# Patient Record
Sex: Male | Born: 1955 | Race: Black or African American | Hispanic: No | Marital: Married | Smoking: Never smoker
Health system: Southern US, Community
[De-identification: ages and names within clinical notes are randomized; demographics above are authoritative.]

## PROBLEM LIST (undated history)

## (undated) DIAGNOSIS — I10 Essential (primary) hypertension: Secondary | ICD-10-CM

## (undated) DIAGNOSIS — E785 Hyperlipidemia, unspecified: Secondary | ICD-10-CM

## (undated) DIAGNOSIS — T7840XA Allergy, unspecified, initial encounter: Secondary | ICD-10-CM

## (undated) DIAGNOSIS — R7303 Prediabetes: Secondary | ICD-10-CM

## (undated) DIAGNOSIS — G4730 Sleep apnea, unspecified: Secondary | ICD-10-CM

## (undated) HISTORY — PX: TOOTH EXTRACTION: SUR596

## (undated) HISTORY — DX: Essential (primary) hypertension: I10

## (undated) HISTORY — DX: Allergy, unspecified, initial encounter: T78.40XA

## (undated) HISTORY — DX: Prediabetes: R73.03

## (undated) HISTORY — PX: APPENDECTOMY: SHX54

## (undated) HISTORY — DX: Hyperlipidemia, unspecified: E78.5

## (undated) HISTORY — PX: VASECTOMY: SHX75

## (undated) HISTORY — DX: Sleep apnea, unspecified: G47.30

---

## 2003-06-21 ENCOUNTER — Ambulatory Visit (HOSPITAL_BASED_OUTPATIENT_CLINIC_OR_DEPARTMENT_OTHER): Admission: RE | Admit: 2003-06-21 | Discharge: 2003-06-21 | Payer: Self-pay | Admitting: Internal Medicine

## 2003-07-30 ENCOUNTER — Ambulatory Visit (HOSPITAL_BASED_OUTPATIENT_CLINIC_OR_DEPARTMENT_OTHER): Admission: RE | Admit: 2003-07-30 | Discharge: 2003-07-30 | Payer: Self-pay | Admitting: Internal Medicine

## 2005-11-01 ENCOUNTER — Encounter: Admission: RE | Admit: 2005-11-01 | Discharge: 2006-01-30 | Payer: Self-pay | Admitting: Gastroenterology

## 2008-11-29 ENCOUNTER — Ambulatory Visit: Payer: Self-pay | Admitting: Internal Medicine

## 2008-11-29 DIAGNOSIS — R0602 Shortness of breath: Secondary | ICD-10-CM | POA: Insufficient documentation

## 2008-11-29 DIAGNOSIS — G4730 Sleep apnea, unspecified: Secondary | ICD-10-CM | POA: Insufficient documentation

## 2008-11-29 LAB — CONVERTED CEMR LAB
Angiotensin 1 Converting Enzyme: 20 units/L (ref 9–67)
Basophils Relative: 0.4 % (ref 0.0–3.0)
Hemoglobin: 15.6 g/dL (ref 13.0–17.0)
Lymphocytes Relative: 36.5 % (ref 12.0–46.0)
Monocytes Relative: 6.7 % (ref 3.0–12.0)
Neutro Abs: 3.4 10*3/uL (ref 1.4–7.7)
RBC: 5.21 M/uL (ref 4.22–5.81)

## 2008-12-07 ENCOUNTER — Ambulatory Visit: Payer: Self-pay | Admitting: Internal Medicine

## 2008-12-09 ENCOUNTER — Ambulatory Visit (HOSPITAL_COMMUNITY): Admission: RE | Admit: 2008-12-09 | Discharge: 2008-12-09 | Payer: Self-pay | Admitting: Internal Medicine

## 2008-12-14 DIAGNOSIS — J309 Allergic rhinitis, unspecified: Secondary | ICD-10-CM | POA: Insufficient documentation

## 2008-12-14 DIAGNOSIS — E785 Hyperlipidemia, unspecified: Secondary | ICD-10-CM | POA: Insufficient documentation

## 2009-01-05 ENCOUNTER — Ambulatory Visit: Payer: Self-pay | Admitting: Internal Medicine

## 2009-01-23 DIAGNOSIS — K219 Gastro-esophageal reflux disease without esophagitis: Secondary | ICD-10-CM | POA: Insufficient documentation

## 2012-02-09 ENCOUNTER — Ambulatory Visit: Payer: Managed Care, Other (non HMO) | Admitting: Internal Medicine

## 2012-02-09 ENCOUNTER — Ambulatory Visit: Payer: Managed Care, Other (non HMO)

## 2012-02-09 VITALS — BP 153/80 | HR 84 | Temp 98.6°F | Resp 16 | Ht 74.0 in | Wt 272.4 lb

## 2012-02-09 DIAGNOSIS — B029 Zoster without complications: Secondary | ICD-10-CM

## 2012-02-09 MED ORDER — VALACYCLOVIR HCL 1 G PO TABS: 1000.0000 mg | ORAL_TABLET | Freq: Three times a day (TID) | ORAL | Status: DC

## 2012-02-09 NOTE — Progress Notes (Signed)
  Subjective:    Patient ID: Dustin Mckee, male    DOB: [birth year], 56 y.o.   MRN: [id number]  HPI2 day history of a painful rash in the right axillary area No known exposure or injury Positive chickenpox in childhood    Review of SystemsOn Lipitor/no history of hypertension     Objective:   Physical ExamBlood pressure 153/80 Skin reveals a vesicular erythematous rash that is tender to touch in the T5 or T6 dermatome This is mainly in the axillary line but skin has slight hypersensitivity throughout the dermatome There is a small lipoma anteriorly in this dermatome        Assessment & Plan:  Problem #1 shingles Valtrex 1 g 3 times a day for 10 days Tylenol or ibuprofen Call if worse  Problem #2 mild hypertension Recheck when well

## 2012-02-11 ENCOUNTER — Telehealth: Payer: Self-pay

## 2012-02-11 MED ORDER — VALACYCLOVIR HCL 1 G PO TABS: 1000.0000 mg | ORAL_TABLET | Freq: Three times a day (TID) | ORAL | Status: AC

## 2012-02-11 NOTE — Telephone Encounter (Signed)
Pt left Valtrex at home and will be out of town for a couple of days would like to have enough to get him through til he get back.    [place], [place] [phone]

## 2012-02-11 NOTE — Telephone Encounter (Signed)
ADVISED PT THAT RX WAS SENT IN 

## 2012-02-11 NOTE — Telephone Encounter (Signed)
Done, and sent to the pharmacy  Alycia Rossetti

## 2012-02-11 NOTE — Telephone Encounter (Signed)
Patient was in over the weekend and was diagnosed with shingles. He is out of town and left his meds at home. He would like to know if he could have enough meds called in to last him for the next two days.

## 2012-06-27 ENCOUNTER — Ambulatory Visit: Payer: Managed Care, Other (non HMO) | Admitting: Emergency Medicine

## 2012-06-27 VITALS — BP 125/79 | HR 73 | Temp 98.5°F | Resp 17 | Ht 74.0 in | Wt 272.0 lb

## 2012-06-27 DIAGNOSIS — A630 Anogenital (venereal) warts: Secondary | ICD-10-CM

## 2012-06-27 NOTE — Progress Notes (Signed)
   Date:  06/27/2012   Name:  Dustin Mckee   DOB:  1956-07-08   MRN:  [id number] Gender: male  Age: 56 y.o.  PCP:  Sissy Hoff, MD    Chief Complaint: Mass   History of Present Illness:  Dustin Mckee is a 56 y.o. pleasant patient who presents with the following:  Long history (years) of growth on his perianal area on right buttock.  Has enlarged recently.  Now having pain.  Denies STD or history of possible exposure.  No history of discharge, dysuria, vesicular eruption.  No lymphadenopathy.  Denies blood in stool or black stool.  Patient Active Problem List  Diagnosis  . HYPERLIPIDEMIA  . ALLERGIC RHINITIS  . GERD  . SLEEP APNEA  . DYSPNEA    No past medical history on file.  No past surgical history on file.  History  Substance Use Topics  . Smoking status: Never Smoker   . Smokeless tobacco: Not on file  . Alcohol Use: Not on file    No family history on file.  Allergies  Allergen Reactions  . Iodine Hives    Medication list has been reviewed and updated.  Current Outpatient Prescriptions on File Prior to Visit  Medication Sig Dispense Refill  . rosuvastatin (CRESTOR) 20 MG tablet Take 20 mg by mouth daily.      Marland Kitchen atorvastatin (LIPITOR) 10 MG tablet Take 10 mg by mouth daily.      . valACYclovir (VALTREX) 1000 MG tablet Take 1 tablet (1,000 mg total) by mouth 3 (three) times daily.  30 tablet  0    Review of Systems:  As per HPI, otherwise negative.    Physical Examination: Filed Vitals:   06/27/12 1447  BP: 125/79  Pulse: 73  Temp: 98.5 F (36.9 C)  Resp: 17   Filed Vitals:   06/27/12 1447  Height: 6\' 2"  (1.88 m)  Weight: 272 lb (123.378 kg)   Body mass index is 34.92 kg/(m^2). Ideal Body Weight: Weight in (lb) to have BMI = 25: 194.3    GEN: WDWN, NAD, Non-toxic, Alert & Oriented x 3 HEENT: Atraumatic, Normocephalic.  Ears and Nose: No external deformity. EXTR: No clubbing/cyanosis/edema NEURO: Normal gait.  PSYCH: Normally  interactive. Conversant. Not depressed or anxious appearing.  Calm demeanor.  Genitalia normal male circumcised Buttocks:  Two lesions one flat and second raised like a cauliflower on right buttock several centimeters from anus.  Not tender.    Assessment and Plan:  Condyloma accuminata   Carmelina Dane, MD

## 2013-02-25 ENCOUNTER — Ambulatory Visit: Payer: BC Managed Care – PPO | Admitting: Family Medicine

## 2013-02-25 VITALS — BP 142/84 | HR 76 | Temp 98.3°F | Resp 16 | Ht 74.0 in | Wt 277.0 lb

## 2013-02-25 DIAGNOSIS — R202 Paresthesia of skin: Secondary | ICD-10-CM

## 2013-02-25 DIAGNOSIS — IMO0001 Reserved for inherently not codable concepts without codable children: Secondary | ICD-10-CM

## 2013-02-25 DIAGNOSIS — R209 Unspecified disturbances of skin sensation: Secondary | ICD-10-CM

## 2013-02-25 DIAGNOSIS — R030 Elevated blood-pressure reading, without diagnosis of hypertension: Secondary | ICD-10-CM

## 2013-02-25 DIAGNOSIS — R351 Nocturia: Secondary | ICD-10-CM

## 2013-02-25 LAB — POCT GLYCOSYLATED HEMOGLOBIN (HGB A1C): Hemoglobin A1C: 5.6

## 2013-02-25 LAB — POCT URINALYSIS DIPSTICK
Leukocytes, UA: NEGATIVE
Protein, UA: NEGATIVE
Urobilinogen, UA: 0.2
pH, UA: 5.5

## 2013-02-25 LAB — GLUCOSE, POCT (MANUAL RESULT ENTRY): POC Glucose: 78 mg/dl (ref 70–99)

## 2013-02-25 LAB — POCT UA - MICROSCOPIC ONLY
Casts, Ur, LPF, POC: NEGATIVE
Mucus, UA: NEGATIVE
Yeast, UA: NEGATIVE

## 2013-02-25 NOTE — Patient Instructions (Addendum)
1.  Check blood pressure once weekly.  Return for blood pressure consistently > 140/90. 2.  Recommend taking Aleve 2-3 times daily for next week.   3.  Return if urinating at night continues.   4.  If tingling in fingers continues, recommend purchasing wrist splint to wear at bedtime for carpal tunnel syndrome.

## 2013-02-25 NOTE — Progress Notes (Signed)
[address]   [place], [place]  [address]   [phone]  Subjective:    Patient ID: Dustin Mckee, male    DOB: 1956/02/08, 57 y.o.   MRN: [id number]  HPI This 57 y.o. male presents for evaluation of the following:    1.  R hand numbness, tingling in fingertips:  Awoke this morning with tingling.  Improved throughout the day.  Mostly thumb, 2nd and 3rd fingers.  4th finger slightly numb.  No weakness.  No neck pain.  No shoulder pain.  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  No nihgttime awakening.  No similar symptoms.    2.  Nocturia: awoke several times last night to urinate; father diabetic.  Nocturia x 4; nocturia x 2 is baseline.  No dysuria, frequency, urgency.  Urinary stream is normal.  No hestinancy.  History of BPH.  No penile discharge; no concerns for STDs; married. Did drink more last night before bedtime.   Last physical Spring 2013.    3.  Blood pressure elevated: baseline  BP runs  120s/80s.  Denies headache, dizziness, blurred vision.  Feels well.    Review of Systems  Constitutional: Negative for fever, chills, diaphoresis and fatigue.  Respiratory: Negative for shortness of breath.   Cardiovascular: Negative for chest pain, palpitations and leg swelling.  Endocrine: Negative for cold intolerance, heat intolerance, polydipsia, polyphagia and polyuria.  Genitourinary: Negative for dysuria, urgency, frequency, hematuria, flank pain, decreased urine volume, discharge, penile swelling, scrotal swelling, difficulty urinating, genital sores, penile pain and testicular pain.  Musculoskeletal: Negative for myalgias, back pain and arthralgias.  Neurological: Positive for numbness. Negative for dizziness, tremors, seizures, syncope, facial asymmetry, speech difficulty, weakness, light-headedness and headaches.        Past Medical History  Diagnosis Date  . Hyperlipidemia   . Allergy     Past Surgical History  Procedure Laterality Date  .  Appendectomy      Prior to Admission medications   Not on File    Allergies  Allergen Reactions  . Iodine Hives    History   Social History  . Marital Status: Married    Spouse Name: N/A    Number of Children: N/A  . Years of Education: N/A   Occupational History  . Not on file.   Social History Main Topics  . Smoking status: Never Smoker   . Smokeless tobacco: Not on file  . Alcohol Use: Not on file  . Drug Use: Not on file  . Sexually Active: Yes    Birth Control/ Protection: None   Other Topics Concern  . Not on file   Social History Narrative  . No narrative on file    Family History  Problem Relation Age of Onset  . Heart disease Mother   . Kidney disease Mother   . Heart disease Father     defibrillator/CHF.  . Diabetes Father   . Heart disease Sister     CHF    Objective:   Physical Exam  Nursing note and vitals reviewed. Constitutional: He is oriented to person, place, and time. He appears well-developed and well-nourished. No distress.  Eyes: Conjunctivae and EOM are normal. Pupils are equal, round, and reactive to light.  Neck: Normal range of motion. Neck supple.  Cardiovascular: Normal rate, regular rhythm and normal heart sounds.   Pulmonary/Chest: Effort normal and breath sounds normal. He has no wheezes. He has no rales.  Abdominal: Soft. Bowel sounds  are normal. He exhibits no distension. There is no tenderness. There is no rebound and no guarding.  Musculoskeletal:       Right wrist: Normal.       Cervical back: Normal. He exhibits normal range of motion, no tenderness, no bony tenderness, no pain and no spasm.       Right hand: Normal.  Tinel and Phalen's negative.  Neurological: He is alert and oriented to person, place, and time. No cranial nerve deficit. He exhibits normal muscle tone. Coordination normal.  Skin: Skin is warm and dry. No rash noted. He is not diaphoretic.  Psychiatric: He has a normal mood and affect. His behavior  is normal. Judgment and thought content normal.   Results for orders placed in visit on 02/25/13  GLUCOSE, POCT (MANUAL RESULT ENTRY)      Result Value Range   POC Glucose 78  70 - 99 mg/dl  POCT GLYCOSYLATED HEMOGLOBIN (HGB A1C)      Result Value Range   Hemoglobin A1C 5.6    POCT URINALYSIS DIPSTICK      Result Value Range   Color, UA yellow     Clarity, UA clear     Glucose, UA neg     Bilirubin, UA neg     Ketones, UA neg     Spec Grav, UA 1.025     Blood, UA trace     pH, UA 5.5     Protein, UA neg     Urobilinogen, UA 0.2     Nitrite, UA neg     Leukocytes, UA Negative    POCT UA - MICROSCOPIC ONLY      Result Value Range   WBC, Ur, HPF, POC 1-2     RBC, urine, microscopic 2-3     Bacteria, U Microscopic neg     Mucus, UA neg     Epithelial cells, urine per micros 0-1     Crystals, Ur, HPF, POC neg     Casts, Ur, LPF, POC neg     Yeast, UA neg         Assessment & Plan:  Nocturia - Plan: POCT glucose (manual entry), POCT glycosylated hemoglobin (Hb A1C), POCT urinalysis dipstick, POCT UA - Microscopic Only  Paresthesias - Plan: POCT glucose (manual entry), POCT glycosylated hemoglobin (Hb A1C), POCT urinalysis dipstick, POCT UA - Microscopic Only  Blood pressure elevated - Plan: POCT glucose (manual entry), POCT glycosylated hemoglobin (Hb A1C), POCT urinalysis dipstick, POCT UA - Microscopic Only    1. Nocturia:  New.  Urine negative; blood sugar normal with normal HgbA1c.   Pt declined prostate exam and PSA.  Likely secondary to excessive fluid intake.  If persists, return for PSA, prostate exam.  No risk factors for STDs.  No daytime urinary symptoms. 2.  Paresthesias R hand:  New.  Median nerve distribution.  Ddx early mild carpal tunnel syndrome versus compression neuropathy. Recommend Aleve bid-tid for next week.  If persists, recommend wrist splint qhs.  If worsens, RTC. 3.  Blood pressure elevated:  New.  Recommend checking blood pressure weekly and RTC  for BP> 140/90.  Recommend weight loss, exercise, low-salt food intake.

## 2013-07-28 ENCOUNTER — Ambulatory Visit: Payer: Managed Care, Other (non HMO) | Admitting: Family Medicine

## 2013-07-28 ENCOUNTER — Encounter: Payer: Self-pay | Admitting: Family Medicine

## 2013-07-28 VITALS — BP 138/78 | HR 65 | Temp 98.5°F | Resp 16 | Ht 74.0 in | Wt 271.0 lb

## 2013-07-28 DIAGNOSIS — R5381 Other malaise: Secondary | ICD-10-CM

## 2013-07-28 DIAGNOSIS — G4730 Sleep apnea, unspecified: Secondary | ICD-10-CM

## 2013-07-28 LAB — POCT CBC
Granulocyte percent: 55.4 %G (ref 37–80)
HCT, POC: 44.7 % (ref 43.5–53.7)
MCH, POC: 29.5 pg (ref 27–31.2)
MCV: 91.1 fL (ref 80–97)
MID (cbc): 0.4 (ref 0–0.9)
POC LYMPH PERCENT: 39 %L (ref 10–50)
Platelet Count, POC: 242 10*3/uL (ref 142–424)
RBC: 4.91 M/uL (ref 4.69–6.13)
RDW, POC: 15.2 %
WBC: 6.8 10*3/uL (ref 4.6–10.2)

## 2013-07-28 LAB — POCT URINALYSIS DIPSTICK
Glucose, UA: NEGATIVE
Ketones, UA: NEGATIVE
Spec Grav, UA: 1.025

## 2013-07-28 LAB — POCT UA - MICROSCOPIC ONLY
Crystals, Ur, HPF, POC: NEGATIVE
Mucus, UA: NEGATIVE

## 2013-07-28 NOTE — Progress Notes (Signed)
[address]   [place], [place]  [address]   [phone]  Subjective:    Patient ID: Dustin Mckee, male    DOB: 09-Jun-1956, 57 y.o.   MRN: [id number]  HPI This 57 y.o. male presents for evaluation of fatigue. Onset 2-3 months ago. Wanted to be checked for Vitamin D deficiency.  Sluggish and fatigue; no motivation to do much.  Travels a lot.  Traveling farther.  Sleeping well.  Non-restorative sleep; changed mask of CPAP.  Last sleep study eight years; weight has increased 40 pounds.  Falling asleep at church.  Bedtime 10:00pm; awakens at 6:00am.  [place]   +major stress at work overall but mood is stable.  No insomnia.   No chest pain, palpitations, SOB, leg swelling. No abdominal pain, nausea, vomiting, diarrhea, bloody stools, or black stools. No previous colonoscopy; scheduled last year and postponed. Nocturia is improved and down to once per night.   Review of Systems  Constitutional: Positive for fatigue. Negative for fever, chills, diaphoresis, activity change, appetite change and unexpected weight change.  Respiratory: Negative for cough, shortness of breath, wheezing and stridor.   Cardiovascular: Negative for chest pain, palpitations and leg swelling.  Gastrointestinal: Negative for nausea, vomiting, abdominal pain, diarrhea, constipation, blood in stool and rectal pain.  Endocrine: Negative for cold intolerance, heat intolerance, polydipsia, polyphagia and polyuria.  Genitourinary: Negative for frequency, hematuria, penile pain and testicular pain.  Musculoskeletal: Negative for myalgias, back pain, joint swelling, arthralgias and gait problem.  Skin: Negative for rash.  Neurological: Negative for dizziness, tremors, seizures, syncope, facial asymmetry, speech difficulty, weakness, light-headedness, numbness and headaches.  Hematological: Negative for adenopathy. Does not bruise/bleed easily.  Psychiatric/Behavioral: Negative for sleep disturbance  and dysphoric mood. The patient is not nervous/anxious.     Past Medical History  Diagnosis Date  . Hyperlipidemia   . Allergy     Past Surgical History  Procedure Laterality Date  . Appendectomy    . Tooth extraction      Prior to Admission medications   Medication Sig Start Date End Date Taking? Authorizing Provider  AMOXICILLIN PO Take by mouth.   Yes Historical Provider, MD  fish oil-omega-3 fatty acids 1000 MG capsule Take by mouth daily.   Yes Historical Provider, MD    Allergies  Allergen Reactions  . Iodine Hives    History   Social History  . Marital Status: Married    Spouse Name: N/A    Number of Children: N/A  . Years of Education: N/A   Occupational History  . Not on file.   Social History Main Topics  . Smoking status: Never Smoker   . Smokeless tobacco: Not on file  . Alcohol Use: Not on file  . Drug Use: Not on file  . Sexual Activity: Yes    Birth Control/ Protection: None   Other Topics Concern  . Not on file   Social History Narrative  . No narrative on file    Family History  Problem Relation Age of Onset  . Heart disease Mother   . Kidney disease Mother   . Heart disease Father     defibrillator/CHF.  . Diabetes Father   . Heart disease Sister     CHF       Objective:   Physical Exam  Nursing note and vitals reviewed. Constitutional: He is oriented to person, place, and time. He appears well-developed and well-nourished. No distress.  HENT:  Head: Normocephalic and  atraumatic.  Right Ear: External ear normal.  Left Ear: External ear normal.  Nose: Nose normal.  Mouth/Throat: Oropharynx is clear and moist.  Eyes: Conjunctivae and EOM are normal. Pupils are equal, round, and reactive to light.  Neck: Normal range of motion. Neck supple. No thyromegaly present.  Cardiovascular: Normal rate, regular rhythm, normal heart sounds and intact distal pulses.  Exam reveals no gallop and no friction rub.   No murmur  heard. Pulmonary/Chest: Effort normal and breath sounds normal. He has no wheezes. He has no rales.  Abdominal: Soft. Bowel sounds are normal. He exhibits no distension and no mass. There is no tenderness. There is no rebound and no guarding.  Lymphadenopathy:    He has no cervical adenopathy.  Neurological: He is alert and oriented to person, place, and time. No cranial nerve deficit. He exhibits normal muscle tone. Coordination normal.  Skin: Skin is warm and dry. No rash noted. He is not diaphoretic.  Psychiatric: He has a normal mood and affect. His behavior is normal. Judgment and thought content normal.   Results for orders placed in visit on 07/28/13  POCT CBC      Result Value Range   WBC 6.8  4.6 - 10.2 K/uL   Lymph, poc 2.7  0.6 - 3.4   POC LYMPH PERCENT 39.0  10 - 50 %L   MID (cbc) 0.4  0 - 0.9   POC MID % 5.6  0 - 12 %M   POC Granulocyte 3.8  2 - 6.9   Granulocyte percent 55.4  37 - 80 %G   RBC 4.91  4.69 - 6.13 M/uL   Hemoglobin 14.5  14.1 - 18.1 g/dL   HCT, POC 16.1  09.6 - 53.7 %   MCV 91.1  80 - 97 fL   MCH, POC 29.5  27 - 31.2 pg   MCHC 32.4  31.8 - 35.4 g/dL   RDW, POC 04.5     Platelet Count, POC 242  142 - 424 K/uL   MPV 8.1  0 - 99.8 fL  POCT URINALYSIS DIPSTICK      Result Value Range   Color, UA yellow     Clarity, UA clear     Glucose, UA neg     Bilirubin, UA neg     Ketones, UA neg     Spec Grav, UA 1.025     Blood, UA trace     pH, UA 6.0     Protein, UA neg     Urobilinogen, UA 0.2     Nitrite, UA neg     Leukocytes, UA Negative    POCT UA - MICROSCOPIC ONLY      Result Value Range   WBC, Ur, HPF, POC 0-1     RBC, urine, microscopic 0-2     Bacteria, U Microscopic trace     Mucus, UA neg     Epithelial cells, urine per micros 0-1     Crystals, Ur, HPF, POC neg     Casts, Ur, LPF, POC neg     Yeast, UA neg     EKG:  Sinus bradycardia at 58; no ST changes.    Assessment & Plan:  Other malaise and fatigue - Plan: EKG 12-Lead, POCT CBC,  POCT urinalysis dipstick, POCT UA - Microscopic Only, Comprehensive metabolic panel, TSH, Vit D  25 hydroxy (rtn osteoporosis monitoring), Vitamin B12  1.  Fatigue:  New onset.  No associated symptoms other than increasing work stressors  and increasing travel with work. Obtain labs.  If labs normal, consider CPAP sleep study to evaluate appropriate pressure of CPAP machine; last sleep study 8 years ago.  Patient expressed understanding.

## 2013-07-28 NOTE — Patient Instructions (Addendum)

## 2013-07-29 LAB — COMPREHENSIVE METABOLIC PANEL
Alkaline Phosphatase: 50 U/L (ref 39–117)
BUN: 20 mg/dL (ref 6–23)
CO2: 30 mEq/L (ref 19–32)
Creat: 1.35 mg/dL (ref 0.50–1.35)
Glucose, Bld: 84 mg/dL (ref 70–99)
Total Bilirubin: 0.4 mg/dL (ref 0.3–1.2)
Total Protein: 7.5 g/dL (ref 6.0–8.3)

## 2013-07-30 LAB — TSH: TSH: 2.75 u[IU]/mL (ref 0.350–4.500)

## 2013-07-30 LAB — VITAMIN D 25 HYDROXY (VIT D DEFICIENCY, FRACTURES): Vit D, 25-Hydroxy: 18 ng/mL — ABNORMAL LOW (ref 30–89)

## 2013-07-30 LAB — VITAMIN B12: Vitamin B-12: 699 pg/mL (ref 211–911)

## 2013-08-07 ENCOUNTER — Telehealth: Payer: Self-pay

## 2013-08-07 NOTE — Telephone Encounter (Signed)
Pt is calling for lab results 

## 2014-01-30 ENCOUNTER — Encounter (HOSPITAL_COMMUNITY): Payer: Self-pay | Admitting: Emergency Medicine

## 2014-01-30 ENCOUNTER — Emergency Department (HOSPITAL_COMMUNITY)
Admission: EM | Admit: 2014-01-30 | Discharge: 2014-01-30 | Disposition: A | Discharge status: Home / Self Care | Payer: Managed Care, Other (non HMO) | Source: Home / Self Care | Attending: Family Medicine | Admitting: Family Medicine

## 2014-01-30 DIAGNOSIS — J309 Allergic rhinitis, unspecified: Secondary | ICD-10-CM

## 2014-01-30 DIAGNOSIS — J302 Other seasonal allergic rhinitis: Secondary | ICD-10-CM

## 2014-01-30 MED ORDER — HYDROCOD POLST-CHLORPHEN POLST 10-8 MG/5ML PO LQCR: 5.0000 mL | Freq: Two times a day (BID) | ORAL | Status: DC | PRN

## 2014-01-30 MED ORDER — FLUTICASONE PROPIONATE 50 MCG/ACT NA SUSP: 1.0000 | Freq: Two times a day (BID) | NASAL | Status: DC

## 2014-01-30 MED ORDER — FEXOFENADINE HCL 180 MG PO TABS: 180.0000 mg | ORAL_TABLET | Freq: Every day | ORAL | Status: DC

## 2014-01-30 NOTE — Discharge Instructions (Signed)
Drink plenty of fluids as discussed, use medicine as prescribed, and mucinex or delsym for cough. Return or see your doctor if further problems °

## 2014-01-30 NOTE — ED Provider Notes (Signed)
CSN: [id number]     Arrival date & time 01/30/14  1028 History   First MD Initiated Contact with Patient 01/30/14 1033     Chief Complaint  Patient presents with  . URI   (Consider location/radiation/quality/duration/timing/severity/associated sxs/prior Treatment) Patient is a 58 y.o. male presenting with URI. The history is provided by the patient.  URI Presenting symptoms: congestion, cough, rhinorrhea and sore throat   Presenting symptoms: no fever   Severity:  Mild Onset quality:  Gradual Duration:  2 days Progression:  Unchanged Chronicity:  New Associated symptoms: sneezing   Associated symptoms: no wheezing   Risk factors: sick contacts   Risk factors: no recent illness   Risk factors comment:  Husband with similar sx,   Past Medical History  Diagnosis Date  . Hyperlipidemia   . Allergy    Past Surgical History  Procedure Laterality Date  . Appendectomy    . Tooth extraction     Family History  Problem Relation Age of Onset  . Heart disease Mother   . Kidney disease Mother   . Heart disease Father     defibrillator/CHF.  . Diabetes Father   . Heart disease Sister     CHF   History  Substance Use Topics  . Smoking status: Never Smoker   . Smokeless tobacco: Not on file  . Alcohol Use: Not on file    Review of Systems  Constitutional: Negative.  Negative for fever.  HENT: Positive for congestion, postnasal drip, rhinorrhea, sneezing and sore throat.   Respiratory: Positive for cough. Negative for shortness of breath and wheezing.   Cardiovascular: Negative.     Allergies  Iodine  Home Medications   Current Outpatient Rx  Name  Route  Sig  Dispense  Refill  . AMOXICILLIN PO   Oral   Take by mouth.         . fexofenadine (ALLEGRA) 180 MG tablet   Oral   Take 1 tablet (180 mg total) by mouth daily.   30 tablet   1   . fish oil-omega-3 fatty acids 1000 MG capsule   Oral   Take by mouth daily.         . fluticasone (FLONASE) 50  MCG/ACT nasal spray   Each Nare   Place 1 spray into both nostrils 2 (two) times daily.   1 g   2    BP 153/98  Pulse 82  Temp(Src) 98.5 F (36.9 C) (Oral)  Resp 17  SpO2 97% Physical Exam  Nursing note and vitals reviewed. Constitutional: He is oriented to person, place, and time. He appears well-developed and well-nourished.  HENT:  Head: Normocephalic.  Right Ear: External ear normal.  Left Ear: External ear normal.  Nose: Mucosal edema and rhinorrhea present.  Mouth/Throat: Oropharynx is clear and moist.  Neck: Normal range of motion. Neck supple.  Cardiovascular: Regular rhythm.   Pulmonary/Chest: Effort normal and breath sounds normal.  Lymphadenopathy:    He has no cervical adenopathy.  Neurological: He is alert and oriented to person, place, and time.  Skin: Skin is warm and dry.    ED Course  Procedures (including critical care time) Labs Review Labs Reviewed - No data to display Imaging Review No results found.   MDM   1. Seasonal allergic rhinitis        Linna HoffJames D Emberley Kral, MD 01/30/14 1100

## 2014-01-30 NOTE — ED Notes (Signed)
Patient complains of head congestion and chest congestion and cough; also states yellow/greenish discharge from nose and from chest; states slight headache. Denies fever/chills. 

## 2014-06-11 ENCOUNTER — Ambulatory Visit (INDEPENDENT_AMBULATORY_CARE_PROVIDER_SITE_OTHER): Payer: Managed Care, Other (non HMO) | Admitting: Family Medicine

## 2014-06-11 ENCOUNTER — Telehealth: Payer: Self-pay | Admitting: *Deleted

## 2014-06-11 VITALS — BP 138/86 | HR 74 | Temp 98.0°F | Resp 16 | Ht 74.0 in | Wt 282.0 lb

## 2014-06-11 DIAGNOSIS — Z0000 Encounter for general adult medical examination without abnormal findings: Secondary | ICD-10-CM

## 2014-06-11 DIAGNOSIS — N139 Obstructive and reflux uropathy, unspecified: Secondary | ICD-10-CM

## 2014-06-11 DIAGNOSIS — E785 Hyperlipidemia, unspecified: Secondary | ICD-10-CM

## 2014-06-11 DIAGNOSIS — E559 Vitamin D deficiency, unspecified: Secondary | ICD-10-CM

## 2014-06-11 DIAGNOSIS — G4730 Sleep apnea, unspecified: Secondary | ICD-10-CM

## 2014-06-11 DIAGNOSIS — N138 Other obstructive and reflux uropathy: Secondary | ICD-10-CM

## 2014-06-11 DIAGNOSIS — Z125 Encounter for screening for malignant neoplasm of prostate: Secondary | ICD-10-CM

## 2014-06-11 DIAGNOSIS — Z1211 Encounter for screening for malignant neoplasm of colon: Secondary | ICD-10-CM

## 2014-06-11 DIAGNOSIS — K219 Gastro-esophageal reflux disease without esophagitis: Secondary | ICD-10-CM

## 2014-06-11 DIAGNOSIS — Z23 Encounter for immunization: Secondary | ICD-10-CM

## 2014-06-11 DIAGNOSIS — N401 Enlarged prostate with lower urinary tract symptoms: Secondary | ICD-10-CM

## 2014-06-11 LAB — POCT UA - MICROSCOPIC ONLY
BACTERIA, U MICROSCOPIC: NEGATIVE
CASTS, UR, LPF, POC: NEGATIVE
CRYSTALS, UR, HPF, POC: NEGATIVE
EPITHELIAL CELLS, URINE PER MICROSCOPY: NEGATIVE
MUCUS UA: NEGATIVE
RBC, URINE, MICROSCOPIC: NEGATIVE
WBC, Ur, HPF, POC: NEGATIVE
Yeast, UA: NEGATIVE

## 2014-06-11 LAB — COMPLETE METABOLIC PANEL WITH GFR
ALT: 58 U/L — ABNORMAL HIGH (ref 0–53)
AST: 29 U/L (ref 0–37)
Albumin: 4.7 g/dL (ref 3.5–5.2)
Alkaline Phosphatase: 52 U/L (ref 39–117)
BILIRUBIN TOTAL: 0.6 mg/dL (ref 0.2–1.2)
BUN: 14 mg/dL (ref 6–23)
CHLORIDE: 102 meq/L (ref 96–112)
CO2: 25 mEq/L (ref 19–32)
Calcium: 9.6 mg/dL (ref 8.4–10.5)
Creat: 1.25 mg/dL (ref 0.50–1.35)
GFR, EST AFRICAN AMERICAN: 74 mL/min
GFR, EST NON AFRICAN AMERICAN: 64 mL/min
GLUCOSE: 98 mg/dL (ref 70–99)
Potassium: 4.5 mEq/L (ref 3.5–5.3)
SODIUM: 137 meq/L (ref 135–145)
TOTAL PROTEIN: 7.2 g/dL (ref 6.0–8.3)

## 2014-06-11 LAB — POCT URINALYSIS DIPSTICK
Bilirubin, UA: NEGATIVE
Glucose, UA: NEGATIVE
KETONES UA: NEGATIVE
Leukocytes, UA: NEGATIVE
Nitrite, UA: NEGATIVE
PROTEIN UA: NEGATIVE
RBC UA: NEGATIVE
SPEC GRAV UA: 1.01
UROBILINOGEN UA: 0.2
pH, UA: 6

## 2014-06-11 LAB — LIPID PANEL
Cholesterol: 244 mg/dL — ABNORMAL HIGH (ref 0–200)
HDL: 45 mg/dL (ref >39)
LDL Cholesterol: 155 mg/dL — ABNORMAL HIGH (ref 0–99)
Total CHOL/HDL Ratio: 5.4 Ratio
Triglycerides: 221 mg/dL — ABNORMAL HIGH (ref <150)
VLDL: 44 mg/dL — ABNORMAL HIGH (ref 0–40)

## 2014-06-11 MED ORDER — TAMSULOSIN HCL 0.4 MG PO CAPS: 0.4000 mg | ORAL_CAPSULE | Freq: Every day | ORAL | Status: DC

## 2014-06-11 NOTE — Patient Instructions (Addendum)
Keeping you healthy  Get these tests  Blood pressure- Have your blood pressure checked once a year by your healthcare provider.  Normal blood pressure is 120/80  Weight- Have your body mass index (BMI) calculated to screen for obesity.  BMI is a measure of body fat based on height and weight. You can also calculate your own BMI at [email or web address].  Cholesterol- Have your cholesterol checked every year.  Diabetes- Have your blood sugar checked regularly if you have high blood pressure, high cholesterol, have a family history of diabetes or if you are overweight.  Screening for Colon Cancer- Colonoscopy starting at age 40.  Screening may begin sooner depending on your family history and other health conditions. Follow up colonoscopy as directed by your Gastroenterologist.  Screening for Prostate Cancer- Both blood work (PSA) and a rectal exam help screen for Prostate Cancer.  Screening begins at age 40 with African-American men and at age 13 with Caucasian men.  Screening may begin sooner depending on your family history.  Take these medicines  Aspirin- One aspirin daily can help prevent Heart disease and Stroke.  Flu shot- Every fall.  Tetanus- Every 10 years.  Zostavax- Once after the age of 56 to prevent Shingles.  Pneumonia shot- Once after the age of 56; if you are younger than 56, ask your healthcare provider if you need a Pneumonia shot.  Take these steps  Don't smoke- If you do smoke, talk to your doctor about quitting.  For tips on how to quit, go to www.smokefree.gov or call 1-800-QUIT-NOW.  Be physically active- Exercise 5 days a week for at least 30 minutes.  If you are not already physically active start slow and gradually work up to 30 minutes of moderate physical activity.  Examples of moderate activity include walking briskly, mowing the yard, dancing, swimming, bicycling, etc.  Eat a healthy diet- Eat a variety of healthy food such as fruits, vegetables, low  fat milk, low fat cheese, yogurt, lean meant, poultry, fish, beans, tofu, etc. For more information go to www.thenutritionsource.org  Drink alcohol in moderation- Limit alcohol intake to less than two drinks a day. Never drink and drive.  Dentist- Brush and floss twice daily; visit your dentist twice a year.  Depression- Your emotional health is as important as your physical health. If you're feeling down, or losing interest in things you would normally enjoy please talk to your healthcare provider.  Eye exam- Visit your eye doctor every year.  Safe sex- If you may be exposed to a sexually transmitted infection, use a condom.  Seat belts- Seat belts can save your life; always wear one.  Smoke/Carbon Monoxide detectors- These detectors need to be installed on the appropriate level of your home.  Replace batteries at least once a year.  Skin cancer- When out in the sun, cover up and use sunscreen 15 SPF or higher.  Violence- If anyone is threatening you, please tell your healthcare provider.  Living Will/ Health care power of attorney- Speak with your healthcare provider and family.  Tdap Vaccine (Tetanus, Diphtheria, Pertussis): What You Need to Know 1. Why get vaccinated? Tetanus, diphtheria and pertussis can be very serious diseases, even for adolescents and adults. Tdap vaccine can protect [place] from these diseases. TETANUS (Lockjaw) causes painful muscle tightening and stiffness, usually all over the body.  It can lead to tightening of muscles in the head and neck so you can't open your mouth, swallow, or sometimes even breathe. Tetanus kills about 1 out  of 5 people who are infected. DIPHTHERIA can cause a thick coating to form in the back of the throat.  It can lead to breathing problems, paralysis, heart failure, and death. PERTUSSIS (Whooping Cough) causes severe coughing spells, which can cause difficulty breathing, vomiting and disturbed sleep.  It can also lead to weight loss,  incontinence, and rib fractures. Up to 2 in 100 adolescents and 5 in 100 adults with pertussis are hospitalized or have complications, which could include pneumonia or death. These diseases are caused by bacteria. Diphtheria and pertussis are spread from person to person through coughing or sneezing. Tetanus enters the body through cuts, scratches, or wounds. Before vaccines, the [place] saw as many as 200,000 cases a year of diphtheria and pertussis, and hundreds of cases of tetanus. Since vaccination began, tetanus and diphtheria have dropped by about 99% and pertussis by about 80%. 2. Tdap vaccine Tdap vaccine can protect adolescents and adults from tetanus, diphtheria, and pertussis. One dose of Tdap is routinely given at age 56 or 56. People who did not get Tdap at that age should get it as soon as possible. Tdap is especially important for health care professionals and anyone having close contact with a baby younger than 12 months. Pregnant women should get a dose of Tdap during every pregnancy, to protect the newborn from pertussis. Infants are most at risk for severe, life-threatening complications from pertussis. A similar vaccine, called Td, protects from tetanus and diphtheria, but not pertussis. A Td booster should be given every 10 years. Tdap may be given as one of these boosters if you have not already gotten a dose. Tdap may also be given after a severe cut or burn to prevent tetanus infection. Your doctor can give you more information. Tdap may safely be given at the same time as other vaccines. 3. Some people should not get this vaccine  If you ever had a life-threatening allergic reaction after a dose of any tetanus, diphtheria, or pertussis containing vaccine, OR if you have a severe allergy to any part of this vaccine, you should not get Tdap. Tell your doctor if you have any severe allergies.  If you had a coma, or long or multiple seizures within 7 days after a childhood  dose of DTP or DTaP, you should not get Tdap, unless a cause other than the vaccine was found. You can still get Td.  Talk to your doctor if you:  have epilepsy or another nervous system problem,  had severe pain or swelling after any vaccine containing diphtheria, tetanus or pertussis,  ever had Guillain-Barr Syndrome (GBS),  aren't feeling well on the day the shot is scheduled. 4. Risks of a vaccine reaction With any medicine, including vaccines, there is a chance of side effects. These are usually mild and go away on their own, but serious reactions are also possible. Brief fainting spells can follow a vaccination, leading to injuries from falling. Sitting or lying down for about 15 minutes can help prevent these. Tell your doctor if you feel dizzy or light-headed, or have vision changes or ringing in the ears. Mild problems following Tdap (Did not interfere with activities)  Pain where the shot was given (about 3 in 4 adolescents or 2 in 3 adults)  Redness or swelling where the shot was given (about 1 person in 5)  Mild fever of at least 100.59F (up to about 1 in 25 adolescents or 1 in 100 adults)  Headache (about 3 or 4  people in 10)  Tiredness (about 1 person in 3 or 4)  Nausea, vomiting, diarrhea, stomach ache (up to 1 in 4 adolescents or 1 in 10 adults)  Chills, body aches, sore joints, rash, swollen glands (uncommon) Moderate problems following Tdap (Interfered with activities, but did not require medical attention)  Pain where the shot was given (about 1 in 5 adolescents or 1 in 100 adults)  Redness or swelling where the shot was given (up to about 1 in 16 adolescents or 1 in 25 adults)  Fever over 102F (about 1 in 100 adolescents or 1 in 250 adults)  Headache (about 3 in 20 adolescents or 1 in 10 adults)  Nausea, vomiting, diarrhea, stomach ache (up to 1 or 3 people in 100)  Swelling of the entire arm where the shot was given (up to about 3 in 100). Severe  problems following Tdap (Unable to perform usual activities; required medical attention)  Swelling, severe pain, bleeding and redness in the arm where the shot was given (rare). A severe allergic reaction could occur after any vaccine (estimated less than 1 in a million doses). 5. What if there is a serious reaction? What should I look for?  Look for anything that concerns you, such as signs of a severe allergic reaction, very high fever, or behavior changes. Signs of a severe allergic reaction can include hives, swelling of the face and throat, difficulty breathing, a fast heartbeat, dizziness, and weakness. These would start a few minutes to a few hours after the vaccination. What should I do?  If you think it is a severe allergic reaction or other emergency that can't wait, call 9-1-1 or get the person to the nearest hospital. Otherwise, call your doctor.  Afterward, the reaction should be reported to the "Vaccine Adverse Event Reporting System" (VAERS). Your doctor might file this report, or you can do it yourself through the VAERS web site at www.vaers.LAgents.no, or by calling 1-9044449431. VAERS is only for reporting reactions. They do not give medical advice.  6. The National Vaccine Injury Compensation Program The [employer] Vaccine Injury Compensation Program (VICP) is a federal program that was created to compensate people who may have been injured by certain vaccines. Persons who believe they may have been injured by a vaccine can learn about the program and about filing a claim by calling 1-201-811-7753 or visiting the VICP website at SpiritualWord.at. 7. How can I learn more?  Ask your doctor.  Call your local or state health department.  Contact the Centers for Disease Control and Prevention (CDC):  Call (507) 510-0266 or visit CDC's website at PicCapture.uy. CDC Tdap Vaccine VIS (03/19/12) Document Released: 04/28/2012 Document Revised: 03/14/2014  Document Reviewed: 02/09/2014 ExitCare Patient Information 2015 [place], [place]. This information is not intended to replace advice given to you by your health care provider. Make sure you discuss any questions you have with your health care provider. Benign Prostatic Hypertrophy The prostate gland is part of the reproductive system of men. A normal prostate is about the size and shape of a walnut. The prostate gland produces a fluid that is mixed with sperm to make semen. This gland surrounds the urethra and is located in front of the rectum and just below the bladder. The bladder is where urine is stored. The urethra is the tube through which urine passes from the bladder to get out of the body. The prostate grows as a man ages. An enlarged prostate not caused by cancer is called benign prostatic hypertrophy (  BPH). An enlarged prostate can press on the urethra. This can make it harder to pass urine. In the early stages of enlargement, the bladder can get by with a narrowed urethra by forcing the urine through. If the problem gets worse, medical or surgical treatment may be required.  This condition should be followed by your health care provider. The accumulation of urine in the bladder can cause infection. Back pressure and infection can progress to bladder damage and kidney (renal) failure. If needed, your health care provider may refer you to a specialist in kidney and prostate disease (urologist). CAUSES  BPH is a common health problem in men older than 50 years. This condition is a normal part of aging. However, not all men will develop problems from this condition. If the enlargement grows away from the urethra, then there will not be any compression of the urethra and resistance to urine flow.If the growth is toward the urethra and compresses it, you will experience difficulty urinating.  SYMPTOMS   Not able to completely empty your bladder.  Getting up often during the night to  urinate.  Need to urinate frequently during the day.  Difficultly starting urine flow.  Decrease in size and strength of your urine stream.  Dribbling after urination.  Pain on urination (more common with infection).  Inability to pass urine. This needs immediate treatment.  The development of a urinary tract infection. DIAGNOSIS  These tests will help your health care provider understand your problem:  A thorough history and physical examination.  A urination history, with the number of times you urinate, the amounts of urine, the strength of the urine stream, and the feeling of emptiness or fullness after urinating.  A postvoid bladder scan that measures any amount of urine that may remain in your bladder after you finish urinating.  Digital rectal exam. In a rectal exam, your health care provider checks your prostate by putting a gloved, lubricated finger into your rectum to feel the back of your prostate gland. This exam detects the size of your gland and abnormal lumps or growths.  Exam of your urine (urinalysis).  Prostate specific antigen (PSA) screening. This is a blood test used to screen for prostate cancer.  Rectal ultrasonography. This test uses sound waves to electronically produce a picture of your prostate gland. TREATMENT  Once symptoms begin, your health care provider will monitor your condition. Of the men with this condition, one third will have symptoms that stabilize, one third will have symptoms that improve, and one third will have symptoms that progress in the first year. Mild symptoms may not need treatment. Simple observation and yearly exams may be all that is required. Medicines and surgery are options for more severe problems. Your health care provider can help you make an informed decision for what is best. Two classes of medicines are available for relief of prostate symptoms:  Medicines that shrink the prostate. This helps relieve symptoms. These  medicines take time to work, and it may be months before any improvement is seen.  Uncommon side effects include problems with sexual function.  Medicines to relax the muscle of the prostate. This also relieves the obstruction by reducing any compression on the urethra.This group of medicines work much faster than those that reduce the size of the prostate gland. Usually, one can experience improvement in days to weeks..  Side effects can include dizziness, fatigue, lightheadedness, and retrograde ejaculation (diminished volume of ejaculate). Several types of surgical treatments are available  for relief of prostate symptoms:  Transurethral resection of the prostate (TURP)--In this treatment, an instrument is inserted through opening at the tip of the penis. It is used to cut away pieces of the inner core of the prostate. The pieces are removed through the same opening of the penis. This removes the obstruction and helps get rid of the symptoms.  Transurethral incision (TUIP)--In this procedure, small cuts are made in the prostate. This lessens the prostates pressure on the urethra.  Transurethral microwave thermotherapy (TUMT)--This procedure uses microwaves to create heat. The heat destroys and removes a small amount of prostate tissue.  Transurethral needle ablation (TUNA)--This is a procedure that uses radio frequencies to do the same as TUMT.  Interstitial laser coagulation (ILC)--This is a procedure that uses a laser to do the same as TUMT and TUNA.  Transurethral electrovaporization (TUVP)--This is a procedure that uses electrodes to do the same as the procedures listed above. SEEK MEDICAL CARE IF:   You develop a fever.  There is unexplained back pain.  Symptoms are not helped by medicines prescribed.  You develop side effects from the medicine you are taking.  Your urine becomes very dark or has a bad smell.  Your lower abdomen becomes distended and you have difficulty  passing your urine. SEEK IMMEDIATE MEDICAL CARE IF:   You are suddenly unable to urinate. This is an emergency. You should be seen immediately.  There are large amounts of blood or clots in the urine.  Your urinary problems become unmanageable.  You develop lightheadedness, severe dizziness, or you feel faint.  You develop moderate to severe low back or flank pain.  You develop chills or fever. Document Released: 10/28/2005 Document Revised: 11/02/2013 Document Reviewed: 05/13/2013 [facility] Patient Information 2015 [place]. This information is not intended to replace advice given to you by your health care provider. Make sure you discuss any questions you have with your health care provider.

## 2014-06-11 NOTE — Telephone Encounter (Signed)
Nope, it's fine

## 2014-06-11 NOTE — Progress Notes (Signed)
Subjective:    Patient ID: Dustin Mckee, male    DOB: Oct 23, 1956, 58 y.o.   MRN: [id number] This chart was scribed for Sherren Mocha, MD by [place], ED Scribe. The patient was seen in [facility]09. The patient's care was started at 10:08 AM.  Chief Complaint  Patient presents with  . Annual Exam    HPI HPI Comments: Dustin Mckee is a 58 y.o. male who presents to the [facility] to receive yearly physical. Pt's blood work from last year looked good except for low Vitamin D levels. Pt reports that he has since been taking a multivitamin a couple times a week but nothing Vitamin D specifc. Pt denies that he has had a tentanus shot in the past 10 years and is to receive one today. Pt is also due for a colonscopy and is to have one scheduled soon. Pt elects to also get his prostate checked today as well. He states he has been felling fine but does report urine urgency and frequency that he states has been going on for a while. Pt states his brother had the same issue and had to be put on Flomax. He reports history of having to get up at night to use the bathroom, sometimes more than once. Pt reports that he travels a lot and has not been able to exercise like he should be but states since his schedule has changed, he should be able to improve on that. Pt does have high cholesterol and family history of heart disease in both of his parents. He denies any chest pain or palpations.   Past Medical History  Diagnosis Date  . Hyperlipidemia   . Allergy    Past Surgical History  Procedure Laterality Date  . Appendectomy    . Tooth extraction     No current outpatient prescriptions on file prior to visit.   No current facility-administered medications on file prior to visit.   Family History  Problem Relation Age of Onset  . Heart disease Mother   . Kidney disease Mother   . Heart disease Father     defibrillator/CHF.  . Diabetes Father   . Heart disease Sister     CHF   History   Social History  .  Marital Status: Married    Spouse Name: N/A    Number of Children: N/A  . Years of Education: N/A   Occupational History  . Not on file.   Social History Main Topics  . Smoking status: Never Smoker   . Smokeless tobacco: Not on file  . Alcohol Use: Not on file  . Drug Use: Not on file  . Sexual Activity: Yes    Birth Control/ Protection: None   Other Topics Concern  . Not on file   Social History Narrative  . No narrative on file       Review of Systems  Constitutional: Negative for fever and chills.  Cardiovascular: Negative for chest pain.  Gastrointestinal: Negative for nausea, vomiting, abdominal pain, diarrhea and constipation.  Genitourinary: Positive for urgency and frequency. Negative for dysuria, hematuria and difficulty urinating.  Psychiatric/Behavioral: Negative for confusion.      BP 138/86  Pulse 74  Temp(Src) 98 F (36.7 C)  Resp 16  Ht 6\' 2"  (1.88 m)  Wt 282 lb (127.914 kg)  BMI 36.19 kg/m2  SpO2 95% Objective:   Physical Exam  Nursing note and vitals reviewed. Constitutional: He is oriented to person, place, and time. He appears well-developed  and well-nourished. No distress.  HENT:  Head: Normocephalic and atraumatic.  Right Ear: External ear normal.  Left Ear: External ear normal.  Nose: Nose normal.  Mouth/Throat: Oropharynx is clear and moist.  Eyes: Conjunctivae and EOM are normal.  Neck: Normal range of motion. Neck supple. No tracheal deviation present. No thyromegaly present.  Cardiovascular: Normal rate, regular rhythm, normal heart sounds and intact distal pulses.   No murmur heard.  2+ DP pulses  Pulmonary/Chest: Effort normal and breath sounds normal. No respiratory distress.  Abdominal: Soft. Bowel sounds are normal. He exhibits no distension and no mass. There is no tenderness. There is no rebound and no guarding.  Genitourinary: Rectum normal, prostate normal and penis normal.  Slight enlargement of prostate.     Musculoskeletal: Normal range of motion. He exhibits no edema.  2+ patellar reflexes  Neurological: He is alert and oriented to person, place, and time. He has normal reflexes.  Skin: Skin is warm and dry. He is not diaphoretic.  Psychiatric: He has a normal mood and affect. His behavior is normal.      Assessment & Plan:  10:14 AM- Treatment plan was discussed with patient who verbalizes understanding and agrees.  Routine general medical examination at a health care facility - Plan: Tdap vaccine greater than or equal to 7yo IM, POCT UA - Microscopic Only, POCT urinalysis dipstick, Vit D  25 hydroxy (rtn osteoporosis monitoring), PSA, CBC with Differential, COMPLETE METABOLIC PANEL WITH GFR, Lipid panel  SLEEP APNEA  HYPERLIPIDEMIA - Plan: Lipid panel  Gastroesophageal reflux disease, esophagitis presence not specified - Plan: Vit D  25 hydroxy (rtn osteoporosis monitoring), COMPLETE METABOLIC PANEL WITH GFR, Lipid panel  Unspecified vitamin D deficiency - Plan: Vit D  25 hydroxy (rtn osteoporosis monitoring)  BPH (benign prostatic hypertrophy) with urinary obstruction  Screening for prostate cancer  Special screening for malignant neoplasms, colon - Plan: Ambulatory referral to Gastroenterology  Meds ordered this encounter  Medications  . tamsulosin (FLOMAX) 0.4 MG CAPS capsule    Sig: Take 1 capsule (0.4 mg total) by mouth daily.    Dispense:  30 capsule    Refill:  3  . ergocalciferol (VITAMIN D2) 50000 UNITS capsule    Sig: Take 1 capsule (50,000 Units total) by mouth once a week.    Dispense:  4 capsule    Refill:  2    I personally performed the services described in this documentation, which was scribed in my presence. The recorded information has been reviewed and considered, and addended by me as needed.  Norberto SorensonEva Shaw, MD MPH   Results for orders placed in visit on 06/11/14  VITAMIN D 25 HYDROXY      Result Value Ref Range   Vit D, 25-Hydroxy 18 (*) 30 - 89 ng/mL   PSA      Result Value Ref Range   PSA 2.10  <=4.00 ng/mL  COMPLETE METABOLIC PANEL WITH GFR      Result Value Ref Range   Sodium 137  135 - 145 mEq/L   Potassium 4.5  3.5 - 5.3 mEq/L   Chloride 102  96 - 112 mEq/L   CO2 25  19 - 32 mEq/L   Glucose, Bld 98  70 - 99 mg/dL   BUN 14  6 - 23 mg/dL   Creat 5.361.25  6.440.50 - 0.341.35 mg/dL   Total Bilirubin 0.6  0.2 - 1.2 mg/dL   Alkaline Phosphatase 52  39 - 117 U/L   AST  29  0 - 37 U/L   ALT 58 (*) 0 - 53 U/L   Total Protein 7.2  6.0 - 8.3 g/dL   Albumin 4.7  3.5 - 5.2 g/dL   Calcium 9.6  8.4 - 16.1 mg/dL   GFR, Est African American 74     GFR, Est Non African American 64    LIPID PANEL      Result Value Ref Range   Cholesterol 244 (*) 0 - 200 mg/dL   Triglycerides 096 (*) <150 mg/dL   HDL 45  >04 mg/dL   Total CHOL/HDL Ratio 5.4     VLDL 44 (*) 0 - 40 mg/dL   LDL Cholesterol 540 (*) 0 - 99 mg/dL  POCT UA - MICROSCOPIC ONLY      Result Value Ref Range   WBC, Ur, HPF, POC neg     RBC, urine, microscopic neg     Bacteria, U Microscopic neg     Mucus, UA neg     Epithelial cells, urine per micros neg     Crystals, Ur, HPF, POC neg     Casts, Ur, LPF, POC neg     Yeast, UA neg    POCT URINALYSIS DIPSTICK      Result Value Ref Range   Color, UA yellow     Clarity, UA clear     Glucose, UA neg     Bilirubin, UA neg     Ketones, UA neg     Spec Grav, UA 1.010     Blood, UA neg     pH, UA 6.0     Protein, UA neg     Urobilinogen, UA 0.2     Nitrite, UA neg     Leukocytes, UA Negative

## 2014-06-11 NOTE — Telephone Encounter (Signed)
[facility] called and stated the purple top was sent with no name on it and  CBC could not be ran.  Would you like for us to call pt back in for redraw and run it here?

## 2014-06-13 LAB — PSA: PSA: 2.1 ng/mL (ref <=4.00)

## 2014-06-13 LAB — VITAMIN D 25 HYDROXY (VIT D DEFICIENCY, FRACTURES): VIT D 25 HYDROXY: 18 ng/mL — AB (ref 30–89)

## 2014-06-14 ENCOUNTER — Encounter: Payer: Self-pay | Admitting: Family Medicine

## 2014-06-14 DIAGNOSIS — E559 Vitamin D deficiency, unspecified: Secondary | ICD-10-CM | POA: Insufficient documentation

## 2014-06-14 DIAGNOSIS — R350 Frequency of micturition: Secondary | ICD-10-CM

## 2014-06-14 DIAGNOSIS — N401 Enlarged prostate with lower urinary tract symptoms: Secondary | ICD-10-CM | POA: Insufficient documentation

## 2014-06-14 MED ORDER — ERGOCALCIFEROL 1.25 MG (50000 UT) PO CAPS: 50000.0000 [IU] | ORAL_CAPSULE | ORAL | Status: DC

## 2014-08-05 ENCOUNTER — Ambulatory Visit (INDEPENDENT_AMBULATORY_CARE_PROVIDER_SITE_OTHER): Payer: 59 | Admitting: Podiatrist

## 2014-08-05 ENCOUNTER — Encounter: Payer: Self-pay | Admitting: Podiatrist

## 2014-08-05 VITALS — BP 152/80 | HR 71 | Resp 16 | Ht 75.0 in | Wt 270.0 lb

## 2014-08-05 DIAGNOSIS — B351 Tinea unguium: Secondary | ICD-10-CM

## 2014-08-05 MED ORDER — EFINACONAZOLE 10 % EX SOLN: 1.0000 [drp] | Freq: Every day | CUTANEOUS | Status: DC

## 2014-08-05 NOTE — Progress Notes (Signed)
   Subjective:    Patient ID: Dustin Mckee, male    DOB: 12-Jul-1956, 58 y.o.   MRN: [id number]  HPI Comments: "I have a problem with the nail"  Patient c/o thick, dark nail on the 1st toe right foot, medial corner for a couple of months. He states that it does get tender sometimes. He has been using fungicure topically. No help.     Review of Systems  All other systems reviewed and are negative.      Objective:   Physical Exam Patient is awake, alert, and oriented x 3.  In no acute distress.  Vascular status is intact with palpable pedal pulses at 2/4 DP and PT bilateral and capillary refill time within normal limits. Neurological sensation is also intact bilaterally via Semmes Weinstein monofilament at 5/5 sites. Light touch, vibratory sensation, Achilles tendon reflex is intact. Dermatological exam reveals skin color, turger and texture as normal. No open lesions present.  Musculature intact with dorsiflexion, plantarflexion, inversion, eversion.  Right hallux nail has a fungal appearance on the medial and proximal nail fold.  Yellow brown discoloration is present and dystrophy is noted.      Assessment & Plan:  Right hallux nail onychomycosis  Plan:  Discussed options and alternatives-- took a sample of the toenail for culture.  Will start him on jublia.  Will consider laser therapy in 3 months if there is no improvement with topicals

## 2014-08-05 NOTE — Patient Instructions (Signed)
I am calling you in a medication called Jublia to a pharmacy that will be able to get you the best price on this particular medication-- it is called [facility].  They will call to confirm that you are interested in the topical therapy-- they are located out of [place].  If you do not hear from them within 2-3 days, please let our office know.  You apply the topical to unpolished nails daily.     Onychomycosis/Fungal Toenails  WHAT IS IT? An infection that lies within the keratin of your nail plate that is caused by a fungus.  WHY ME? Fungal infections affect all ages, sexes, races, and creeds.  There may be many factors that predispose you to a fungal infection such as age, coexisting medical conditions such as diabetes, or an autoimmune disease; stress, medications, fatigue, genetics, etc.  Bottom line: fungus thrives in a warm, moist environment and your shoes offer such a location.  IS IT CONTAGIOUS? Theoretically, yes.  You do not want to share shoes, nail clippers or files with someone who has fungal toenails.  Walking around barefoot in the same room or sleeping in the same bed is unlikely to transfer the organism.  It is important to realize, however, that fungus can spread easily from one nail to the next on the same foot.  HOW DO WE TREAT THIS?  There are several ways to treat this condition.  Treatment may depend on many factors such as age, medications, pregnancy, liver and kidney conditions, etc.  It is best to ask your doctor which options are available to you.  1. No treatment.   Unlike many other medical concerns, you can live with this condition.  However for many people this can be a painful condition and may lead to ingrown toenails or a bacterial infection.  It is recommended that you keep the nails cut short to help reduce the amount of fungal nail. 2. Topical treatment.   About 40-50% effective, topicals require daily application for approximately 9 to 12 months  or until an entirely new nail has grown out.  Oral antifungal medications.  With an 80-90% cure rate, the most common oral medication requires 3 to 4 months of therapy and stays in your system for a year as the new nail grows out.  Oral antifungal medications do require blood work to make sure it is a safe drug for you.  A liver function panel will be performed prior to starting the medication and after the first month of treatment.  It is important to have the blood work performed to avoid any harmful side effects. This medication can have harmful side effects and isn't recommended for all patients, especially those on multiple medications. 3. Permanent Nail Avulsion.  Removing the entire nail so that a new nail will not grow back. 4. Laser-- about 50% effective. A good adjunct for other treatment options such as topicals or oral therapies.  5. Oral medication.  An effective way to treat fungus-- liver function panel required prior to treatment.

## 2014-09-01 ENCOUNTER — Other Ambulatory Visit: Payer: Self-pay | Admitting: Family Medicine

## 2014-09-05 ENCOUNTER — Encounter: Payer: Self-pay | Admitting: Family Medicine

## 2014-09-05 DIAGNOSIS — E785 Hyperlipidemia, unspecified: Secondary | ICD-10-CM | POA: Insufficient documentation

## 2014-10-07 ENCOUNTER — Other Ambulatory Visit: Payer: Self-pay | Admitting: Family Medicine

## 2015-01-19 ENCOUNTER — Other Ambulatory Visit: Payer: Self-pay | Admitting: Physician Assistant

## 2015-02-13 ENCOUNTER — Telehealth: Payer: Self-pay | Admitting: *Deleted

## 2015-02-13 MED ORDER — JUBLIA 10 % EX SOLN: 1.0000 [drp] | Freq: Every day | CUTANEOUS | Status: DC

## 2015-02-13 NOTE — Telephone Encounter (Signed)
Patient called stating that [facility] did not participate with the Jublia program any longer and needs refill. [facility]. Informed patient.

## 2015-02-20 ENCOUNTER — Telehealth: Payer: Self-pay | Admitting: *Deleted

## 2015-02-20 NOTE — Telephone Encounter (Signed)
"  Patient is requesting prescription for Jublia.  He was getting it through a company called [facility].  They told him they no longer fill it."  Will see what Dr. Irving ShowsEgerton recommends on Wednesday.

## 2015-02-20 NOTE — Telephone Encounter (Signed)
[facility] called and stated that mr Aniketh needs a prescription for jublia , that [facility] is no longer filling jublia.  It looks like ashley [facility].

## 2015-02-23 NOTE — Telephone Encounter (Signed)
Print out rx and send to [name]-- also send a savings card and tell him to activate it.  Let him know [facility] has taken over for [facility].  thx

## 2015-02-23 NOTE — Telephone Encounter (Signed)
I called patient to inform him about [facility] program for Jublia.  "[facility]  Okay, how much did you pay?  "I paid $40.  Thanks for calling me."

## 2015-02-23 NOTE — Telephone Encounter (Signed)
i Worthy Flankdont' think [facility] will [facility]?  They are [facility].   Sorry, forward original note to delydia-- i hit send before putting her name in.  thx!

## 2015-03-08 ENCOUNTER — Ambulatory Visit (INDEPENDENT_AMBULATORY_CARE_PROVIDER_SITE_OTHER): Payer: 59 | Admitting: Family Medicine

## 2015-03-08 VITALS — BP 139/80 | HR 71 | Temp 98.4°F | Resp 16 | Ht 74.5 in | Wt 281.6 lb

## 2015-03-08 DIAGNOSIS — R030 Elevated blood-pressure reading, without diagnosis of hypertension: Secondary | ICD-10-CM

## 2015-03-08 DIAGNOSIS — IMO0001 Reserved for inherently not codable concepts without codable children: Secondary | ICD-10-CM

## 2015-03-08 DIAGNOSIS — E559 Vitamin D deficiency, unspecified: Secondary | ICD-10-CM | POA: Diagnosis not present

## 2015-03-08 DIAGNOSIS — N400 Enlarged prostate without lower urinary tract symptoms: Secondary | ICD-10-CM | POA: Diagnosis not present

## 2015-03-08 MED ORDER — VITAMIN D (ERGOCALCIFEROL) 1.25 MG (50000 UNIT) PO CAPS: 50000.0000 [IU] | ORAL_CAPSULE | ORAL | Status: DC

## 2015-03-08 MED ORDER — TAMSULOSIN HCL 0.4 MG PO CAPS: 0.4000 mg | ORAL_CAPSULE | Freq: Every day | ORAL | Status: DC

## 2015-03-08 NOTE — Progress Notes (Signed)
Chief Complaint:  Chief Complaint  Patient presents with  . Medication Refill    Flomax. No reported side effects per pt.    HPI: Dustin Mckee is a 59 y.o. male who is here for  refill on his Flomax. He was given Flomax about a year ago for BPH and has done well on it. When he is not on the Flomax he urinates about 2-3 times nightly. But if he is taking the Flomax and he is up about once nightly or not at all. He has had better flow with the Flomax as well. Currently he has been out of his Flomax for about a week and he is been getting up more frequently in his urine stream has been very weak. His PSA was done in August 2015 on his annual visit and was normal. He denies any other urinary symptoms at this time.  He has a history of elevated blood pressures when he is in our office but has been taking his blood pressures at home and they run less than 140/90. He does have a history of sleep apnea. He he has OSA on CPAP. He uses his CPAP machine regular. 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 He has now since returned to [place] and is no longer traveling. He is hoping to improve his diet and exercise.  He has a history of vitamin D deficiency. Last vitamin D level was taken in August 2015 and was 18. Again he has been without his medications. There is no point in getting blood work today. He will return for his annual visit in August 2016 with Dr. Clelia CroftShaw and get that rechecked.  BP Readings from Last 3 Encounters:  03/08/15 139/80  08/05/14 152/80  06/11/14 138/86    Past Medical History  Diagnosis Date  . Hyperlipidemia   . Allergy    Past Surgical History  Procedure Laterality Date  . Appendectomy    . Tooth extraction     History   Social History  . Marital Status: Married    Spouse Name: N/A  . Number of Children: N/A  . Years of Education: N/A    Social History Main Topics  . Smoking status: Never Smoker   . Smokeless tobacco: Not on file  . Alcohol Use: Not on file  . Drug Use: Not on file  . Sexual Activity: Yes    Birth Control/ Protection: None   Other Topics Concern  . None   Social History Narrative   Family History  Problem Relation Age of Onset  . Heart disease Mother   . Kidney disease Mother   . Heart disease Father     defibrillator/CHF.  . Diabetes Father   . Heart disease Sister     CHF   Allergies  Allergen Reactions  . Iodine Hives   Prior to Admission medications   Medication Sig Start Date End Date Taking? Authorizing Provider  JUBLIA 10 % SOLN Apply 1 drop topically daily. 02/13/15  Yes Delories HeinzKathryn P Egerton, DPM  tamsulosin (FLOMAX) 0.4 MG CAPS capsule Take 1 capsule (0.4 mg total) by mouth daily. PATIENT NEEDS OFFICE VISIT FOR ADDITIONAL REFILLS 01/19/15  Yes Morrell RiddleSarah L Weber, PA-C  Vitamin D, Ergocalciferol, (DRISDOL) 50000 UNITS CAPS capsule TAKE 1 CAPSULE BY MOUTH ONCE A WEEK Patient not taking: Reported on 03/08/2015 09/01/14  Sherren Mocha, MD     ROS: The patient denies fevers, chills, night sweats, unintentional weight loss, chest pain, palpitations, wheezing, dyspnea on exertion, nausea, vomiting, abdominal pain, dysuria, hematuria, melena, numbness, weakness, or tingling.   All other systems have been reviewed and were otherwise negative with the exception of those mentioned in the HPI and as above.    PHYSICAL EXAM: Filed Vitals:   03/08/15 1921  BP: 139/80  Pulse: 71  Temp: 98.4 F (36.9 C)  Resp: 16   Filed Vitals:   03/08/15 1921  Height: 6' 2.5" (1.892 m)  Weight: 281 lb 9.6 oz (127.733 kg)   Body mass index is 35.68 kg/(m^2).  General: Alert, no acute distress HEENT:  Normocephalic, atraumatic, oropharynx patent. EOMI, PERRLA Cardiovascular:  Regular rate and rhythm, no rubs murmurs or gallops.  No Carotid bruits, radial pulse intact. No pedal edema.  Respiratory: Clear to  auscultation bilaterally.  No wheezes, rales, or rhonchi.  No cyanosis, no use of accessory musculature GI: No organomegaly, abdomen is soft and non-tender, positive bowel sounds.  No masses. Skin: No rashes. Neurologic: Facial musculature symmetric. Psychiatric: Patient is appropriate throughout our interaction. Lymphatic: No cervical lymphadenopathy Musculoskeletal: Gait intact.   LABS: Results for orders placed or performed in visit on 06/11/14  Vit D  25 hydroxy (rtn osteoporosis monitoring)  Result Value Ref Range   Vit D, 25-Hydroxy 18 (L) 30 - 89 ng/mL  PSA  Result Value Ref Range   PSA 2.10 <=4.00 ng/mL  COMPLETE METABOLIC PANEL WITH GFR  Result Value Ref Range   Sodium 137 135 - 145 mEq/L   Potassium 4.5 3.5 - 5.3 mEq/L   Chloride 102 96 - 112 mEq/L   CO2 25 19 - 32 mEq/L   Glucose, Bld 98 70 - 99 mg/dL   BUN 14 6 - 23 mg/dL   Creat 1.61 0.96 - 0.45 mg/dL   Total Bilirubin 0.6 0.2 - 1.2 mg/dL   Alkaline Phosphatase 52 39 - 117 U/L   AST 29 0 - 37 U/L   ALT 58 (H) 0 - 53 U/L   Total Protein 7.2 6.0 - 8.3 g/dL   Albumin 4.7 3.5 - 5.2 g/dL   Calcium 9.6 8.4 - 40.9 mg/dL   GFR, Est African American 74 mL/min   GFR, Est Non African American 64 mL/min  Lipid panel  Result Value Ref Range   Cholesterol 244 (H) 0 - 200 mg/dL   Triglycerides 811 (H) <150 mg/dL   HDL 45 >91 mg/dL   Total CHOL/HDL Ratio 5.4 Ratio   VLDL 44 (H) 0 - 40 mg/dL   LDL Cholesterol 478 (H) 0 - 99 mg/dL  POCT UA - Microscopic Only  Result Value Ref Range   WBC, Ur, HPF, POC neg    RBC, urine, microscopic neg    Bacteria, U Microscopic neg    Mucus, UA neg    Epithelial cells, urine per micros neg    Crystals, Ur, HPF, POC neg    Casts, Ur, LPF, POC neg    Yeast, UA neg   POCT urinalysis dipstick  Result Value Ref Range   Color, UA yellow    Clarity, UA clear    Glucose, UA neg    Bilirubin, UA neg    Ketones, UA neg    Spec Grav, UA 1.010    Blood, UA neg    pH, UA 6.0    Protein,  UA neg    Urobilinogen, UA 0.2  Nitrite, UA neg    Leukocytes, UA Negative      EKG/XRAY:   Primary read interpreted by Dr. Conley Rolls at [facility]   ASSESSMENT/PLAN: Encounter Diagnoses  Name Primary?  . BPH (benign prostatic hyperplasia) Yes  . Elevated blood pressure   . Vitamin D deficiency    Refill Flomax. Patient advised to take his medications. Continue with diet and exercise. He will return for annual visit with Dr. Clelia Croft in August 2016. At that time he will get his vitamin D and also his cholesterol levels rechecked. He is already eaten today. Follow-up as needed otherwise in August  Gross sideeffects, risk and benefits, and alternatives of medications d/w patient. Patient is aware that all medications have potential sideeffects and we are unable to predict every sideeffect or drug-drug interaction that may occur.  Hamilton Capri PHUONG, DO 03/08/2015 9:14 PM

## 2015-08-08 ENCOUNTER — Ambulatory Visit (INDEPENDENT_AMBULATORY_CARE_PROVIDER_SITE_OTHER): Payer: 59 | Admitting: Physician Assistant

## 2015-08-08 VITALS — BP 128/80 | HR 70 | Temp 98.3°F | Resp 18 | Ht 74.5 in | Wt 270.0 lb

## 2015-08-08 DIAGNOSIS — M62830 Muscle spasm of back: Secondary | ICD-10-CM

## 2015-08-08 DIAGNOSIS — R0789 Other chest pain: Secondary | ICD-10-CM | POA: Diagnosis not present

## 2015-08-08 MED ORDER — CYCLOBENZAPRINE HCL 10 MG PO TABS: 10.0000 mg | ORAL_TABLET | Freq: Three times a day (TID) | ORAL | Status: DC | PRN

## 2015-08-08 MED ORDER — IBUPROFEN 600 MG PO TABS: 600.0000 mg | ORAL_TABLET | Freq: Three times a day (TID) | ORAL | Status: DC | PRN

## 2015-08-08 NOTE — Progress Notes (Signed)
Subjective:    Patient ID: Dustin Mckee, male    DOB: 08-29-1956, 59 y.o.   MRN: [id number]  HPI Patient presents for back and chest pain that have been present for the past 2 days following changing out toilets in home. Had to carry heavy toilets and had boxes pressed against chest. Believes that he did use proper lifting techniques, but was carrying an awkward shape. Feels like he has a nodule on chest where pain is focally located and does not radiate and is intermittent. Feels achy in quality and denies fever, drainage, or erythema. Back pain is felt right under left should blade and does not radiate. Pain is achy in quality and is low on the pain scale. Has not tried any medications to alleviate pain. Denies SOB, edema, or cough. Denies h/o MI or HTN. Positive h/o GERD that is not currently an issue. Has never smoked cigarrettes and denies illicit drug use. Denies swelling or redness of chest or back. Med allergy iodine.   Review of Systems As noted above.    Objective:   Physical Exam  Constitutional: He is oriented to person, place, and time. He appears well-developed and well-nourished. No distress.  Blood pressure 128/80, pulse 70, temperature 98.3 F (36.8 C), temperature source Oral, resp. rate 18, height 6' 2.5" (1.892 m), weight 270 lb (122.471 kg), SpO2 98 %.  HENT:  Head: Normocephalic and atraumatic.  Right Ear: External ear normal.  Left Ear: External ear normal.  Eyes: Conjunctivae are normal. Pupils are equal, round, and reactive to light. Right eye exhibits no discharge. Left eye exhibits no discharge. No scleral icterus.  Neck: Normal range of motion. Neck supple. No JVD present.  Cardiovascular: Normal rate, regular rhythm, normal heart sounds and intact distal pulses.  Exam reveals no gallop and no friction rub.   No murmur heard. Pulmonary/Chest: Effort normal and breath sounds normal. No respiratory distress. He has no wheezes. He has no rales. He exhibits  tenderness (Has promenient xiphoid process). He exhibits no mass, no laceration, no edema, no deformity and no swelling.    Abdominal: Soft. Bowel sounds are normal. He exhibits no distension. There is no tenderness. There is no rebound and no guarding.  Musculoskeletal: Normal range of motion. He exhibits no edema or tenderness.       Cervical back: Normal.       Thoracic back: He exhibits pain. He exhibits normal range of motion, no tenderness, no bony tenderness, no swelling, no edema, no deformity, no laceration, no spasm and normal pulse.       Lumbar back: Normal.       Back:  Lymphadenopathy:    He has no cervical adenopathy.  Neurological: He is alert and oriented to person, place, and time. He has normal reflexes. No cranial nerve deficit. He exhibits normal muscle tone. Coordination normal.  Skin: Skin is warm and dry. No rash noted. He is not diaphoretic. No erythema. No pallor.  Psychiatric: He has a normal mood and affect. His behavior is normal. Judgment and thought content normal.       Assessment & Plan:  1. Muscle spasm of back 2. Other chest pain Musculature over xiphoid process likely irritated secondary to having toilet pressed against area for extended period of time. Should alternate ice and heating pad to affected areas 3-4x daily.  - ibuprofen (ADVIL,MOTRIN) 600 MG tablet; Take 1 tablet (600 mg total) by mouth every 8 (eight) hours as needed.  Dispense: 30 tablet;  Refill: 0 - cyclobenzaprine (FLEXERIL) 10 MG tablet; Take 1 tablet (10 mg total) by mouth 3 (three) times daily as needed for muscle spasms.  Dispense: 30 tablet; Refill: 0   Tishira Brewington PA-C  [facility] 08/08/2015 11:21 AM

## 2015-08-08 NOTE — Patient Instructions (Signed)
Can alternate ice and heating pad 3-4x daily for 15-20 minutes.

## 2015-08-22 ENCOUNTER — Encounter: Payer: 59 | Admitting: Physician Assistant

## 2015-08-23 ENCOUNTER — Encounter: Payer: Self-pay | Admitting: Physician Assistant

## 2015-08-23 ENCOUNTER — Ambulatory Visit (INDEPENDENT_AMBULATORY_CARE_PROVIDER_SITE_OTHER): Payer: 59 | Admitting: Physician Assistant

## 2015-08-23 VITALS — BP 153/83 | HR 71 | Temp 98.0°F | Resp 16 | Ht 74.0 in | Wt 269.0 lb

## 2015-08-23 DIAGNOSIS — IMO0001 Reserved for inherently not codable concepts without codable children: Secondary | ICD-10-CM

## 2015-08-23 DIAGNOSIS — Z0000 Encounter for general adult medical examination without abnormal findings: Secondary | ICD-10-CM

## 2015-08-23 DIAGNOSIS — R030 Elevated blood-pressure reading, without diagnosis of hypertension: Secondary | ICD-10-CM | POA: Diagnosis not present

## 2015-08-23 DIAGNOSIS — Z23 Encounter for immunization: Secondary | ICD-10-CM

## 2015-08-23 DIAGNOSIS — Z87898 Personal history of other specified conditions: Secondary | ICD-10-CM | POA: Diagnosis not present

## 2015-08-23 DIAGNOSIS — Z8669 Personal history of other diseases of the nervous system and sense organs: Secondary | ICD-10-CM

## 2015-08-23 DIAGNOSIS — Z139 Encounter for screening, unspecified: Secondary | ICD-10-CM | POA: Diagnosis not present

## 2015-08-23 LAB — COMPLETE METABOLIC PANEL WITH GFR
ALT: 37 U/L (ref 9–46)
AST: 24 U/L (ref 10–35)
Albumin: 4.7 g/dL (ref 3.6–5.1)
Alkaline Phosphatase: 54 U/L (ref 40–115)
BILIRUBIN TOTAL: 0.6 mg/dL (ref 0.2–1.2)
BUN: 15 mg/dL (ref 7–25)
CO2: 28 mmol/L (ref 20–31)
CREATININE: 1.14 mg/dL (ref 0.70–1.33)
Calcium: 9.3 mg/dL (ref 8.6–10.3)
Chloride: 103 mmol/L (ref 98–110)
GFR, EST AFRICAN AMERICAN: 82 mL/min (ref >=60)
GFR, EST NON AFRICAN AMERICAN: 71 mL/min (ref >=60)
Glucose, Bld: 96 mg/dL (ref 65–99)
Potassium: 4.2 mmol/L (ref 3.5–5.3)
Sodium: 140 mmol/L (ref 135–146)
TOTAL PROTEIN: 7.2 g/dL (ref 6.1–8.1)

## 2015-08-23 LAB — LIPID PANEL
CHOLESTEROL: 230 mg/dL — AB (ref 125–200)
HDL: 48 mg/dL (ref >=40)
LDL Cholesterol: 148 mg/dL — ABNORMAL HIGH (ref <130)
Total CHOL/HDL Ratio: 4.8 Ratio (ref <=5.0)
Triglycerides: 169 mg/dL — ABNORMAL HIGH (ref <150)
VLDL: 34 mg/dL — ABNORMAL HIGH (ref <30)

## 2015-08-23 LAB — POCT URINALYSIS DIP (MANUAL ENTRY)
BILIRUBIN UA: NEGATIVE
Blood, UA: NEGATIVE
Glucose, UA: NEGATIVE
Ketones, POC UA: NEGATIVE
LEUKOCYTES UA: NEGATIVE
Nitrite, UA: NEGATIVE
Protein Ur, POC: NEGATIVE
SPEC GRAV UA: 1.015
Urobilinogen, UA: 0.2
pH, UA: 7

## 2015-08-23 LAB — HEPATITIS B SURFACE ANTIGEN: Hepatitis B Surface Ag: NEGATIVE

## 2015-08-23 LAB — HEPATITIS B SURFACE ANTIBODY, QUANTITATIVE: HEPATITIS B-POST: 0 m[IU]/mL

## 2015-08-23 LAB — POCT GLYCOSYLATED HEMOGLOBIN (HGB A1C): HEMOGLOBIN A1C: 5.5

## 2015-08-23 LAB — HEPATITIS C ANTIBODY: HCV Ab: NEGATIVE

## 2015-08-23 NOTE — Patient Instructions (Signed)
Exercise improves every system in the body.  It lowers the risk of heart disease, decreases blood pressure, reduces the symptoms of depression and anxiety, and lowers blood sugar. To receive these benefits, try to get 150 minutes of planned exercise each week.  You can break this 150 minutes up however you like.  For instance, you can perform 30 minutes of brisk walking 5 days a week, or perform 50 minutes 3 days a week.  If you don't like walking, or can't find a safe place to walk, find another way to move that you can enjoy.  Exercise tapes, cycling, stair climbing, swimming, or a combination will be just as good as a walking program. To ensure the proper intensity, you can use the talk test. Essentially, you should be able to carry on a conversation, but you should have to take short breaks from the conversation in order catch your breath.  

## 2015-08-23 NOTE — Progress Notes (Signed)
08/23/2015 at 4:12 PM  Dustin Mckee / DOB: 08-11-1956 / MRN: [id number]  The patient has HYPERLIPIDEMIA; ALLERGIC RHINITIS; GERD; SLEEP APNEA; DYSPNEA; Unspecified vitamin D deficiency; BPH (benign prostatic hypertrophy) with urinary obstruction; and Hyperlipidemia on his problem list.  SUBJECTIVE  Dustin Mckee is a 59 y.o. male who complains of need for physical exam. He is a never smoker.  Denies illicit drugs and alcohol.  With regard to last colonoscopy he received this last year and the surgeon found "a couple of polyps" and per pt's reports the pathology was negative. He was advised to return in five years.   With regard to ASA he takes a baby ASA on most daily. He lives with his wife and 22 yo daughter. 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  He reports that he is sedentary.  He has a history of sleep apnea and this was evaluated 10 years ago.  He worries that his machine may not be working optimally.  He does use his machine every night and does not leave home without it.    He is worried about his BP measurement today.  He denies a history of HTN, leg and abdominal swelling, HA, weakness and changes in vision.  Reports that the last time he was here his BP was in the 120s systolic.  He has a monitor at home but does not use it.    Depression screen PHQ 2/9 08/08/2015  Decreased Interest 0  Down, Depressed, Hopeless 0  PHQ - 2 Score 0     Immunization History  Administered Date(s) Administered  . Influenza,inj,Quad PF,36+ Mos 08/23/2015  . Tdap 06/11/2014    He  has a past medical history of Hyperlipidemia and Allergy.    Medications reviewed and updated by myself where necessary, and exist elsewhere in the encounter.   Mr. Sames is allergic to iodine. He  reports that he has never smoked. He has never used smokeless tobacco. He reports that he does not drink alcohol or use illicit drugs. He  reports that he currently engages in sexual activity. He reports using the  following method of birth control/protection: None. The patient  has past surgical history that includes Appendectomy and Tooth extraction.  His family history includes Diabetes in his father; Heart disease in his father, mother, and sister; Kidney disease in his mother.  Review of Systems  Constitutional: Negative for fever and chills.  Respiratory: Negative for shortness of breath.   Cardiovascular: Negative for chest pain.  Gastrointestinal: Negative for nausea and abdominal pain.  Genitourinary: Negative.   Skin: Negative for rash.  Neurological: Negative for dizziness and headaches.    OBJECTIVE  His  height is  (1.88 m) and weight is 269 lb (122.018 kg). His temperature is 98 F (36.7 C). His blood pressure is 153/83 and his pulse is 71. His respiration is 16.  The patient's body mass index is 34.52 kg/(m^2).  Physical Exam  Vitals reviewed. Constitutional: He is oriented to person, place, and time. He appears well-developed. No distress.  Eyes: EOM are normal. Pupils are equal, round, and reactive to light. No scleral icterus.  Neck: Normal range of motion.  Cardiovascular: Normal rate and regular rhythm.   Respiratory: Effort normal and breath sounds normal.  GI: He exhibits no distension.  Musculoskeletal: Normal range of motion.  Neurological: He is alert and oriented to person, place, and time. No cranial nerve deficit.  Skin: Skin is warm and dry. No  rash noted. He is not diaphoretic.  Psychiatric: He has a normal mood and affect.    Results for orders placed or performed in visit on 08/23/15 (from the past 24 hour(s))  POCT urinalysis dipstick     Status: Abnormal   Collection Time: 08/23/15  3:23 PM  Result Value Ref Range   Color, UA other (A) yellow   Clarity, UA clear clear   Glucose, UA negative negative   Bilirubin, UA negative negative   Ketones, POC UA negative negative   Spec Grav, UA 1.015    Blood, UA negative negative   pH, UA 7.0    Protein  Ur, POC negative negative   Urobilinogen, UA 0.2    Nitrite, UA Negative Negative   Leukocytes, UA Negative Negative  POCT glycosylated hemoglobin (Hb A1C)     Status: None   Collection Time: 08/23/15  3:24 PM  Result Value Ref Range   Hemoglobin A1C 5.5     ASSESSMENT & PLAN  Dustin Mckee was seen today for annual exam.  Diagnoses and all orders for this visit:  Physical exam  Screening -     POCT glycosylated hemoglobin (Hb A1C) -     POCT urinalysis dipstick -     COMPLETE METABOLIC PANEL WITH GFR -     TSH -     Lipid panel -     RPR -     HIV antibody -     Hepatitis C antibody -     Hepatitis B surface antibody -     Hepatitis B surface antigen -     CBC -     PSA  Need for prophylactic vaccination and inoculation against influenza -     Flu Vaccine QUAD 36+ mos IM  History of sleep apnea -     Ambulatory referral to Neurology  Elevated BP: Advised patient to monitor at home.  If BP is consistently over 140/90 he is to contact the office.  Will consider low dose medication at that time.     The patient was advised to call or come back to clinic if he does not see an improvement in symptoms, or worsens with the above plan.   Deliah BostonMichael Clark, MHS, PA-C [facility] 08/23/2015 4:12 PM

## 2015-08-24 LAB — CBC
HCT: 46.8 % (ref 39.0–52.0)
Hemoglobin: 16.1 g/dL (ref 13.0–17.0)
MCH: 29.6 pg (ref 26.0–34.0)
MCHC: 34.4 g/dL (ref 30.0–36.0)
MCV: 86 fL (ref 78.0–100.0)
MPV: 9.2 fL (ref 8.6–12.4)
PLATELETS: 218 10*3/uL (ref 150–400)
RBC: 5.44 MIL/uL (ref 4.22–5.81)
RDW: 14.8 % (ref 11.5–15.5)
WBC: 6.3 10*3/uL (ref 4.0–10.5)

## 2015-08-24 LAB — HIV ANTIBODY (ROUTINE TESTING W REFLEX): HIV 1&2 Ab, 4th Generation: NONREACTIVE

## 2015-08-24 LAB — TSH: TSH: 2.359 u[IU]/mL (ref 0.350–4.500)

## 2015-08-24 LAB — PSA: PSA: 2.77 ng/mL (ref <=4.00)

## 2015-08-24 LAB — RPR

## 2015-09-19 ENCOUNTER — Ambulatory Visit (INDEPENDENT_AMBULATORY_CARE_PROVIDER_SITE_OTHER): Payer: 59 | Admitting: Neurology

## 2015-09-19 ENCOUNTER — Encounter: Payer: Self-pay | Admitting: Neurology

## 2015-09-19 VITALS — BP 160/90 | HR 72 | Resp 20 | Ht 75.0 in | Wt 271.0 lb

## 2015-09-19 DIAGNOSIS — R0683 Snoring: Secondary | ICD-10-CM | POA: Diagnosis not present

## 2015-09-19 DIAGNOSIS — G4730 Sleep apnea, unspecified: Secondary | ICD-10-CM | POA: Diagnosis not present

## 2015-09-19 DIAGNOSIS — J302 Other seasonal allergic rhinitis: Secondary | ICD-10-CM

## 2015-09-19 DIAGNOSIS — G4710 Hypersomnia, unspecified: Secondary | ICD-10-CM | POA: Diagnosis not present

## 2015-09-19 MED ORDER — FEXOFENADINE HCL 60 MG PO TABS: 60.0000 mg | ORAL_TABLET | Freq: Two times a day (BID) | ORAL | Status: DC

## 2015-09-19 NOTE — Progress Notes (Signed)
SLEEP MEDICINE CLINIC   Provider:  Melvyn Novas, M D  Referring Provider: Tally Joe, MD Primary Care Physician:  Tonye Pearson, MD  Chief Complaint  Patient presents with  . New Patient (Initial Visit)    had sleep study in 2004 at [facility], no data available from the sleep studies, has cpap, wants smaller machine, rm 10    HPI:  Dustin Mckee is a 59 y.o. male , seen here as a referral from Dr. Nichola Sizer, MD  / Deliah Boston, PA at [facility] for a sleep consultation.  Chief complaint according to patient :" I don't have the same benefit by using CPAP".  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  Mr. Hunt reports that in the year 2004 he was diagnosed with sleep apnea at [facility] and a CPAP machine was prescribed. About 6 years later around 2010 there was an adjustment made to the pressures. Since then and in between he did not require any changes and he has used his CPAP faithfully ever since. Since 2010 he has gained weight and that may be a factor in his resurgence of sleepiness. He feels more fatigued unable to multitask or focus as well. A download was obtained today in office showing that his CPAP has been used on average 6 hours and 47 minutes per day and that he has is to 30 out of 30 days. His compliance is 96% which is excellent. Unfortunately he was prescribed an escape model which does not allow access to therapeutic data such as residual AHI. He is using and nasal pillow model as interface.  Sleep habits are as follows: Patient works daytime job, his usual bedtime is between 10 and 11:00 PM, he falls asleep promptly with his CPAP. He used to have nocturia up to 3 or 4 times at night and to before he started on Flomax now he will go to the bathroom perhaps once. He has noted that he doesn't seem to  dream as much anymore. He sleeps otherwise well through the night , on his left side, until he rises in the morning at 6 AM. He wakes up spontaneously without alarm. The bedroom is cool, quiet and dark. He shares the bedroom with his spouse. He will take a couple of coffee in the morning and commute to work. His workplace does not have access to natural daylight. He has found himself during the day sometimes for a couple of seconds to microsleep. He does not schedule any naps. After work around 6 PM he often feels that he is tired enough to take a nap and sometimes he will allow himself to nap for 30 minutes, but this is rare.  How likely are you to doze in the following situations: 0 = not likely, 1 = slight chance, 2 = moderate chance, 3 = high chance  Sitting and Reading?2 Watching Television?1 Sitting inactive in a public place (theater or meeting)?0 Lying down in the afternoon when circumstances permit?1 Sitting and talking to someone?0 Sitting quietly after lunch without alcohol?1 In a car, while stopped for a few minutes in traffic?0 As a passenger in a car for an hour without a break? 1  Total =5   Sleep medical history and family sleep history:  The patient has 3 brothers and his youngest brother was diagnosed with sleep apnea.  Both parents were snorers.  The patient slept walked in childhood after elementary school-age this ceased. Oldest son is a sleep walker.   Social history:  Married, 3 children , no ETOH, non smoker. coffee 1-2 cups per day.   Review of Systems: Out of a complete 14 system review, the patient complains of only the following symptoms, and all other reviewed systems are negative.   Epworth score 5, Fatigue severity score n/a  , depression score n/a   Social History   Social History  . Marital Status: Married    Spouse Name: Dustin Mckee  . Number of Children: N/A  . Years of Education: N/A   Occupational History  . Not on file.   Social History Main  Topics  . Smoking status: Never Smoker   . Smokeless tobacco: Never Used  . Alcohol Use: No  . Drug Use: No  . Sexual Activity: Yes    Birth Control/ Protection: None   Other Topics Concern  . Not on file   Social History Narrative   Drinks 1-2 cups caffeine daily.    Family History  Problem Relation Age of Onset  . Heart disease Mother   . Kidney disease Mother   . Heart disease Father     defibrillator/CHF.  . Diabetes Father   . Heart disease Sister     CHF    Past Medical History  Diagnosis Date  . Hyperlipidemia   . Allergy     Past Surgical History  Procedure Laterality Date  . Appendectomy    . Tooth extraction      Current Outpatient Prescriptions  Medication Sig Dispense Refill  . tamsulosin (FLOMAX) 0.4 MG CAPS capsule Take 1 capsule (0.4 mg total) by mouth daily. 30 capsule 11  . Vitamin D, Ergocalciferol, (DRISDOL) 50000 UNITS CAPS capsule Take 1 capsule (50,000 Units total) by mouth once a week. 4 capsule 5   No current facility-administered medications for this visit.    Allergies as of 09/19/2015 - Review Complete 09/19/2015  Allergen Reaction Noted  . Iodine Hives     Vitals: BP 160/90 mmHg  Pulse 72  Resp 20  Ht  (1.905 m)  Wt 271 lb (122.925 kg)  BMI 33.87 kg/m2 Last Weight:  Wt Readings from Last 1 Encounters:  09/19/15 271 lb (122.925 kg)   WUJ:WJXB mass index is 33.87 kg/(m^2).     Last Height:   Ht Readings from Last 1 Encounters:  09/19/15  (1.905 m)    Physical exam:  General: The patient is awake, alert and appears not in acute distress. The patient is well groomed. Head: Normocephalic, atraumatic. Neck is supple. Mallampati 3  , tonsillary atrophy  neck circumference:18.5. Nasal airflow restricited , TMJ not evident . Retrognathia not seen.  Cardiovascular:  Regular rate and rhythm, without  murmurs or carotid bruit, and without distended neck veins. Respiratory: Lungs are clear to auscultation. Skin:   Without evidence of edema, or rash Trunk: BMI is elevated. The patient's posture is erect   Neurologic exam : The patient is awake and alert, oriented to place and time.   Memory subjective described as intact.  Attention span & concentration ability appears normal.  Speech is fluent,  without  dysarthria, dysphonia or aphasia.  Mood and affect are appropriate.  Cranial nerves: Pupils are equal and briskly reactive to light. Funduscopic exam without evidence of pallor or edema.  Extraocular movements  in vertical and horizontal planes intact and without nystagmus. Visual fields  by finger perimetry are intact. Hearing to finger rub intact.   Facial sensation intact to fine touch.  Facial motor strength is symmetric and tongue and uvula move midline. Shoulder shrug was symmetrical.   Motor exam:  Normal tone, muscle bulk and symmetric strength in all extremities.  Sensory:  Fine touch, pinprick and vibration were tested in all extremities.  Proprioception tested in the upper extremities was normal.  Coordination: Rapid alternating movements in the fingers/hands was normal.  Finger-to-nose maneuver  normal without evidence of ataxia, dysmetria or tremor.  Gait and station: Patient walks without assistive device and is able unassisted to climb up to the exam table. Strength within normal limits.  Stance is stable and normal.  Toe and heel stand were tested . Turns with 3  Steps. Romberg testing is negative.  Deep tendon reflexes: in the  upper and lower extremities are symmetric and intact. Babinski maneuver response is downgoing.  The patient was advised of the nature of the diagnosed sleep disorder , the treatment options and risks for general a health and wellness arising from not treating the condition.  I spent more than 40 minutes of time with the patient. Greater than 50% of time was spent in  Face to face counseling and coordination of care. We have discussed the diagnosis and  differential and I answered the patient's questions.     Assessment:  After physical and neurologic examination, review of laboratory studies,  Personal review of imaging studies, reports of other /same  Imaging studies ,  Results of  neurophysiology testing and pre-existing records as far as provided in visit., my assessment is   1) [name]'s previous sleep studies are unfortunately not available for comparison, but his download describes a  sleep apnea patient who is highly compliant. He has not found the same restorative and refreshing sleep quality that he initially felt been starting on CPAP for a while.  2)He has gained weight since his last adjustment to CPAP pressure and he is willing to continue using CPAP. My goal is to reestablish a baseline and see what kind of degree of apnea is present and then re-titrate him to the best treatment pressure. I will order a home sleep test for him, and based on the results initiate either a in lab or out of lab titration or is very mild apnea is present without oxygen desaturations I would recommend a dental device.  3) the patient describes perennial allergic rhinitis and sinusitis. I would like for him to try just saline nasal spray to rinse the nostrils before using CPAP. He may find relief with Afrin but can only use this nasal spray for 5 days and then has to discontinue its use for another 5 days. Afrin also has a side effect of elevating blood pressures. He may prevent allergies by taking perennially Allegra under the generic name fexofenadine.      Plan:  Treatment plan and additional workup :  Resume allegra , HST , retitration to follow or dental referral .   RV after HST. 2-3 week.   Dustin Mylararmen Siria Calandro MD  09/19/2015   CC: Dr. Ellamae Siaobert Doolittle , [facility]

## 2015-09-19 NOTE — Patient Instructions (Addendum)

## 2015-10-09 ENCOUNTER — Other Ambulatory Visit: Payer: Self-pay | Admitting: Physician Assistant

## 2015-10-09 ENCOUNTER — Encounter: Payer: Self-pay | Admitting: Internal Medicine

## 2015-10-10 NOTE — Telephone Encounter (Signed)
Patient was seen for a physical in October he is looking for Vitamin D refill but i didn't see any lab work.  Can we refill

## 2015-10-10 NOTE — Telephone Encounter (Signed)
I advise that we recheck this.  Vitamin D is fat soluble and the body's stores can be replenished with supplementation.  In other words his vitamin D may be normal now.  Advise we recheck at his next physical.  Deliah BostonMichael Clark, MS, PA-C 8:37 PM, 10/10/2015

## 2015-10-18 ENCOUNTER — Telehealth: Payer: Self-pay | Admitting: Neurology

## 2015-10-18 NOTE — Telephone Encounter (Signed)
Patient called to advise that he hasn't heard from anyone regarding home sleep study that Dr. Vickey Hugerohmeier wanted him to have done.

## 2015-10-26 ENCOUNTER — Encounter (INDEPENDENT_AMBULATORY_CARE_PROVIDER_SITE_OTHER): Payer: 59 | Admitting: Neurology

## 2015-10-26 DIAGNOSIS — G4710 Hypersomnia, unspecified: Secondary | ICD-10-CM | POA: Diagnosis not present

## 2015-10-26 DIAGNOSIS — R0683 Snoring: Secondary | ICD-10-CM

## 2015-10-26 DIAGNOSIS — G4730 Sleep apnea, unspecified: Principal | ICD-10-CM

## 2015-10-26 DIAGNOSIS — J302 Other seasonal allergic rhinitis: Secondary | ICD-10-CM

## 2015-10-31 ENCOUNTER — Telehealth: Payer: Self-pay

## 2015-10-31 DIAGNOSIS — G4733 Obstructive sleep apnea (adult) (pediatric): Secondary | ICD-10-CM

## 2015-10-31 NOTE — Telephone Encounter (Signed)
Spoke with pt regarding his sleep study results. I advised him that his study revealed moderate severe osa and treatment is advised. CPAP therapy is recommended. Dr. Vickey Hugerohmeier recommends to proceed with a cpap retitration study. There are alternative therapies such as a dental device and/or ENT evaluation/procedure. I advised him that weight loss and positional therapy may be entertained. I advised him to avoid sedative hypnotics, alcohol, and tobacco. I also advised him to avoid caffeine containing beverages and chocolate. Pt verbalized understanding. Pt is willing to proceed with a cpap titration study. He wants it done ASAP. I will alert the sleep lab. Pt verbalized understanding that the sleep lab will call him to schedule.

## 2015-12-05 ENCOUNTER — Telehealth: Payer: Self-pay | Admitting: Neurology

## 2015-12-05 NOTE — Telephone Encounter (Signed)
Called pt to discuss cpap titration. I think he may need a revisit to discuss other options. Left a message asking him to call me back.

## 2015-12-05 NOTE — Telephone Encounter (Signed)
Call patient to schedule CPAP and the patient hasn't met his deductible for the year and wants to see if there is some way to get the information needed at a cheaper rate. °

## 2015-12-07 MED FILL — TAMSULOSIN HCL 0.4 MG CAP: 0.4 | 90 days supply | Qty: 90 | Fill #9

## 2015-12-11 NOTE — Telephone Encounter (Signed)
Called pt again. No answer, left a message asking him to call me back.

## 2015-12-12 NOTE — Telephone Encounter (Signed)
Called pt again.  No answer at the mobile number and VM is not set up to leave a message. If pt wants to discuss alternatives to cpap titration, he will need an appt. Will send a letter asking pt to call us back to discuss.

## 2016-04-09 ENCOUNTER — Other Ambulatory Visit: Payer: Self-pay | Admitting: Family Medicine

## 2016-04-10 MED FILL — TAMSULOSIN HCL 0.4 MG CAP: 0.4 | 30 days supply | Qty: 30 | Fill #0

## 2016-04-17 ENCOUNTER — Ambulatory Visit (INDEPENDENT_AMBULATORY_CARE_PROVIDER_SITE_OTHER): Payer: 59

## 2016-04-17 ENCOUNTER — Ambulatory Visit (INDEPENDENT_AMBULATORY_CARE_PROVIDER_SITE_OTHER): Payer: 59 | Admitting: Family Medicine

## 2016-04-17 VITALS — BP 176/88 | HR 78 | Temp 97.9°F | Resp 16 | Ht 74.0 in | Wt 284.0 lb

## 2016-04-17 DIAGNOSIS — R5383 Other fatigue: Secondary | ICD-10-CM

## 2016-04-17 DIAGNOSIS — M50322 Other cervical disc degeneration at C5-C6 level: Secondary | ICD-10-CM | POA: Diagnosis not present

## 2016-04-17 DIAGNOSIS — R0789 Other chest pain: Secondary | ICD-10-CM | POA: Diagnosis not present

## 2016-04-17 DIAGNOSIS — I10 Essential (primary) hypertension: Secondary | ICD-10-CM | POA: Diagnosis not present

## 2016-04-17 DIAGNOSIS — M79602 Pain in left arm: Secondary | ICD-10-CM

## 2016-04-17 DIAGNOSIS — M542 Cervicalgia: Secondary | ICD-10-CM

## 2016-04-17 DIAGNOSIS — Z8249 Family history of ischemic heart disease and other diseases of the circulatory system: Secondary | ICD-10-CM

## 2016-04-17 DIAGNOSIS — M50323 Other cervical disc degeneration at C6-C7 level: Secondary | ICD-10-CM | POA: Diagnosis not present

## 2016-04-17 LAB — BASIC METABOLIC PANEL
BUN: 14 mg/dL (ref 7–25)
CALCIUM: 9.2 mg/dL (ref 8.6–10.3)
CHLORIDE: 104 mmol/L (ref 98–110)
CO2: 24 mmol/L (ref 20–31)
Creat: 1.14 mg/dL (ref 0.70–1.33)
Glucose, Bld: 100 mg/dL — ABNORMAL HIGH (ref 65–99)
POTASSIUM: 4.3 mmol/L (ref 3.5–5.3)
SODIUM: 139 mmol/L (ref 135–146)

## 2016-04-17 LAB — POCT CBC
Granulocyte percent: 53.6 %G (ref 37–80)
HCT, POC: 45.7 % (ref 43.5–53.7)
Hemoglobin: 16.2 g/dL (ref 14.1–18.1)
LYMPH, POC: 2.1 (ref 0.6–3.4)
MCH, POC: 29.4 pg (ref 27–31.2)
MCHC: 35.5 g/dL — AB (ref 31.8–35.4)
MCV: 83 fL (ref 80–97)
MID (cbc): 0.3 (ref 0–0.9)
MPV: 6.7 fL (ref 0–99.8)
PLATELET COUNT, POC: 217 10*3/uL (ref 142–424)
POC Granulocyte: 2.8 (ref 2–6.9)
POC LYMPH %: 39.8 % (ref 10–50)
POC MID %: 6.6 %M (ref 0–12)
RBC: 5.5 M/uL (ref 4.69–6.13)
RDW, POC: 14.5 %
WBC: 5.2 10*3/uL (ref 4.6–10.2)

## 2016-04-17 LAB — GLUCOSE, POCT (MANUAL RESULT ENTRY): POC GLUCOSE: 83 mg/dL (ref 70–99)

## 2016-04-17 MED ORDER — HYDROCHLOROTHIAZIDE 12.5 MG PO CAPS: 12.5000 mg | ORAL_CAPSULE | Freq: Every day | ORAL | Status: DC

## 2016-04-17 MED ORDER — CYCLOBENZAPRINE HCL 5 MG PO TABS: ORAL_TABLET | ORAL | Status: DC

## 2016-04-17 MED FILL — CYCLOBENZAPRINE 5 MG TABLET: 5 | 5 days supply | Qty: 15 | Fill #0

## 2016-04-17 MED FILL — HYDROCHLOROTHIAZIDE 12.5 MG: 12.5 | 30 days supply | Qty: 30 | Fill #0

## 2016-04-17 NOTE — Progress Notes (Addendum)
By signing my name below I, Shelah Lewandowsky, attest that this documentation has been prepared under the direction and in the presence of Shade Flood, MD. Electonically Signed. Shelah Lewandowsky, Scribe 04/17/2016 at 10:06 AM   Subjective:    Patient ID: Dustin Mckee, male    DOB: 28-Mar-1956, 60 y.o.   MRN: [id number]  Chief Complaint  Patient presents with  . Shoulder Pain    x 4 days, radiates down arms, soreness    HPI Dustin Mckee is a 60 y.o. male who presents to the [facility] complaining of left shoulder pain radiating down left arm for the past 4 days. Pt was carrying a back pack while on a trip out of town a week ago. Pt also c/o left hand weakness and tingling sensation. Pain started as a crick in his neck when he woke up 4 days ago. Pain started radiating into his back and chest 2 days ago. Pain resolved with 600mg  ibuprofen this morning 3hrs ago. Tingling sensation has persisted. Pt is rt handed.  Pt had chest soreness radiating into his back 2 days ago and has been fatigued since. Pt denies SOB, nauseous, abd pain, or diaphoretic. Chest soreness remains unchanged with exertion. Pt denies SOB with exertion.   Pt's mother had CABG at 12. Pt's father needed to have a pacemaker placed at age 34 and also had history of DM.   Pt's last stress test was in 2009 and it was normal.   BPH  HLD  Obstructive sleep apnea moderate to severe OSA, however unable to do cpap titration due to cost. See telephone messages from sleep study.  Pt reports that he has been using a CPAP machine every night.   Elevated BP October 2016 Was recommended monitoring at home and recommended to follow up if BP over 140/90. Pt reports that he has been monitoring his BP and it has been in the 140/90 range.   Pt denies history of smoking.    Patient Active Problem List   Diagnosis Date Noted  . Hyperlipidemia 09/05/2014  . Unspecified vitamin D deficiency 06/14/2014  . BPH  (benign prostatic hypertrophy) with urinary obstruction 06/14/2014  . GERD 01/23/2009  . HYPERLIPIDEMIA 12/14/2008  . ALLERGIC RHINITIS 12/14/2008  . SLEEP APNEA 11/29/2008  . DYSPNEA 11/29/2008   Past Medical History  Diagnosis Date  . Hyperlipidemia   . Allergy    Past Surgical History  Procedure Laterality Date  . Appendectomy    . Tooth extraction     Allergies  Allergen Reactions  . Iodine Hives   Prior to Admission medications   Medication Sig Start Date End Date Taking? Authorizing Provider  tamsulosin (FLOMAX) 0.4 MG CAPS capsule TAKE ONE CAPSULE BY MOUTH DAILY 04/10/16  Yes Tonye Pearson, MD  fexofenadine (ALLEGRA) 60 MG tablet Take 1 tablet (60 mg total) by mouth 2 (two) times daily. Patient not taking: Reported on 04/17/2016 09/19/15   Melvyn Novas, MD  Vitamin D, Ergocalciferol, (DRISDOL) 50000 UNITS CAPS capsule Take 1 capsule (50,000 Units total) by mouth once a week. Patient not taking: Reported on 04/17/2016 03/08/15   Lenell Antu, DO   Social History   Social History  . Marital Status: Married    Spouse Name: [name]  . Number of Children: N/A  . Years of Education: N/A   Occupational History  . Not on file.   Social History Main Topics  . Smoking status: Never Smoker   .  Smokeless tobacco: Never Used  . Alcohol Use: No  . Drug Use: No  . Sexual Activity: Yes    Birth Control/ Protection: None   Other Topics Concern  . Not on file   Social History Narrative   Drinks 1-2 cups caffeine daily.      Review of Systems  Constitutional: Positive for fatigue. Negative for diaphoresis and unexpected weight change.  Eyes: Negative for visual disturbance.  Respiratory: Negative for cough, chest tightness and shortness of breath.   Cardiovascular: Positive for chest pain. Negative for palpitations and leg swelling.  Gastrointestinal: Negative for nausea, abdominal pain and blood in stool.  Musculoskeletal: Positive for myalgias (left shoulder) and  neck pain.  Neurological: Negative for dizziness, light-headedness and headaches.       Objective:   Physical Exam  Constitutional: He is oriented to person, place, and time. He appears well-developed and well-nourished.  HENT:  Head: Normocephalic and atraumatic.  Eyes: EOM are normal. Pupils are equal, round, and reactive to light.  Neck: No JVD present. Muscular tenderness (left paracervical into left trapezius) present. Carotid bruit is not present.  Pt's left sided neck pain is reproduced with rotation of the neck to the rt.  Cardiovascular: Normal rate, regular rhythm and normal heart sounds.   No murmur heard. Pulmonary/Chest: Effort normal and breath sounds normal. He has no rales.  Musculoskeletal: He exhibits no edema.  Neurological: He is alert and oriented to person, place, and time. He has normal strength. No cranial nerve deficit or sensory deficit.  Reflex Scores:      Tricep reflexes are 2+ on the right side and 2+ on the left side.      Bicep reflexes are 2+ on the right side and 2+ on the left side.      Brachioradialis reflexes are 2+ on the right side and 2+ on the left side. Pt has equal grip strength bilat.   Skin: Skin is warm and dry.  Psychiatric: He has a normal mood and affect.  Vitals reviewed.   Filed Vitals:   04/17/16 0854  BP: 176/88  Pulse: 78  Temp: 97.9 F (36.6 C)  Resp: 16  Height: 6\' 2"  (1.88 m)  Weight: 284 lb (128.822 kg)  SpO2: 97%   Results for orders placed or performed in visit on 04/17/16  POCT CBC  Result Value Ref Range   WBC 5.2 4.6 - 10.2 K/uL   Lymph, poc 2.1 0.6 - 3.4   POC LYMPH PERCENT 39.8 10 - 50 %L   MID (cbc) 0.3 0 - 0.9   POC MID % 6.6 0 - 12 %M   POC Granulocyte 2.8 2 - 6.9   Granulocyte percent 53.6 37 - 80 %G   RBC 5.50 4.69 - 6.13 M/uL   Hemoglobin 16.2 14.1 - 18.1 g/dL   HCT, POC 16.1 09.6 - 53.7 %   MCV 83.0 80 - 97 fL   MCH, POC 29.4 27 - 31.2 pg   MCHC 35.5 (A) 31.8 - 35.4 g/dL   RDW, POC 04.5 %     Platelet Count, POC 217 142 - 424 K/uL   MPV 6.7 0 - 99.8 fL  POCT glucose (manual entry)  Result Value Ref Range   POC Glucose 83 70 - 99 mg/dl   Dg Cervical Spine Complete  04/17/2016  CLINICAL DATA:  Onset of a [place] in the neck 5 days ago with subsequent development of left shoulder and arm pain and numbness EXAM: CERVICAL SPINE -  COMPLETE 4+ VIEW COMPARISON:  None in PACs FINDINGS: There is loss of the normal cervical lordosis. The vertebral bodies are preserved in height. There is disc space narrowing at C5-6 and C6-7 with anterior near bridging osteophytes. There is no perched facet. There is mild bony encroachment upon the left C5 and C6 and C7 neural foramina due to facet joint and uncovertebral joint osteophyte. The odontoid is intact. The prevertebral soft tissue spaces appear normal. IMPRESSION: There is degenerative disc, facet joint, and uncovertebral joint change centered at C5-6 and C6-7 with impact upon the left neural foramina at C5, C6, and C7. There is no compression fracture. Given the patient's radicular symptoms, cervical spine MRI may be useful. Electronically Signed   By: David  SwazilandJordan M.D.   On: 04/17/2016 09:44     EKG interpreted by Dr Neva SeatGreene. EKG shows  Sinus rhythm, T wave inversion in lead III, no apparent change from September 2014. Non specific ST changes in lead V2, no change from prior. No acute changes.     Assessment & Plan:   Dustin BasquesGregory A Nicoletti is a 60 y.o. male Other fatigue - Plan: EKG 12-Lead, POCT CBC, POCT glucose (manual entry) Chest discomfort - Plan: EKG 12-Lead, Ambulatory referral to Cardiology Family history of early CAD - Plan: EKG 12-Lead, Ambulatory referral to Cardiology  - Multiple risk factors for coronary disease, but no EKG changes in office, and suspect cervical radiculopathy may be causing some upper chest symptoms. We'll refer to cardiology, ER/911 chest pain precautions reviewed.  Avoid exertional activity for now until cleared by  cardiology.  Left arm pain - Plan: DG Cervical Spine Complete Neck pain on left side - Plan: DG Cervical Spine Complete  -Suspected cervical radiculopathy with degenerative changes seen on x-ray and foraminal stenosis. Trial of Flexeril initially, Tylenol as elevated BP.   - If not improving into next week, consider prednisone then MRI if still not improved. RTC precautions if worsening symptoms sooner.  Essential hypertension - Plan: Ambulatory referral to Cardiology, Basic metabolic panel, hydrochlorothiazide (MICROZIDE) 12.5 MG capsule  - start HCTZ, check BMP, recheck blood pressure in the next 4-6 weeks.  Meds ordered this encounter  Medications  . hydrochlorothiazide (MICROZIDE) 12.5 MG capsule    Sig: Take 1 capsule (12.5 mg total) by mouth daily.    Dispense:  30 capsule    Refill:  1   Patient Instructions       IF you received an x-ray today, you will receive an invoice from [facility]. Please contact [facility] at [phone] with questions or concerns regarding your invoice.   IF you received labwork today, you will receive an invoice from [facility]. Please contact [facility] at [phone] with questions or concerns regarding your invoice.   Our billing staff will not be able to assist you with questions regarding bills from these companies.  You will be contacted with the lab results as soon as they are available. The fastest way to get your results is to activate your My Chart account. Instructions are located on the last page of this paperwork. If you have not heard from us regarding the results in 2 weeks, please contact this office.    I suspect that your arm pain is due to a pinched nerve in your neck. This can sometimes radiate into the upper chest wall. However with your family history of heart disease, and episodic chest soreness, I would like you to see a cardiologist as soon as possible  for further stress testing  or evaluation. If you have any increased chest pain, heaviness, shortness of breath, nausea, sweating, be seen immediately in the emergency room or call 911.   For arm symptoms and neck symptoms, see information below. For now it may be safest to try Tylenol for pain.  Flexeril as needed for muscle relaxant, up to every 8 hours, but it does cause sedation so do not drive or operate machinery taking this medication. If your symptoms are not improving in the next 1 week, or worsening sooner, return for recheck As you may be able to use an anti-inflammatory, or prednisone if needed at that time. Return sooner if worse.  Blood pressure is also elevated again today, and suspect you have some component of hypertension. See information this below, but I would recommend we start hydrochlorothiazide, 1 pill once per day. Follow-up with your primary care provider, or here in the next 6 weeks to recheck blood pressure on this medication.  Cervical Radiculopathy Cervical radiculopathy happens when a nerve in the neck (cervical nerve) is pinched or bruised. This condition can develop because of an injury or as part of the normal aging process. Pressure on the cervical nerves can cause pain or numbness that runs from the neck all the way down into the arm and fingers. Usually, this condition gets better with rest. Treatment may be needed if the condition does not improve.  CAUSES This condition may be caused by:  Injury.  Slipped (herniated) disk.  Muscle tightness in the neck because of overuse.  Arthritis.  Breakdown or degeneration in the bones and joints of the spine (spondylosis) due to aging.  Bone spurs that may develop near the cervical nerves. SYMPTOMS Symptoms of this condition include:  Pain that runs from the neck to the arm and hand. The pain can be severe or irritating. It may be worse when the neck is moved.  Numbness or weakness in the affected arm and hand. DIAGNOSIS This condition  may be diagnosed based on symptoms, medical history, and a physical exam. You may also have tests, including:  X-rays.  CT scan.  MRI.  Electromyogram (EMG).  Nerve conduction tests. TREATMENT In many cases, treatment is not needed for this condition. With rest, the condition usually gets better over time. If treatment is needed, options may include:  Wearing a soft neck collar for short periods of time.  Physical therapy to strengthen your neck muscles.  Medicines, such as NSAIDs, oral corticosteroids, or spinal injections.  Surgery. This may be needed if other treatments do not help. Various types of surgery may be done depending on the cause of your problems. HOME CARE INSTRUCTIONS Managing Pain  Take over-the-counter and prescription medicines only as told by your health care provider.  If directed, apply ice to the affected area.  Put ice in a plastic bag.  Place a towel between your skin and the bag.  Leave the ice on for 20 minutes, 2-3 times per day.  If ice does not help, you can try using heat. Take a warm shower or warm bath, or use a heat pack as told by your health care provider.  Try a gentle neck and shoulder massage to help relieve symptoms. Activity  Rest as needed. Follow instructions from your health care provider about any restrictions on activities.  Do stretching and strengthening exercises as told by your health care provider or physical therapist. General Instructions  If you were given a soft collar, wear it  as told by your health care provider.  Use a flat pillow when you sleep.  Keep all follow-up visits as told by your health care provider. This is important. SEEK MEDICAL CARE IF:  Your condition does not improve with treatment. SEEK IMMEDIATE MEDICAL CARE IF:  Your pain gets much worse and cannot be controlled with medicines.  You have weakness or numbness in your hand, arm, face, or leg.  You have a high fever.  You have a  stiff, rigid neck.  You lose control of your bowels or your bladder (have incontinence).  You have trouble with walking, balance, or speaking.   This information is not intended to replace advice given to you by your health care provider. Make sure you discuss any questions you have with your health care provider.   Document Released: 07/23/2001 Document Revised: 07/19/2015 Document Reviewed: 12/22/2014 Elsevier Interactive Patient Education 2016 [employer].  Hypertension Hypertension, commonly called high blood pressure, is when the force of blood pumping through your arteries is too strong. Your arteries are the blood vessels that carry blood from your heart throughout your body. A blood pressure reading consists of a higher number over a lower number, such as 110/72. The higher number (systolic) is the pressure inside your arteries when your heart pumps. The lower number (diastolic) is the pressure inside your arteries when your heart relaxes. Ideally you want your blood pressure below 120/80. Hypertension forces your heart to work harder to pump blood. Your arteries may become narrow or stiff. Having untreated or uncontrolled hypertension can cause heart attack, stroke, kidney disease, and other problems. RISK FACTORS Some risk factors for high blood pressure are controllable. Others are not.  Risk factors you cannot control include:   Race. You may be at higher risk if you are African American.  Age. Risk increases with age.  Gender. Men are at higher risk than women before age 48 years. After age 58, women are at higher risk than men. Risk factors you can control include:  Not getting enough exercise or physical activity.  Being overweight.  Getting too much fat, sugar, calories, or salt in your diet.  Drinking too much alcohol. SIGNS AND SYMPTOMS Hypertension does not usually cause signs or symptoms. Extremely high blood pressure (hypertensive crisis) may cause headache,  anxiety, shortness of breath, and nosebleed. DIAGNOSIS To check if you have hypertension, your health care provider will measure your blood pressure while you are seated, with your arm held at the level of your heart. It should be measured at least twice using the same arm. Certain conditions can cause a difference in blood pressure between your right and left arms. A blood pressure reading that is higher than normal on one occasion does not mean that you need treatment. If it is not clear whether you have high blood pressure, you may be asked to return on a different day to have your blood pressure checked again. Or, you may be asked to monitor your blood pressure at home for 1 or more weeks. TREATMENT Treating high blood pressure includes making lifestyle changes and possibly taking medicine. Living a healthy lifestyle can help lower high blood pressure. You may need to change some of your habits. Lifestyle changes may include:  Following the DASH diet. This diet is high in fruits, vegetables, and whole grains. It is low in salt, red meat, and added sugars.  Keep your sodium intake below 2,300 mg per day.  Getting at least 30-45 minutes of aerobic  exercise at least 4 times per week.  Losing weight if necessary.  Not smoking.  Limiting alcoholic beverages.  Learning ways to reduce stress. Your health care provider may prescribe medicine if lifestyle changes are not enough to get your blood pressure under control, and if one of the following is true:  You are 30-60 years of age and your systolic blood pressure is above 140.  You are 84 years of age or older, and your systolic blood pressure is above 150.  Your diastolic blood pressure is above 90.  You have diabetes, and your systolic blood pressure is over 140 or your diastolic blood pressure is over 90.  You have kidney disease and your blood pressure is above 140/90.  You have heart disease and your blood pressure is above  140/90. Your personal target blood pressure may vary depending on your medical conditions, your age, and other factors. HOME CARE INSTRUCTIONS  Have your blood pressure rechecked as directed by your health care provider.   Take medicines only as directed by your health care provider. Follow the directions carefully. Blood pressure medicines must be taken as prescribed. The medicine does not work as well when you skip doses. Skipping doses also puts you at risk for problems.  Do not smoke.   Monitor your blood pressure at home as directed by your health care provider. SEEK MEDICAL CARE IF:   You think you are having a reaction to medicines taken.  You have recurrent headaches or feel dizzy.  You have swelling in your ankles.  You have trouble with your vision. SEEK IMMEDIATE MEDICAL CARE IF:  You develop a severe headache or confusion.  You have unusual weakness, numbness, or feel faint.  You have severe chest or abdominal pain.  You vomit repeatedly.  You have trouble breathing. MAKE SURE YOU:   Understand these instructions.  Will watch your condition.  Will get help right away if you are not doing well or get worse.   This information is not intended to replace advice given to you by your health care provider. Make sure you discuss any questions you have with your health care provider.   Document Released: 10/28/2005 Document Revised: 03/14/2015 Document Reviewed: 08/20/2013 Elsevier Interactive Patient Education [employer]     I personally performed the services described in this documentation, which was scribed in my presence. The recorded information has been reviewed and considered, and addended by me as needed.   Signed,   [name], MD [facility].  04/17/2016 10:11 AM

## 2016-04-17 NOTE — Patient Instructions (Addendum)
IF you received an x-ray today, you will receive an invoice from [facility]. Please contact [facility] at [phone] with questions or concerns regarding your invoice.   IF you received labwork today, you will receive an invoice from [employer]. Please contact [facility] at [phone] with questions or concerns regarding your invoice.   Our billing staff will not be able to assist you with questions regarding bills from these companies.  You will be contacted with the lab results as soon as they are available. The fastest way to get your results is to activate your My Chart account. Instructions are located on the last page of this paperwork. If you have not heard from [place] regarding the results in 2 weeks, please contact this office.    I suspect that your arm pain is due to a pinched nerve in your neck. This can sometimes radiate into the upper chest wall. However with your family history of heart disease, and episodic chest soreness, I would like you to see a cardiologist as soon as possible for further stress testing or evaluation. If you have any increased chest pain, heaviness, shortness of breath, nausea, sweating, be seen immediately in the emergency room or call 911.   For arm symptoms and neck symptoms, see information below. For now it may be safest to try Tylenol for pain.  Flexeril as needed for muscle relaxant, up to every 8 hours, but it does cause sedation so do not drive or operate machinery taking this medication. If your symptoms are not improving in the next 1 week, or worsening sooner, return for recheck As you may be able to use an anti-inflammatory, or prednisone if needed at that time. Return sooner if worse.  Blood pressure is also elevated again today, and suspect you have some component of hypertension. See information this below, but I would recommend we start hydrochlorothiazide, 1 pill once per day. Follow-up with your  primary care provider, or here in the next 6 weeks to recheck blood pressure on this medication.  Cervical Radiculopathy Cervical radiculopathy happens when a nerve in the neck (cervical nerve) is pinched or bruised. This condition can develop because of an injury or as part of the normal aging process. Pressure on the cervical nerves can cause pain or numbness that runs from the neck all the way down into the arm and fingers. Usually, this condition gets better with rest. Treatment may be needed if the condition does not improve.  CAUSES This condition may be caused by:  Injury.  Slipped (herniated) disk.  Muscle tightness in the neck because of overuse.  Arthritis.  Breakdown or degeneration in the bones and joints of the spine (spondylosis) due to aging.  Bone spurs that may develop near the cervical nerves. SYMPTOMS Symptoms of this condition include:  Pain that runs from the neck to the arm and hand. The pain can be severe or irritating. It may be worse when the neck is moved.  Numbness or weakness in the affected arm and hand. DIAGNOSIS This condition may be diagnosed based on symptoms, medical history, and a physical exam. You may also have tests, including:  X-rays.  CT scan.  MRI.  Electromyogram (EMG).  Nerve conduction tests. TREATMENT In many cases, treatment is not needed for this condition. With rest, the condition usually gets better over time. If treatment is needed, options may include:  Wearing a soft neck collar for short periods of time.  Physical therapy to  strengthen your neck muscles.  Medicines, such as NSAIDs, oral corticosteroids, or spinal injections.  Surgery. This may be needed if other treatments do not help. Various types of surgery may be done depending on the cause of your problems. HOME CARE INSTRUCTIONS Managing Pain  Take over-the-counter and prescription medicines only as told by your health care provider.  If directed, apply ice  to the affected area.  Put ice in a plastic bag.  Place a towel between your skin and the bag.  Leave the ice on for 20 minutes, 2-3 times per day.  If ice does not help, you can try using heat. Take a warm shower or warm bath, or use a heat pack as told by your health care provider.  Try a gentle neck and shoulder massage to help relieve symptoms. Activity  Rest as needed. Follow instructions from your health care provider about any restrictions on activities.  Do stretching and strengthening exercises as told by your health care provider or physical therapist. General Instructions  If you were given a soft collar, wear it as told by your health care provider.  Use a flat pillow when you sleep.  Keep all follow-up visits as told by your health care provider. This is important. SEEK MEDICAL CARE IF:  Your condition does not improve with treatment. SEEK IMMEDIATE MEDICAL CARE IF:  Your pain gets much worse and cannot be controlled with medicines.  You have weakness or numbness in your hand, arm, face, or leg.  You have a high fever.  You have a stiff, rigid neck.  You lose control of your bowels or your bladder (have incontinence).  You have trouble with walking, balance, or speaking.   This information is not intended to replace advice given to you by your health care provider. Make sure you discuss any questions you have with your health care provider.   Document Released: 07/23/2001 Document Revised: 07/19/2015 Document Reviewed: 12/22/2014 Elsevier Interactive Patient Education 2016 [employer].  Hypertension Hypertension, commonly called high blood pressure, is when the force of blood pumping through your arteries is too strong. Your arteries are the blood vessels that carry blood from your heart throughout your body. A blood pressure reading consists of a higher number over a lower number, such as 110/72. The higher number (systolic) is the pressure inside your  arteries when your heart pumps. The lower number (diastolic) is the pressure inside your arteries when your heart relaxes. Ideally you want your blood pressure below 120/80. Hypertension forces your heart to work harder to pump blood. Your arteries may become narrow or stiff. Having untreated or uncontrolled hypertension can cause heart attack, stroke, kidney disease, and other problems. RISK FACTORS Some risk factors for high blood pressure are controllable. Others are not.  Risk factors you cannot control include:   Race. You may be at higher risk if you are African American.  Age. Risk increases with age.  Gender. Men are at higher risk than women before age 68 years. After age 3, women are at higher risk than men. Risk factors you can control include:  Not getting enough exercise or physical activity.  Being overweight.  Getting too much fat, sugar, calories, or salt in your diet.  Drinking too much alcohol. SIGNS AND SYMPTOMS Hypertension does not usually cause signs or symptoms. Extremely high blood pressure (hypertensive crisis) may cause headache, anxiety, shortness of breath, and nosebleed. DIAGNOSIS To check if you have hypertension, your health care provider will measure your blood  pressure while you are seated, with your arm held at the level of your heart. It should be measured at least twice using the same arm. Certain conditions can cause a difference in blood pressure between your right and left arms. A blood pressure reading that is higher than normal on one occasion does not mean that you need treatment. If it is not clear whether you have high blood pressure, you may be asked to return on a different day to have your blood pressure checked again. Or, you may be asked to monitor your blood pressure at home for 1 or more weeks. TREATMENT Treating high blood pressure includes making lifestyle changes and possibly taking medicine. Living a healthy lifestyle can help lower  high blood pressure. You may need to change some of your habits. Lifestyle changes may include:  Following the DASH diet. This diet is high in fruits, vegetables, and whole grains. It is low in salt, red meat, and added sugars.  Keep your sodium intake below 2,300 mg per day.  Getting at least 30-45 minutes of aerobic exercise at least 4 times per week.  Losing weight if necessary.  Not smoking.  Limiting alcoholic beverages.  Learning ways to reduce stress. Your health care provider may prescribe medicine if lifestyle changes are not enough to get your blood pressure under control, and if one of the following is true:  You are 5318-58 years of age and your systolic blood pressure is above 140.  You are 58 years of age or older, and your systolic blood pressure is above 150.  Your diastolic blood pressure is above 90.  You have diabetes, and your systolic blood pressure is over 140 or your diastolic blood pressure is over 90.  You have kidney disease and your blood pressure is above 140/90.  You have heart disease and your blood pressure is above 140/90. Your personal target blood pressure may vary depending on your medical conditions, your age, and other factors. HOME CARE INSTRUCTIONS  Have your blood pressure rechecked as directed by your health care provider.   Take medicines only as directed by your health care provider. Follow the directions carefully. Blood pressure medicines must be taken as prescribed. The medicine does not work as well when you skip doses. Skipping doses also puts you at risk for problems.  Do not smoke.   Monitor your blood pressure at home as directed by your health care provider. SEEK MEDICAL CARE IF:   You think you are having a reaction to medicines taken.  You have recurrent headaches or feel dizzy.  You have swelling in your ankles.  You have trouble with your vision. SEEK IMMEDIATE MEDICAL CARE IF:  You develop a severe headache or  confusion.  You have unusual weakness, numbness, or feel faint.  You have severe chest or abdominal pain.  You vomit repeatedly.  You have trouble breathing. MAKE SURE YOU:   Understand these instructions.  Will watch your condition.  Will get help right away if you are not doing well or get worse.   This information is not intended to replace advice given to you by your health care provider. Make sure you discuss any questions you have with your health care provider.   Document Released: 10/28/2005 Document Revised: 03/14/2015 Document Reviewed: 08/20/2013 Elsevier Interactive Patient Education [employer]2016 Elsevier Inc.

## 2016-04-22 DIAGNOSIS — G4733 Obstructive sleep apnea (adult) (pediatric): Secondary | ICD-10-CM | POA: Diagnosis not present

## 2016-04-29 DIAGNOSIS — E669 Obesity, unspecified: Secondary | ICD-10-CM | POA: Diagnosis not present

## 2016-04-29 DIAGNOSIS — I10 Essential (primary) hypertension: Secondary | ICD-10-CM | POA: Diagnosis not present

## 2016-04-29 DIAGNOSIS — R0789 Other chest pain: Secondary | ICD-10-CM | POA: Diagnosis not present

## 2016-04-29 DIAGNOSIS — E782 Mixed hyperlipidemia: Secondary | ICD-10-CM | POA: Diagnosis not present

## 2016-04-29 MED FILL — LISINOPRIL-HCTZ 20-12.5 MG: 20-12.5 | 90 days supply | Qty: 90 | Fill #0

## 2016-04-29 MED FILL — ROSUVASTATIN CALCIUM 10 MG: 10 | 30 days supply | Qty: 30 | Fill #0 | Status: TO

## 2016-05-03 ENCOUNTER — Ambulatory Visit (INDEPENDENT_AMBULATORY_CARE_PROVIDER_SITE_OTHER): Payer: 59 | Admitting: Family Medicine

## 2016-05-03 VITALS — BP 122/74 | HR 86 | Temp 98.7°F | Resp 18 | Ht 74.0 in | Wt 283.4 lb

## 2016-05-03 DIAGNOSIS — M5412 Radiculopathy, cervical region: Secondary | ICD-10-CM | POA: Diagnosis not present

## 2016-05-03 MED ORDER — PREDNISONE 20 MG PO TABS: ORAL_TABLET | ORAL | Status: DC

## 2016-05-03 NOTE — Patient Instructions (Addendum)
IF you received an x-ray today, you will receive an invoice from [facility]. Please contact [facility] at [phone] with questions or concerns regarding your invoice.   IF you received labwork today, you will receive an invoice from [facility]. Please contact [facility] at [phone] with questions or concerns regarding your invoice.   Our billing staff will not be able to assist you with questions regarding bills from these companies.  You will be contacted with the lab results as soon as they are available. The fastest way to get your results is to activate your My Chart account. Instructions are located on the last page of this paperwork. If you have not heard from us regarding the results in 2 weeks, please contact this office.     Try the prednisone to see if this will help with the neck pain. Okay to continue Flexeril at bedtime if needed. Tylenol over-the-counter as okay, but no other anti-inflammatories. If not improving in the next week to 2 weeks, would consider orthopedic evaluation or an MRI. Sooner if worse. Let me know if you have any questions.  Cervical Radiculopathy Cervical radiculopathy happens when a nerve in the neck (cervical nerve) is pinched or bruised. This condition can develop because of an injury or as part of the normal aging process. Pressure on the cervical nerves can cause pain or numbness that runs from the neck all the way down into the arm and fingers. Usually, this condition gets better with rest. Treatment may be needed if the condition does not improve.  CAUSES This condition may be caused by:  Injury.  Slipped (herniated) disk.  Muscle tightness in the neck because of overuse.  Arthritis.  Breakdown or degeneration in the bones and joints of the spine (spondylosis) due to aging.  Bone spurs that may develop near the cervical nerves. SYMPTOMS Symptoms of this condition include:  Pain that  runs from the neck to the arm and hand. The pain can be severe or irritating. It may be worse when the neck is moved.  Numbness or weakness in the affected arm and hand. DIAGNOSIS This condition may be diagnosed based on symptoms, medical history, and a physical exam. You may also have tests, including:  X-rays.  CT scan.  MRI.  Electromyogram (EMG).  Nerve conduction tests. TREATMENT In many cases, treatment is not needed for this condition. With rest, the condition usually gets better over time. If treatment is needed, options may include:  Wearing a soft neck collar for short periods of time.  Physical therapy to strengthen your neck muscles.  Medicines, such as NSAIDs, oral corticosteroids, or spinal injections.  Surgery. This may be needed if other treatments do not help. Various types of surgery may be done depending on the cause of your problems. HOME CARE INSTRUCTIONS Managing Pain  Take over-the-counter and prescription medicines only as told by your health care provider.  If directed, apply ice to the affected area.  Put ice in a plastic bag.  Place a towel between your skin and the bag.  Leave the ice on for 20 minutes, 2-3 times per day.  If ice does not help, you can try using heat. Take a warm shower or warm bath, or use a heat pack as told by your health care provider.  Try a gentle neck and shoulder massage to help relieve symptoms. Activity  Rest as needed. Follow instructions from your health care provider about any restrictions on activities.  Do stretching and strengthening exercises as told by your health care provider or physical therapist. General Instructions  If you were given a soft collar, wear it as told by your health care provider.  Use a flat pillow when you sleep.  Keep all follow-up visits as told by your health care provider. This is important. SEEK MEDICAL CARE IF:  Your condition does not improve with treatment. SEEK  IMMEDIATE MEDICAL CARE IF:  Your pain gets much worse and cannot be controlled with medicines.  You have weakness or numbness in your hand, arm, face, or leg.  You have a high fever.  You have a stiff, rigid neck.  You lose control of your bowels or your bladder (have incontinence).  You have trouble with walking, balance, or speaking.   This information is not intended to replace advice given to you by your health care provider. Make sure you discuss any questions you have with your health care provider.   Document Released: 07/23/2001 Document Revised: 07/19/2015 Document Reviewed: 12/22/2014 Elsevier Interactive Patient Education [employer]2016 Elsevier Inc.

## 2016-05-03 NOTE — Progress Notes (Signed)
By signing my name below, I, [name], attest that this documentation has been prepared under the direction and in the presence of Meredith StaggersJeffrey Pearce Littlefield, MD.  Electronically Signed: Arvilla MarketMesha Guinyard, Medical Scribe. 05/03/2016. 3:01 PM.  Subjective:    Patient ID: Dustin Mckee, male    DOB: 08/02/1956, 60 y.o.   MRN: [id number]  HPI Chief Complaint  Patient presents with  . Follow-up    neck pain, says it is not getting any better    HPI Comments: Dustin Mckee is a 60 y.o. male who presents to the [facility] for follow-up for neck pain. Initially seen approximately 2 weeks ago- suspected cervical radiculopathy. Initially treated with Flexeril. Here for follow-up. Pt states the pain is about the same. Pt states the pain gets worse in the evenings. Pt had 2 days where it didn't bother him as much and he didn't take anything for his symptoms. Pt has been taking ibuprofen for relief. Pt won't take Flexeril because it makes him sleepy in the day. Pt feels pain from his left neck and tingling radiates to his arm. Pt states the tingling in his arm is not as bad as last office visit. Pt doesn't currently feel tingling in his hand. Pt went to [place] last week but couldn't do Some activities due to the pain.  Patient Active Problem List   Diagnosis Date Noted  . Hyperlipidemia 09/05/2014  . Unspecified vitamin D deficiency 06/14/2014  . BPH (benign prostatic hypertrophy) with urinary obstruction 06/14/2014  . GERD 01/23/2009  . HYPERLIPIDEMIA 12/14/2008  . ALLERGIC RHINITIS 12/14/2008  . SLEEP APNEA 11/29/2008  . DYSPNEA 11/29/2008   Past Medical History  Diagnosis Date  . Hyperlipidemia   . Allergy    Past Surgical History  Procedure Laterality Date  . Appendectomy    . Tooth extraction     Allergies  Allergen Reactions  . Iodine Hives   Prior to Admission medications   Medication Sig Start Date End Date Taking? Authorizing Provider  cyclobenzaprine (FLEXERIL) 5  MG tablet 1 pill by mouth up to every 8 hours as needed. Start with one pill by mouth each bedtime as needed due to sedation 04/17/16  Yes Shade FloodJeffrey R Kendan Cornforth, MD  hydrochlorothiazide (MICROZIDE) 12.5 MG capsule Take 1 capsule (12.5 mg total) by mouth daily. 04/17/16  Yes Shade FloodJeffrey R Zavian Slowey, MD  tamsulosin (FLOMAX) 0.4 MG CAPS capsule TAKE ONE CAPSULE BY MOUTH DAILY 04/10/16  Yes Tonye Pearsonobert P Doolittle, MD   Social History   Social History  . Marital Status: Married    Spouse Name: Gavin PoundDeborah  . Number of Children: N/A  . Years of Education: N/A   Occupational History  . Not on file.   Social History Main Topics  . Smoking status: Never Smoker   . Smokeless tobacco: Never Used  . Alcohol Use: No  . Drug Use: No  . Sexual Activity: Yes    Birth Control/ Protection: None   Other Topics Concern  . Not on file   Social History Narrative   Drinks 1-2 cups caffeine daily.   Review of Systems  Musculoskeletal: Positive for neck pain.    Objective:  BP 122/74 mmHg  Pulse 86  Temp(Src) 98.7 F (37.1 C) (Oral)  Resp 18  Ht 6\' 2"  (1.88 m)  Wt 283 lb 6.4 oz (128.549 kg)  BMI 36.37 kg/m2  SpO2 95%  Physical Exam  Constitutional: He appears well-developed and well-nourished. No distress.  HENT:  Head: Normocephalic and  atraumatic.  Eyes: Conjunctivae are normal.  Neck: Neck supple.  Cardiovascular: Normal rate.   Pulmonary/Chest: Effort normal.  Musculoskeletal:  Decreased extension in his cervical spine Tach rotation Tight sensation with right lateral flexion, otherwise FROM Tenderness along left paraspinal muscles along trapezious  Neurological: He is alert.  Reflex Scores:      Tricep reflexes are 2+ on the right side and 2+ on the left side.      Bicep reflexes are 2+ on the right side and 2+ on the left side.      Brachioradialis reflexes are 2+ on the right side and 2+ on the left side. Equal strength in upper extremities bilaterally  Skin: Skin is warm and dry.  Psychiatric: He  has a normal mood and affect. His behavior is normal.  Nursing note and vitals reviewed.   Assessment & Plan:   Dustin Mckee is a 60 y.o. male Left cervical radiculopathy - Plan: predniSONE (DELTASONE) 20 MG tablet   -Persistent, with minimal improvement. Trial of prednisone taper. Side effects discussed, Flexeril at bedtime if needed, Tylenol otc,, RTC precautions. Consider MRI or orthopedic eval if not improving with prednisone taper.  Meds ordered this encounter  Medications  . predniSONE (DELTASONE) 20 MG tablet    Sig: 3 by mouth for 3 days, then 2 by mouth for 2 days, then 1 by mouth for 2 days, then 1/2 by mouth for 2 days.    Dispense:  16 tablet    Refill:  0   Patient Instructions       IF you received an x-ray today, you will receive an invoice from [facility]. Please contact [facility] at [phone] with questions or concerns regarding your invoice.   IF you received labwork today, you will receive an invoice from [facility]. Please contact [facility] at [phone] with questions or concerns regarding your invoice.   Our billing staff will not be able to assist you with questions regarding bills from these companies.  You will be contacted with the lab results as soon as they are available. The fastest way to get your results is to activate your My Chart account. Instructions are located on the last page of this paperwork. If you have not heard from us regarding the results in 2 weeks, please contact this office.     Try the prednisone to see if this will help with the neck pain. Okay to continue Flexeril at bedtime if needed. Tylenol over-the-counter as okay, but no other anti-inflammatories. If not improving in the next week to 2 weeks, would consider orthopedic evaluation or an MRI. Sooner if worse. Let me know if you have any questions.  Cervical Radiculopathy Cervical radiculopathy happens when a nerve in the  neck (cervical nerve) is pinched or bruised. This condition can develop because of an injury or as part of the normal aging process. Pressure on the cervical nerves can cause pain or numbness that runs from the neck all the way down into the arm and fingers. Usually, this condition gets better with rest. Treatment may be needed if the condition does not improve.  CAUSES This condition may be caused by:  Injury.  Slipped (herniated) disk.  Muscle tightness in the neck because of overuse.  Arthritis.  Breakdown or degeneration in the bones and joints of the spine (spondylosis) due to aging.  Bone spurs that may develop near the cervical nerves. SYMPTOMS Symptoms of this condition include:  Pain that runs from the  neck to the arm and hand. The pain can be severe or irritating. It may be worse when the neck is moved.  Numbness or weakness in the affected arm and hand. DIAGNOSIS This condition may be diagnosed based on symptoms, medical history, and a physical exam. You may also have tests, including:  X-rays.  CT scan.  MRI.  Electromyogram (EMG).  Nerve conduction tests. TREATMENT In many cases, treatment is not needed for this condition. With rest, the condition usually gets better over time. If treatment is needed, options may include:  Wearing a soft neck collar for short periods of time.  Physical therapy to strengthen your neck muscles.  Medicines, such as NSAIDs, oral corticosteroids, or spinal injections.  Surgery. This may be needed if other treatments do not help. Various types of surgery may be done depending on the cause of your problems. HOME CARE INSTRUCTIONS Managing Pain  Take over-the-counter and prescription medicines only as told by your health care provider.  If directed, apply ice to the affected area.  Put ice in a plastic bag.  Place a towel between your skin and the bag.  Leave the ice on for 20 minutes, 2-3 times per day.  If ice does not  help, you can try using heat. Take a warm shower or warm bath, or use a heat pack as told by your health care provider.  Try a gentle neck and shoulder massage to help relieve symptoms. Activity  Rest as needed. Follow instructions from your health care provider about any restrictions on activities.  Do stretching and strengthening exercises as told by your health care provider or physical therapist. General Instructions  If you were given a soft collar, wear it as told by your health care provider.  Use a flat pillow when you sleep.  Keep all follow-up visits as told by your health care provider. This is important. SEEK MEDICAL CARE IF:  Your condition does not improve with treatment. SEEK IMMEDIATE MEDICAL CARE IF:  Your pain gets much worse and cannot be controlled with medicines.  You have weakness or numbness in your hand, arm, face, or leg.  You have a high fever.  You have a stiff, rigid neck.  You lose control of your bowels or your bladder (have incontinence).  You have trouble with walking, balance, or speaking.   This information is not intended to replace advice given to you by your health care provider. Make sure you discuss any questions you have with your health care provider.   Document Released: 07/23/2001 Document Revised: 07/19/2015 Document Reviewed: 12/22/2014 Elsevier Interactive Patient Education [employer]     I personally performed the services described in this documentation, which was scribed in my presence. The recorded information has been reviewed and considered, and addended by me as needed.   Signed,   [name], MD [facility].  05/03/2016 3:13 PM

## 2016-05-10 ENCOUNTER — Other Ambulatory Visit: Payer: Self-pay | Admitting: Internal Medicine

## 2016-05-10 DIAGNOSIS — R0789 Other chest pain: Secondary | ICD-10-CM | POA: Diagnosis not present

## 2016-05-24 ENCOUNTER — Other Ambulatory Visit: Payer: Self-pay

## 2016-05-24 MED ORDER — TAMSULOSIN HCL 0.4 MG PO CAPS: 0.4000 mg | ORAL_CAPSULE | Freq: Every day | ORAL | Status: DC

## 2016-05-24 MED FILL — TAMSULOSIN HCL 0.4 MG CAP: 0.4 | 30 days supply | Qty: 30 | Fill #0

## 2016-05-25 ENCOUNTER — Ambulatory Visit (INDEPENDENT_AMBULATORY_CARE_PROVIDER_SITE_OTHER): Payer: 59 | Admitting: Urgent Care

## 2016-05-25 VITALS — BP 130/70 | HR 89 | Temp 98.2°F | Resp 17 | Ht 74.5 in | Wt 273.0 lb

## 2016-05-25 DIAGNOSIS — N400 Enlarged prostate without lower urinary tract symptoms: Secondary | ICD-10-CM | POA: Diagnosis not present

## 2016-05-25 MED ORDER — TAMSULOSIN HCL 0.4 MG PO CAPS: 0.4000 mg | ORAL_CAPSULE | Freq: Every day | ORAL | Status: DC

## 2016-05-25 NOTE — Patient Instructions (Addendum)
Benign Prostatic Hyperplasia An enlarged prostate (benign prostatic hyperplasia) is common in older men. You may experience the following:  Weak urine stream.  Dribbling.  Feeling like the bladder has not emptied completely.  Difficulty starting urination.  Getting up frequently at night to urinate.  Urinating more frequently during the day. HOME CARE INSTRUCTIONS  Monitor your prostatic hyperplasia for any changes. The following actions may help to alleviate any discomfort you are experiencing:  Give yourself time when you urinate.  Stay away from alcohol.  Avoid beverages containing caffeine, such as coffee, tea, and colas, because they can make the problem worse.  Avoid decongestants, antihistamines, and some prescription medicines that can make the problem worse.  Follow up with your health care provider for further treatment as recommended. SEEK MEDICAL CARE IF:  You are experiencing progressive difficulty voiding.  Your urine stream is progressively getting narrower.  You are awaking from sleep with the urge to void more frequently.  You are constantly feeling the need to void.  You experience loss of urine, especially in small amounts. SEEK IMMEDIATE MEDICAL CARE IF:   You develop increased pain with urination or are unable to urinate.  You develop severe abdominal pain, vomiting, a high fever, or fainting.  You develop back pain or blood in your urine. MAKE SURE YOU:   Understand these instructions.  Will watch your condition.  Will get help right away if you are not doing well or get worse.   This information is not intended to replace advice given to you by your health care provider. Make sure you discuss any questions you have with your health care provider.   Document Released: 10/28/2005 Document Revised: 11/18/2014 Document Reviewed: 03/30/2013 Elsevier Interactive Patient Education 2016 [employer]     IF you received an x-ray today, you  will receive an invoice from [facility]. Please contact [facility] at [phone] with questions or concerns regarding your invoice.   IF you received labwork today, you will receive an invoice from [facility]. Please contact [facility] at [phone] with questions or concerns regarding your invoice.   Our billing staff will not be able to assist you with questions regarding bills from these companies.  You will be contacted with the lab results as soon as they are available. The fastest way to get your results is to activate your My Chart account. Instructions are located on the last page of this paperwork. If you have not heard from us regarding the results in 2 weeks, please contact this office.

## 2016-05-25 NOTE — Progress Notes (Signed)
    MRN: [id number] DOB: 10/31/1956  Subjective:   Dustin Mckee is a 60 y.o. male presenting for follow up on BPH.   Has history of BPH managed well with Flomax. He has had difficulty with weakened stream, straining to urinate. Denies fever, weight loss, pelvic pain, post-mic dribble, dysuria, hematuria.   Dustin Mckee has a current medication list which includes the following prescription(s): cyclobenzaprine, hydrochlorothiazide, prednisone, and tamsulosin. Also is allergic to iodine.  Dustin Mckee  has a past medical history of Hyperlipidemia and Allergy. Also  has past surgical history that includes Appendectomy and Tooth extraction.  Objective:   Vitals: BP 130/70 mmHg  Pulse 89  Temp(Src) 98.2 F (36.8 C) (Oral)  Resp 17  Ht 6' 2.5" (1.892 m)  Wt 273 lb (123.832 kg)  BMI 34.59 kg/m2  SpO2 96%  Physical Exam  Constitutional: He is oriented to person, place, and time. He appears well-developed and well-nourished.  Cardiovascular: Normal rate.   Pulmonary/Chest: Effort normal.  Neurological: He is alert and oriented to person, place, and time.   Assessment and Plan :   1. BPH (benign prostatic hyperplasia) - Stable, refilled Flomax, patient declined PSA and DRE. He plans on coming back in 1 month for an annual exam.  Wallis BambergMario Bryla Burek, PA-C [facility] [phone] 05/25/2016 11:54 AM

## 2016-05-28 DIAGNOSIS — R9439 Abnormal result of other cardiovascular function study: Secondary | ICD-10-CM | POA: Diagnosis not present

## 2016-05-29 DIAGNOSIS — M9902 Segmental and somatic dysfunction of thoracic region: Secondary | ICD-10-CM | POA: Diagnosis not present

## 2016-05-29 DIAGNOSIS — M9901 Segmental and somatic dysfunction of cervical region: Secondary | ICD-10-CM | POA: Diagnosis not present

## 2016-05-29 DIAGNOSIS — M531 Cervicobrachial syndrome: Secondary | ICD-10-CM | POA: Diagnosis not present

## 2016-05-31 ENCOUNTER — Telehealth: Payer: Self-pay

## 2016-05-31 DIAGNOSIS — M9902 Segmental and somatic dysfunction of thoracic region: Secondary | ICD-10-CM | POA: Diagnosis not present

## 2016-05-31 DIAGNOSIS — M9901 Segmental and somatic dysfunction of cervical region: Secondary | ICD-10-CM | POA: Diagnosis not present

## 2016-05-31 DIAGNOSIS — M531 Cervicobrachial syndrome: Secondary | ICD-10-CM | POA: Diagnosis not present

## 2016-05-31 DIAGNOSIS — R0789 Other chest pain: Secondary | ICD-10-CM | POA: Diagnosis not present

## 2016-05-31 DIAGNOSIS — Z8249 Family history of ischemic heart disease and other diseases of the circulatory system: Secondary | ICD-10-CM | POA: Diagnosis not present

## 2016-05-31 DIAGNOSIS — E782 Mixed hyperlipidemia: Secondary | ICD-10-CM | POA: Diagnosis not present

## 2016-05-31 DIAGNOSIS — I10 Essential (primary) hypertension: Secondary | ICD-10-CM | POA: Diagnosis not present

## 2016-05-31 NOTE — Telephone Encounter (Signed)
Pt is needing to get a copy of neck xray and would need to pick them up this afternoon  Best number [phone]

## 2016-06-03 NOTE — Telephone Encounter (Signed)
Left VM for pt to call back if still requesting CD of xray

## 2016-06-15 DIAGNOSIS — M9902 Segmental and somatic dysfunction of thoracic region: Secondary | ICD-10-CM | POA: Diagnosis not present

## 2016-06-15 DIAGNOSIS — M531 Cervicobrachial syndrome: Secondary | ICD-10-CM | POA: Diagnosis not present

## 2016-06-15 DIAGNOSIS — M9901 Segmental and somatic dysfunction of cervical region: Secondary | ICD-10-CM | POA: Diagnosis not present

## 2016-06-22 DIAGNOSIS — M531 Cervicobrachial syndrome: Secondary | ICD-10-CM | POA: Diagnosis not present

## 2016-06-22 DIAGNOSIS — M9901 Segmental and somatic dysfunction of cervical region: Secondary | ICD-10-CM | POA: Diagnosis not present

## 2016-06-22 DIAGNOSIS — M9902 Segmental and somatic dysfunction of thoracic region: Secondary | ICD-10-CM | POA: Diagnosis not present

## 2016-06-25 MED FILL — TAMSULOSIN HCL 0.4 MG CAP: 0.4 | 90 days supply | Qty: 90 | Fill #0 | Status: TO

## 2016-06-29 DIAGNOSIS — M9901 Segmental and somatic dysfunction of cervical region: Secondary | ICD-10-CM | POA: Diagnosis not present

## 2016-06-29 DIAGNOSIS — M9902 Segmental and somatic dysfunction of thoracic region: Secondary | ICD-10-CM | POA: Diagnosis not present

## 2016-06-29 DIAGNOSIS — M531 Cervicobrachial syndrome: Secondary | ICD-10-CM | POA: Diagnosis not present

## 2016-07-05 MED FILL — ROSUVASTATIN CALCIUM 10 MG: 10 | 30 days supply | Qty: 30 | Fill #0

## 2016-07-06 DIAGNOSIS — M9901 Segmental and somatic dysfunction of cervical region: Secondary | ICD-10-CM | POA: Diagnosis not present

## 2016-07-06 DIAGNOSIS — M9902 Segmental and somatic dysfunction of thoracic region: Secondary | ICD-10-CM | POA: Diagnosis not present

## 2016-07-06 DIAGNOSIS — M531 Cervicobrachial syndrome: Secondary | ICD-10-CM | POA: Diagnosis not present

## 2016-07-13 DIAGNOSIS — M9902 Segmental and somatic dysfunction of thoracic region: Secondary | ICD-10-CM | POA: Diagnosis not present

## 2016-07-13 DIAGNOSIS — M531 Cervicobrachial syndrome: Secondary | ICD-10-CM | POA: Diagnosis not present

## 2016-07-13 DIAGNOSIS — M9901 Segmental and somatic dysfunction of cervical region: Secondary | ICD-10-CM | POA: Diagnosis not present

## 2016-08-01 MED FILL — ROSUVASTATIN CALCIUM 10 MG: 10 | 30 days supply | Qty: 30 | Fill #0

## 2016-08-01 MED FILL — LISINOPRIL-HCTZ 20-12.5 MG: 20-12.5 | 90 days supply | Qty: 90 | Fill #0

## 2016-09-30 MED FILL — TAMSULOSIN HCL 0.4 MG CAP: 0.4 | 90 days supply | Qty: 90 | Fill #0

## 2016-10-02 MED FILL — ROSUVASTATIN CALCIUM 10 MG: 10 | 30 days supply | Qty: 30 | Fill #1

## 2016-11-08 ENCOUNTER — Ambulatory Visit (INDEPENDENT_AMBULATORY_CARE_PROVIDER_SITE_OTHER): Payer: 59 | Admitting: Family Medicine

## 2016-11-08 VITALS — BP 126/66 | HR 68 | Temp 98.0°F | Resp 16 | Ht 73.0 in | Wt 269.8 lb

## 2016-11-08 DIAGNOSIS — R3912 Poor urinary stream: Secondary | ICD-10-CM | POA: Diagnosis not present

## 2016-11-08 DIAGNOSIS — Z0000 Encounter for general adult medical examination without abnormal findings: Secondary | ICD-10-CM

## 2016-11-08 DIAGNOSIS — N401 Enlarged prostate with lower urinary tract symptoms: Secondary | ICD-10-CM

## 2016-11-08 DIAGNOSIS — Z13220 Encounter for screening for lipoid disorders: Secondary | ICD-10-CM

## 2016-11-08 DIAGNOSIS — Z23 Encounter for immunization: Secondary | ICD-10-CM | POA: Diagnosis not present

## 2016-11-08 DIAGNOSIS — I10 Essential (primary) hypertension: Secondary | ICD-10-CM

## 2016-11-08 DIAGNOSIS — R5383 Other fatigue: Secondary | ICD-10-CM | POA: Diagnosis not present

## 2016-11-08 MED ORDER — TAMSULOSIN HCL 0.4 MG PO CAPS: 0.8000 mg | ORAL_CAPSULE | Freq: Every day | ORAL | 3 refills | Status: DC

## 2016-11-08 MED ORDER — HYDROCHLOROTHIAZIDE 12.5 MG PO CAPS: 12.5000 mg | ORAL_CAPSULE | Freq: Every day | ORAL | 3 refills | Status: DC

## 2016-11-08 MED FILL — HYDROCHLOROTHIAZIDE 12.5 MG: 12.5 | 90 days supply | Qty: 90 | Fill #0

## 2016-11-08 MED FILL — LISINOPRIL-HCTZ 20-12.5 MG: 20-12.5 | 90 days supply | Qty: 90 | Fill #1

## 2016-11-08 MED FILL — ROSUVASTATIN CALCIUM 10 MG: 10 | 90 days supply | Qty: 90 | Fill #0

## 2016-11-08 NOTE — Patient Instructions (Addendum)
Flomax increased 0.4 to 0.8  Daily  Your lab results will be uploaded to my chart.   IF you received an x-ray today, you will receive an invoice from [facility]. Please contact [facility] at [phone] with questions or concerns regarding your invoice.   IF you received labwork today, you will receive an invoice from [facility]. Please contact [facility] at [phone] with questions or concerns regarding your invoice.   Our billing staff will not be able to assist you with questions regarding bills from these companies.  You will be contacted with the lab results as soon as they are available. The fastest way to get your results is to activate your My Chart account. Instructions are located on the last page of this paperwork. If you have not heard from us regarding the results in 2 weeks, please contact this office.     Fatigue Introduction Fatigue is feeling tired all of the time, a lack of energy, or a lack of motivation. Occasional or mild fatigue is often a normal response to activity or life in general. However, long-lasting (chronic) or extreme fatigue may indicate an underlying medical condition. Follow these instructions at home: Watch your fatigue for any changes. The following actions may help to lessen any discomfort you are feeling:  Talk to your health care provider about how much sleep you need each night. Try to get the required amount every night.  Take medicines only as directed by your health care provider.  Eat a healthy and nutritious diet. Ask your health care provider if you need help changing your diet.  Drink enough fluid to keep your urine clear or pale yellow.  Practice ways of relaxing, such as yoga, meditation, massage therapy, or acupuncture.  Exercise regularly.  Change situations that cause you stress. Try to keep your work and personal routine reasonable.  Do not abuse illegal drugs.  Limit alcohol intake to no more than 1 drink  per day for nonpregnant women and 2 drinks per day for men. One drink equals 12 ounces of beer, 5 ounces of wine, or 1 ounces of hard liquor.  Take a multivitamin, if directed by your health care provider. Contact a health care provider if:  Your fatigue does not get better.  You have a fever.  You have unintentional weight loss or gain.  You have headaches.  You have difficulty:  Falling asleep.  Sleeping throughout the night.  You feel angry, guilty, anxious, or sad.  You are unable to have a bowel movement (constipation).  You skin is dry.  Your legs or another part of your body is swollen. Get help right away if:  You feel confused.  Your vision is blurry.  You feel faint or pass out.  You have a severe headache.  You have severe abdominal, pelvic, or back pain.  You have chest pain, shortness of breath, or an irregular or fast heartbeat.  You are unable to urinate or you urinate less than normal.  You develop abnormal bleeding, such as bleeding from the rectum, vagina, nose, lungs, or nipples.  You vomit blood.  You have thoughts about harming yourself or committing suicide.  You are worried that you might harm someone else. This information is not intended to replace advice given to you by your health care provider. Make sure you discuss any questions you have with your health care provider. Document Released: 08/25/2007 Document Revised: 04/04/2016 Document Reviewed: 03/01/2014  2017 Elsevier

## 2016-11-08 NOTE — Progress Notes (Signed)
Patient ID: Dustin Mckee, male    DOB: 10/13/1956, 60 y.o.   MRN: [id number]  PCP: Tonye PearsonOLITTLE, ROBERT P, MD (Inactive)  Chief Complaint  Patient presents with  . Annual Exam    Subjective:  HPI-Presents New To Provider 60 year old male presents for a complete annual physical exam. Pt received his last annual exam in 2016 and was seen for routine visit last in July 2017.  Chronic problems include: BPH, HTN, OSA, and hyperlipidemia.  Current concerns include:  Urinary Retention: Complains of worsening weakness of urine stream. Urine stream initially improved with initiation of Fllomax 0.4, although he feels like the medication should be increased to effectively improve his symptoms. No family hx of prostate cancer. Last PSA was normal. Denies any low back, scrotal or testicle fullness or pain.  Hypertension: No routine physical exercise, only works around the house. Avoid salt and watch diet. No chest palpation.  Recent eye exam (-) diabetes symptoms, polyphagia, polydipsia, or polyuria. No chest palpation or headaches.   Social History   Social History  . Marital status: Married    Spouse name: Dustin Mckee  . Number of children: N/A  . Years of education: N/A   Occupational History  . Not on file.   Social History Main Topics  . Smoking status: Never Smoker  . Smokeless tobacco: Never Used  . Alcohol use No  . Drug use: No  . Sexual activity: Yes    Birth control/ protection: None   Other Topics Concern  . Not on file   Social History Narrative   Drinks 1-2 cups caffeine daily.    Family History  Problem Relation Age of Onset  . Heart disease Mother   . Kidney disease Mother   . Heart disease Father     defibrillator/CHF.  . Diabetes Father   . Heart disease Sister     CHF  . Diabetes Sister   . Heart disease Sister     CHF   Review of Systems  Constitutional: Negative.   HENT: Negative.   Eyes: Negative.   Respiratory: Negative.   Endocrine:  Negative.   Genitourinary: Positive for difficulty urinating and urgency. Negative for decreased urine volume, discharge and penile pain.       Weak urinary stream   Musculoskeletal:       Spinal radiculopathy no recent flares  Neurological: Negative.   Hematological: Negative.   Psychiatric/Behavioral: Negative.      Patient Active Problem List   Diagnosis Date Noted  . Hyperlipidemia 09/05/2014  . Unspecified vitamin D deficiency 06/14/2014  . BPH (benign prostatic hypertrophy) with urinary obstruction 06/14/2014  . GERD 01/23/2009  . HYPERLIPIDEMIA 12/14/2008  . ALLERGIC RHINITIS 12/14/2008  . SLEEP APNEA 11/29/2008  . DYSPNEA 11/29/2008    Allergies  Allergen Reactions  . Iodine Hives    Prior to Admission medications   Medication Sig Start Date End Date Taking? Authorizing Provider  cyclobenzaprine (FLEXERIL) 5 MG tablet 1 pill by mouth up to every 8 hours as needed. Start with one pill by mouth each bedtime as needed due to sedation 04/17/16  Yes Shade FloodJeffrey R Greene, MD  hydrochlorothiazide (MICROZIDE) 12.5 MG capsule Take 1 capsule (12.5 mg total) by mouth daily. 04/17/16  Yes Shade FloodJeffrey R Greene, MD  tamsulosin (FLOMAX) 0.4 MG CAPS capsule Take 1 capsule (0.4 mg total) by mouth daily. 05/25/16  Yes Wallis BambergMario Mani, PA-C  predniSONE (DELTASONE) 20 MG tablet 3 by mouth for 3 days, then 2 by mouth  for 2 days, then 1 by mouth for 2 days, then 1/2 by mouth for 2 days. Patient not taking: Reported on 11/08/2016 05/03/16   Shade FloodJeffrey R Greene, MD    Past Medical, Surgical Family and Social History reviewed and updated.    Objective:   Today's Vitals   11/08/16 0851  BP: 126/66  Pulse: 68  Resp: 16  Temp: 98 F (36.7 C)  TempSrc: Oral  SpO2: 96%  Weight: 269 lb 12.8 oz (122.4 kg)  Height: 6\' 1"  (1.854 m)    Wt Readings from Last 3 Encounters:  11/08/16 269 lb 12.8 oz (122.4 kg)  05/25/16 273 lb (123.8 kg)  05/03/16 283 lb 6.4 oz (128.5 kg)   Physical Exam  Constitutional: He  is oriented to person, place, and time. He appears well-developed and well-nourished.  HENT:  Head: Normocephalic and atraumatic.  Right Ear: External ear normal.  Left Ear: External ear normal.  Nose: Nose normal.  Mouth/Throat: Oropharynx is clear and moist.  Eyes: Conjunctivae and EOM are normal. Pupils are equal, round, and reactive to light.  Neck: Normal range of motion. Neck supple.  Cardiovascular: Normal rate, regular rhythm, normal heart sounds and intact distal pulses.   Pulmonary/Chest: Effort normal and breath sounds normal.  Abdominal: Soft. Bowel sounds are normal. He exhibits mass. He exhibits no distension.  Musculoskeletal: Normal range of motion.  Neurological: He is alert and oriented to person, place, and time. He has normal reflexes.  Skin: Skin is warm and dry.  Psychiatric: He has a normal mood and affect. His behavior is normal. Judgment and thought content normal.      Assessment & Plan:  1. Annual physical exam Age appropriate anticipatory guidance provided  2. Need for prophylactic vaccination and inoculation against influenza - Flu Vaccine QUAD 36+ mos IM  3. Benign prostatic hyperplasia with weak urinary stream - CBC with Differential/Platelet - PSA -Increase Tamsulosin (Flomax) 0.4 to 0.8 mg daily.  -Return for follow-up if symptoms do not improve with increased dosage.  4. Essential hypertension - Comprehensive metabolic panel - hydrochlorothiazide (MICROZIDE) 12.5 MG capsule; Take 1 capsule (12.5 mg total) by mouth daily.  Dispense: 90 capsule; Refill: 3  5. Fatigue, unspecified type - TSH - VITAMIN D 25 Hydroxy (Vit-D Deficiency, Fractures) - Testosterone Free, Profile I  6. Screening, lipid - Lipid panel  You will be notified of your lab results.  Godfrey PickKimberly S. Tiburcio PeaHarris, MSN, FNP-C [facility] [phone]

## 2016-11-09 LAB — COMPREHENSIVE METABOLIC PANEL
A/G RATIO: 1.8 (ref 1.2–2.2)
ALK PHOS: 42 IU/L (ref 39–117)
ALT: 23 IU/L (ref 0–44)
AST: 18 IU/L (ref 0–40)
Albumin: 4.8 g/dL (ref 3.5–5.5)
BILIRUBIN TOTAL: 0.3 mg/dL (ref 0.0–1.2)
BUN / CREAT RATIO: 16 (ref 9–20)
BUN: 18 mg/dL (ref 6–24)
CHLORIDE: 99 mmol/L (ref 96–106)
CO2: 27 mmol/L (ref 18–29)
CREATININE: 1.12 mg/dL (ref 0.76–1.27)
Calcium: 9.9 mg/dL (ref 8.7–10.2)
GFR calc Af Amer: 83 mL/min/{1.73_m2} (ref >59)
GFR calc non Af Amer: 72 mL/min/{1.73_m2} (ref >59)
GLOBULIN, TOTAL: 2.6 g/dL (ref 1.5–4.5)
Glucose: 101 mg/dL — ABNORMAL HIGH (ref 65–99)
POTASSIUM: 4.6 mmol/L (ref 3.5–5.2)
SODIUM: 141 mmol/L (ref 134–144)
Total Protein: 7.4 g/dL (ref 6.0–8.5)

## 2016-11-09 LAB — LIPID PANEL
CHOLESTEROL TOTAL: 217 mg/dL — AB (ref 100–199)
Chol/HDL Ratio: 5 ratio units (ref 0.0–5.0)
HDL: 43 mg/dL (ref >39)
LDL Calculated: 132 mg/dL — ABNORMAL HIGH (ref 0–99)
Triglycerides: 210 mg/dL — ABNORMAL HIGH (ref 0–149)
VLDL Cholesterol Cal: 42 mg/dL — ABNORMAL HIGH (ref 5–40)

## 2016-11-09 LAB — CBC WITH DIFFERENTIAL/PLATELET
Basophils Absolute: 0 10*3/uL (ref 0.0–0.2)
Basos: 0 %
EOS (ABSOLUTE): 0.1 10*3/uL (ref 0.0–0.4)
EOS: 2 %
HEMATOCRIT: 44.7 % (ref 37.5–51.0)
Hemoglobin: 15.4 g/dL (ref 13.0–17.7)
Immature Grans (Abs): 0 10*3/uL (ref 0.0–0.1)
Immature Granulocytes: 0 %
LYMPHS ABS: 2 10*3/uL (ref 0.7–3.1)
Lymphs: 39 %
MCH: 29.4 pg (ref 26.6–33.0)
MCHC: 34.5 g/dL (ref 31.5–35.7)
MCV: 85 fL (ref 79–97)
MONOS ABS: 0.3 10*3/uL (ref 0.1–0.9)
Monocytes: 6 %
NEUTROS ABS: 2.9 10*3/uL (ref 1.4–7.0)
Neutrophils: 53 %
Platelets: 223 10*3/uL (ref 150–379)
RBC: 5.24 x10E6/uL (ref 4.14–5.80)
RDW: 14.4 % (ref 12.3–15.4)
WBC: 5.3 10*3/uL (ref 3.4–10.8)

## 2016-11-09 LAB — TSH: TSH: 2.8 u[IU]/mL (ref 0.450–4.500)

## 2016-11-09 LAB — PSA: PROSTATE SPECIFIC AG, SERUM: 2.4 ng/mL (ref 0.0–4.0)

## 2016-11-09 LAB — VITAMIN D 25 HYDROXY (VIT D DEFICIENCY, FRACTURES): VIT D 25 HYDROXY: 16.2 ng/mL — AB (ref 30.0–100.0)

## 2016-11-13 MED ORDER — VITAMIN D (ERGOCALCIFEROL) 1.25 MG (50000 UNIT) PO CAPS: 50000.0000 [IU] | ORAL_CAPSULE | ORAL | 0 refills | Status: DC

## 2016-11-14 MED FILL — VIT D2 1.25 MG (50,000 UNIT: 1.25 MG | 84 days supply | Qty: 12 | Fill #0

## 2016-11-18 ENCOUNTER — Encounter: Payer: Self-pay | Admitting: Family Medicine

## 2016-11-18 DIAGNOSIS — Z23 Encounter for immunization: Secondary | ICD-10-CM | POA: Diagnosis not present

## 2016-11-19 IMAGING — DX DG CERVICAL SPINE COMPLETE 4+V
6 series · 6 of 6 positions shown · non-contrast
Comparison: None in PACs

CLINICAL DATA: Onset of Nala Tiger in the neck 5 days ago with
subsequent development of left shoulder and arm pain and numbness

EXAM:
CERVICAL SPINE - COMPLETE 4+ VIEW

[c-spine lat]
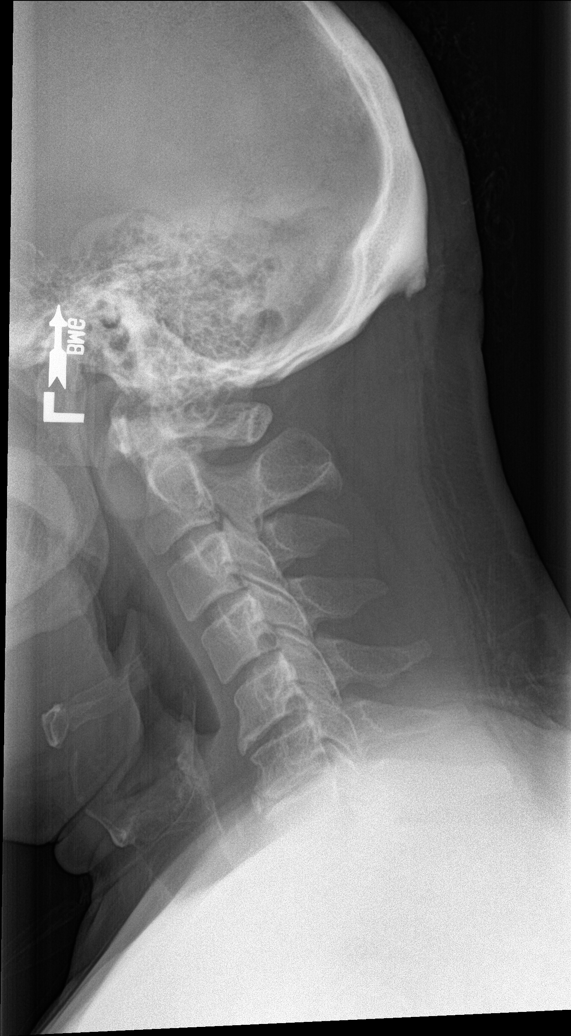

[c-spine obl (1 of 2)]
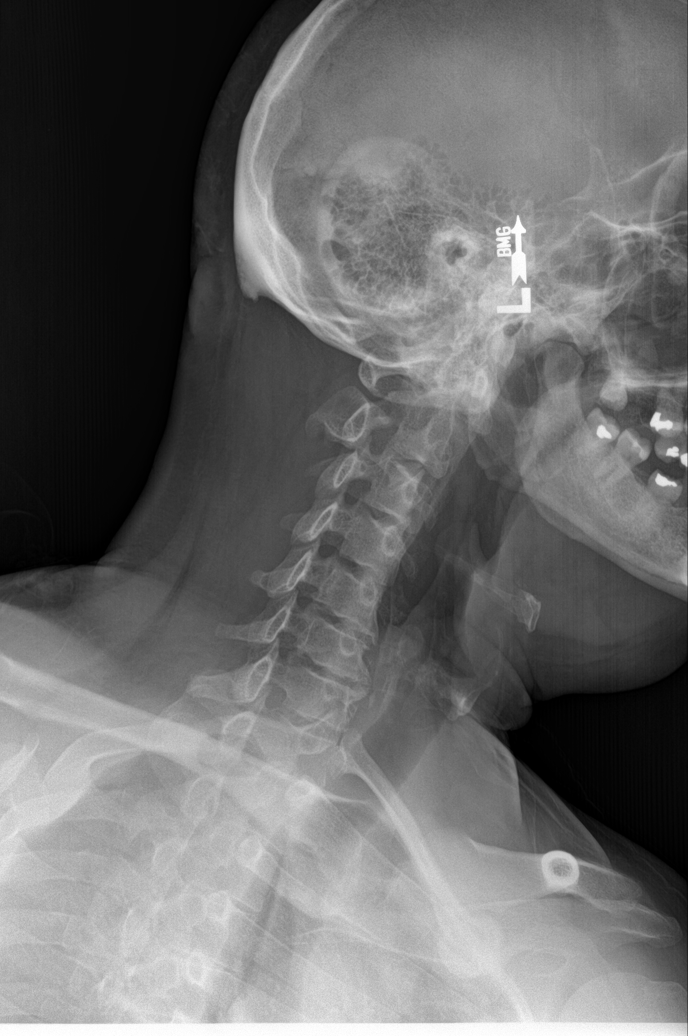

[c-spine obl (2 of 2)]
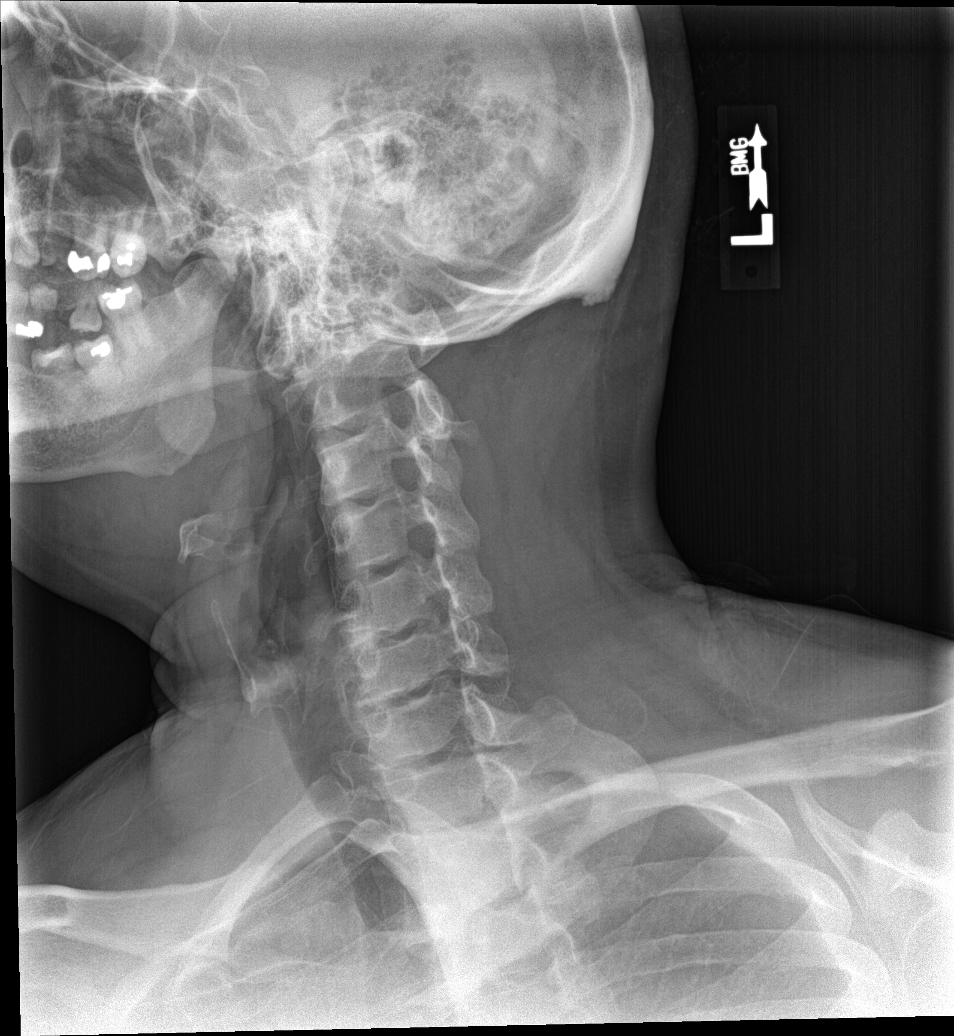

[c-spine ap]
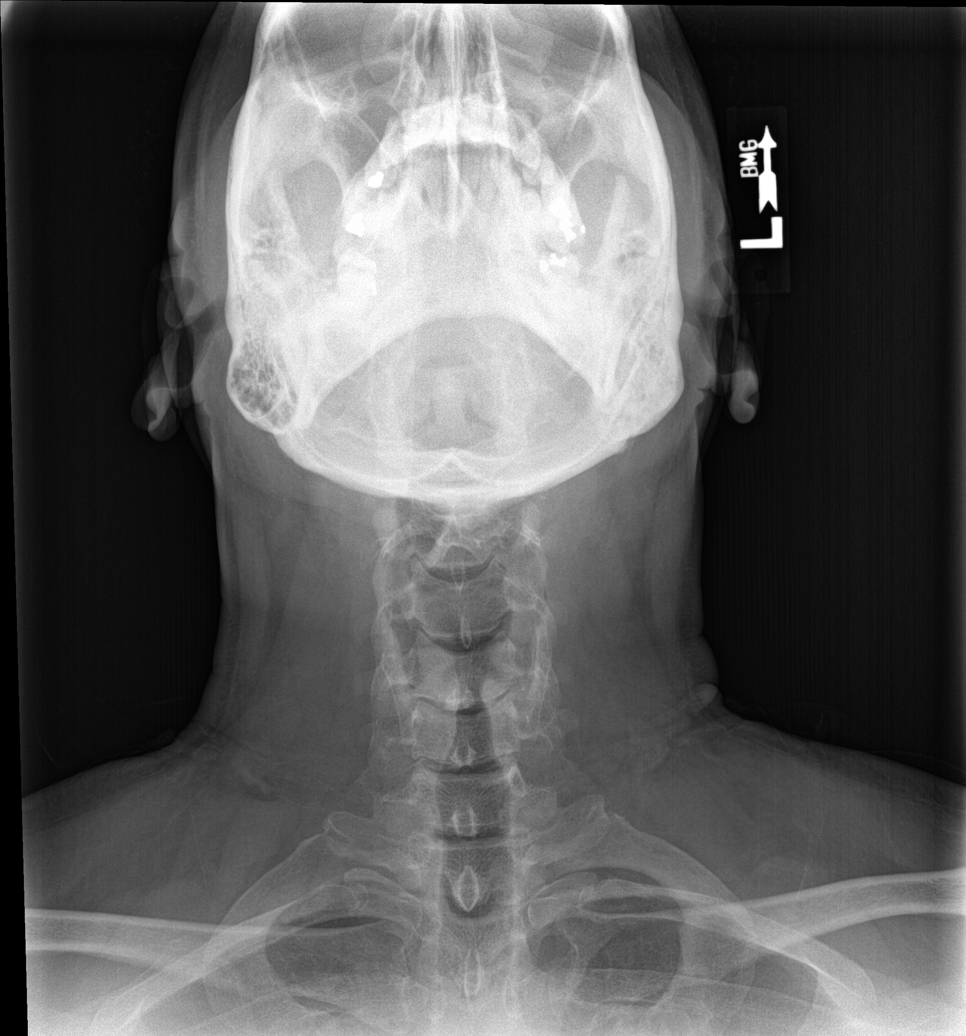

[c-spine open mouth]
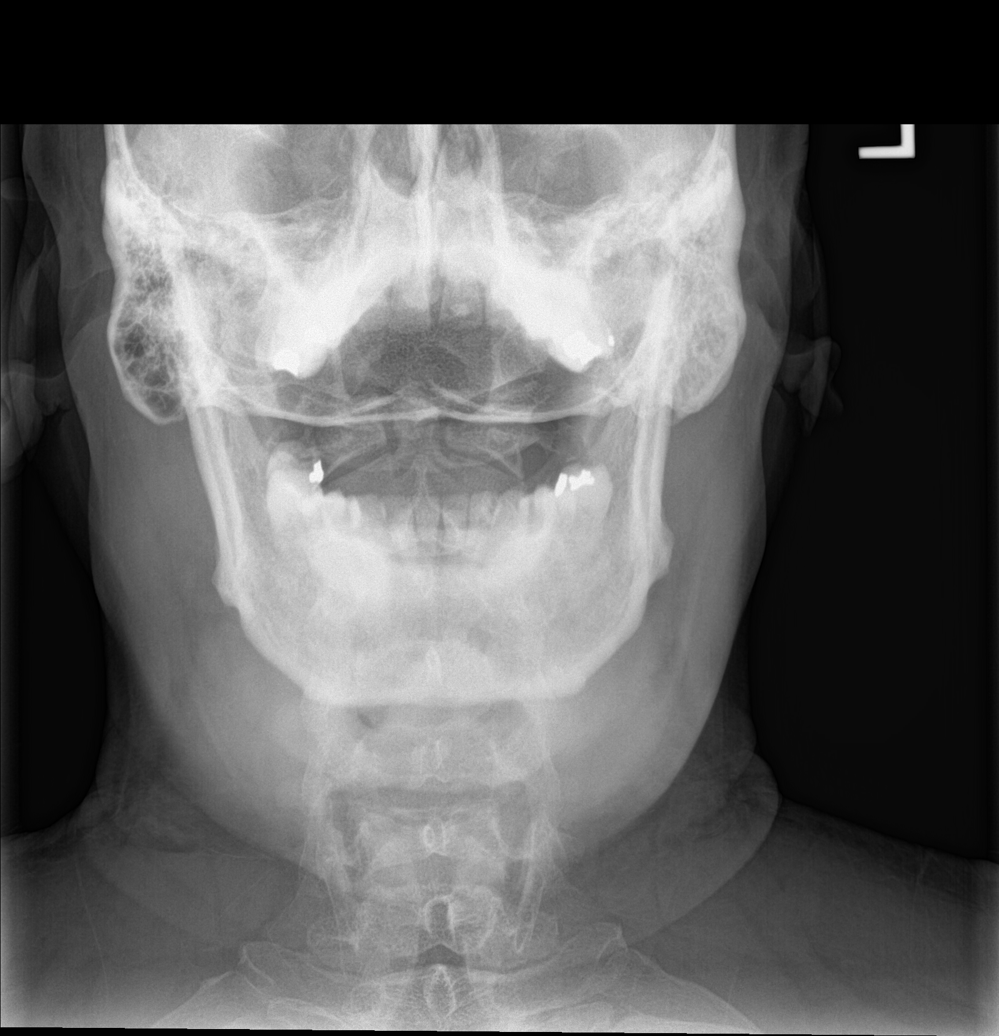

[c-spine swimmers]
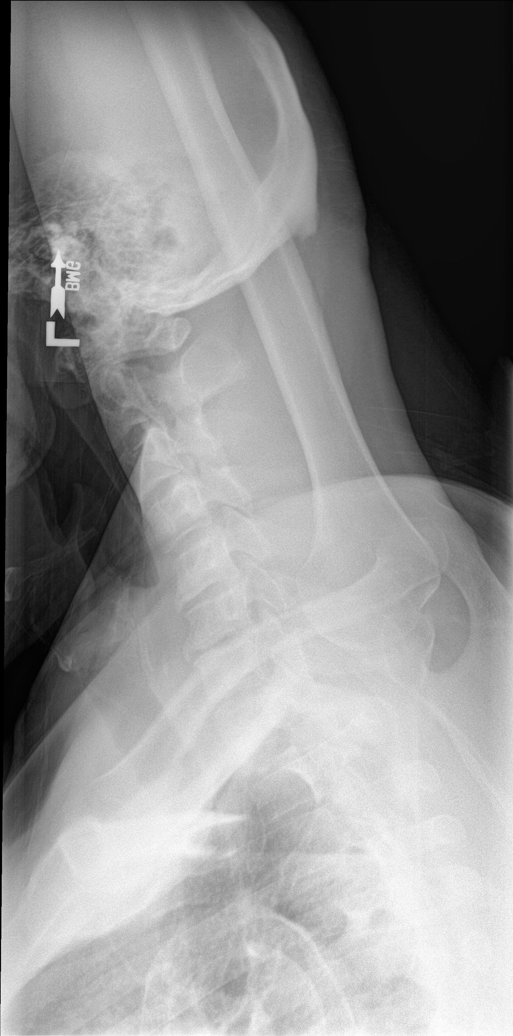

[6 of 6 positions shown; findings below may reference images not displayed]

FINDINGS: There is loss of the normal cervical lordosis. The vertebral bodies
are preserved in height. There is disc space narrowing at C5-6 and
C6-7 with anterior near bridging osteophytes. There is no perched
facet. There is mild bony encroachment upon the left C5 and C6 and
C7 neural foramina due to facet joint and uncovertebral joint
osteophyte. The odontoid is intact. The prevertebral soft tissue
spaces appear normal.
IMPRESSION: There is degenerative disc, facet joint, and uncovertebral joint
change centered at C5-6 and C6-7 with impact upon the left neural
foramina at C5, C6, and C7. There is no compression fracture.

Given the patient's radicular symptoms, cervical spine MRI may be
useful.

## 2016-11-29 ENCOUNTER — Encounter: Payer: Self-pay | Admitting: Family Medicine

## 2016-11-29 DIAGNOSIS — R5383 Other fatigue: Secondary | ICD-10-CM

## 2016-11-29 NOTE — Telephone Encounter (Signed)
Pended future order for testosterone. [facility] collected and received serum specimen although the test was never ran. Lab is contacting patient to have him come in for us to re-test.

## 2016-12-05 ENCOUNTER — Telehealth: Payer: Self-pay

## 2016-12-05 MED FILL — TAMSULOSIN HCL 0.4 MG CAP: 0.4 | 45 days supply | Qty: 90 | Fill #0

## 2016-12-05 NOTE — Telephone Encounter (Signed)
THIS MESSAGE IS FROM ELIZABETH AT [facility]: KIMBERLY HARRIS INCREASED PATIENT'S FLOMAX FROM 1 CAPSULE TO 2 CAPSULES BUT THE QUANITY STILL SAYS TAKE 1 PER DAY WITH #90. SHE WOULD LIKE THE QUANITY CLARIFIED PLEASE. BEST PHONE [phone] (PLEASE ASK FOR ELIZABETH) MBC

## 2016-12-06 DIAGNOSIS — Z8249 Family history of ischemic heart disease and other diseases of the circulatory system: Secondary | ICD-10-CM | POA: Diagnosis not present

## 2016-12-06 DIAGNOSIS — I10 Essential (primary) hypertension: Secondary | ICD-10-CM | POA: Diagnosis not present

## 2016-12-06 DIAGNOSIS — E782 Mixed hyperlipidemia: Secondary | ICD-10-CM | POA: Diagnosis not present

## 2016-12-06 MED ORDER — TAMSULOSIN HCL 0.4 MG PO CAPS: 0.8000 mg | ORAL_CAPSULE | Freq: Every day | ORAL | 3 refills | Status: DC

## 2016-12-27 MED FILL — ROSUVASTATIN CALCIUM 20 MG: 20 | 90 days supply | Qty: 90 | Fill #0

## 2016-12-30 ENCOUNTER — Other Ambulatory Visit: Payer: 59

## 2016-12-30 DIAGNOSIS — R5383 Other fatigue: Secondary | ICD-10-CM

## 2016-12-31 LAB — TESTOSTERONE FREE, PROFILE I
ALBUMIN: 4.6 g/dL (ref 3.5–5.5)
Sex Hormone Binding: 22.1 nmol/L (ref 19.3–76.4)
TESTOSTERONE: 87 ng/dL — AB (ref 264–916)
Testost., Free, Calc: 19 pg/mL — ABNORMAL LOW (ref 35.8–168.2)

## 2017-01-01 ENCOUNTER — Encounter: Payer: Self-pay | Admitting: Family Medicine

## 2017-01-01 DIAGNOSIS — G4733 Obstructive sleep apnea (adult) (pediatric): Secondary | ICD-10-CM | POA: Diagnosis not present

## 2017-01-01 NOTE — Progress Notes (Unsigned)
Advised pt to schedule OV to discuss testosterone replacement.

## 2017-01-06 ENCOUNTER — Ambulatory Visit (INDEPENDENT_AMBULATORY_CARE_PROVIDER_SITE_OTHER): Payer: 59 | Admitting: Family Medicine

## 2017-01-06 VITALS — BP 121/76 | HR 67 | Temp 98.2°F | Ht 74.0 in | Wt 270.8 lb

## 2017-01-06 DIAGNOSIS — Z9989 Dependence on other enabling machines and devices: Secondary | ICD-10-CM | POA: Diagnosis not present

## 2017-01-06 DIAGNOSIS — G4733 Obstructive sleep apnea (adult) (pediatric): Secondary | ICD-10-CM | POA: Diagnosis not present

## 2017-01-06 DIAGNOSIS — R7989 Other specified abnormal findings of blood chemistry: Secondary | ICD-10-CM

## 2017-01-06 DIAGNOSIS — E559 Vitamin D deficiency, unspecified: Secondary | ICD-10-CM

## 2017-01-06 DIAGNOSIS — E349 Endocrine disorder, unspecified: Secondary | ICD-10-CM | POA: Diagnosis not present

## 2017-01-06 DIAGNOSIS — E785 Hyperlipidemia, unspecified: Secondary | ICD-10-CM

## 2017-01-06 NOTE — Patient Instructions (Addendum)
For low testosterone, I will refer you to an endocrinologist to look into whether you need other testing such as an MRI with the low reading.   For sleep apnea, it appears they wanted to schedule you for a CPAP titration. Call their office tomorrow to determine the next step.    Plan on follow up in next 4-5 weeks for fasting labs to recheck cholesterol and vitamin D.    IF you received an x-ray today, you will receive an invoice from [facility]. Please contact [facility] at [phone] with questions or concerns regarding your invoice.   IF you received labwork today, you will receive an invoice from [facility]. Please contact [facility] at [phone] with questions or concerns regarding your invoice.   Our billing staff will not be able to assist you with questions regarding bills from these companies.  You will be contacted with the lab results as soon as they are available. The fastest way to get your results is to activate your My Chart account. Instructions are located on the last page of this paperwork. If you have not heard from us regarding the results in 2 weeks, please contact this office.

## 2017-01-06 NOTE — Progress Notes (Signed)
By signing my name below, I, [name], attest that this documentation has been prepared under the direction and in the presence of [name], MD.  Electronically Signed: Arvilla Market, Medical Scribe. 01/06/17. 6:40 PM.  Subjective:    Patient ID: Dustin Mckee, male    DOB: Jun 02, 1956, 61 y.o.   MRN: [id number]  HPI Chief Complaint  Patient presents with  . Follow-up    lab results    HPI Comments: Dustin Mckee is a 61 y.o. male who presents to the [facility] for blood work follow-up. Last seen by Joaquin Courts, FNP, on Dec 29th 2017. He had a testosterone level drawn 1 week ago at 11:39 am. Testosterone level 87, with free testosterone 19. He was also treated for low Vit D in December with 15,000 units per week for 12 weeks - level was 16.2 at that time.  Testosterone Level: Pt had low testosterone level around 2010 or 2011 and used a testosterone gel - has discontinued since. He had his levels checked in Dec due to fatigue. Pt hasn't went to an endocrinologist but would like to get checked up by one. Denies HA, and diplopia.  Vit D: Pt has been compliant with his Vit D supplements and has 4-5 weeks left.  Sleep Apnea: Dx 14 years ago. Pt needs a new machine and was told he needs a PCP to rx a new machine. Pt still uses it but it's "very old with some functional problems". Pt went to Dr. Vickey Huger 09/19/2015 to schedule for a home sleep test. Home test he had a AHI 28.6 with desaturation to 82 % Phone note from 10/2015, pt was recommended CPAP titration study  Pt went to his cardiologist and he increased his cholesterol medication.  Patient Active Problem List   Diagnosis Date Noted  . Hyperlipidemia 09/05/2014  . Unspecified vitamin D deficiency 06/14/2014  . BPH (benign prostatic hypertrophy) with urinary obstruction 06/14/2014  . GERD 01/23/2009  . HYPERLIPIDEMIA 12/14/2008  . ALLERGIC RHINITIS 12/14/2008  . SLEEP APNEA 11/29/2008  .  DYSPNEA 11/29/2008   Past Medical History:  Diagnosis Date  . Allergy   . Hyperlipidemia    Past Surgical History:  Procedure Laterality Date  . APPENDECTOMY    . TOOTH EXTRACTION     Allergies  Allergen Reactions  . Iodine Hives   Prior to Admission medications   Medication Sig Start Date End Date Taking? Authorizing Provider  cyclobenzaprine (FLEXERIL) 5 MG tablet 1 pill by mouth up to every 8 hours as needed. Start with one pill by mouth each bedtime as needed due to sedation 04/17/16   Shade Flood, MD  hydrochlorothiazide (MICROZIDE) 12.5 MG capsule Take 1 capsule (12.5 mg total) by mouth daily. 11/08/16   Doyle Askew, FNP  predniSONE (DELTASONE) 20 MG tablet 3 by mouth for 3 days, then 2 by mouth for 2 days, then 1 by mouth for 2 days, then 1/2 by mouth for 2 days. Patient not taking: Reported on 11/08/2016 05/03/16   Shade Flood, MD  tamsulosin (FLOMAX) 0.4 MG CAPS capsule Take 2 capsules (0.8 mg total) by mouth daily. 12/06/16   Doyle Askew, FNP  Vitamin D, Ergocalciferol, (DRISDOL) 50000 units CAPS capsule Take 1 capsule (50,000 Units total) by mouth every 7 (seven) days. 11/13/16   Doyle Askew, FNP   Social History   Social History  . Marital status: Married    Spouse name: [name]  . Number  of children: N/A  . Years of education: N/A   Occupational History  . Not on file.   Social History Main Topics  . Smoking status: Never Smoker  . Smokeless tobacco: Never Used  . Alcohol use No  . Drug use: No  . Sexual activity: Yes    Birth control/ protection: None   Other Topics Concern  . Not on file   Social History Narrative   Drinks 1-2 cups caffeine daily.   Review of Systems  Eyes: Negative for visual disturbance.  Neurological: Negative for headaches.   Objective:  Physical Exam  Constitutional: He appears well-developed and well-nourished. No distress.  HENT:  Head: Normocephalic and atraumatic.  Eyes:  Conjunctivae and EOM are normal. Pupils are equal, round, and reactive to light. Right eye exhibits no nystagmus. Left eye exhibits no nystagmus.  Neck: Neck supple. No thyromegaly present.  Cardiovascular: Normal rate, regular rhythm and normal heart sounds.  Exam reveals no gallop and no friction rub.   No murmur heard. Pulmonary/Chest: Effort normal and breath sounds normal. No respiratory distress. He has no wheezes. He has no rales.  Musculoskeletal: He exhibits no edema.  Neurological: He is alert.  Skin: Skin is warm and dry.  Psychiatric: He has a normal mood and affect. His behavior is normal.  Nursing note and vitals reviewed.   Vitals:   01/06/17 1743  BP: 121/76  Pulse: 67  Temp: 98.2 F (36.8 C)  TempSrc: Oral  SpO2: 96%  Weight: 270 lb 12.8 oz (122.8 kg)  Height: 6\' 2"  (1.88 m)   Body mass index is 34.77 kg/m. Assessment & Plan:  HELDER Mckee is a 61 y.o. male Low testosterone  - Markedly low, and at that level may need further evaluation for secondary hypergonadism. Previously TSH was normal, will refer to endocrinology to decide if brain MRI needed or other further hormonal evaluation.  OSA on CPAP  -It appears he needs a titration study for new equipment, advised to follow-up with his sleep specialist.  Low vitamin D level  -Taking high-dose vitamin D once per week, plan to recheck levels with repeat lipid testing in next 4-5 weeks.  Hyperlipidemia, unspecified hyperlipidemia type  -Watch diet, plan to repeat test fasting at visit in 4-5 weeks.  No orders of the defined types were placed in this encounter.  Patient Instructions   For low testosterone, I will refer you to an endocrinologist to look into whether you need other testing such as an MRI with the low reading.   For sleep apnea, it appears they wanted to schedule you for a CPAP titration. Call their office tomorrow to determine the next step.    Plan on follow up in next 4-5 weeks for fasting  labs to recheck cholesterol and vitamin D.    IF you received an x-ray today, you will receive an invoice from [facility]. Please contact [facility] at [phone] with questions or concerns regarding your invoice.   IF you received labwork today, you will receive an invoice from [facility]. Please contact [facility] at [phone] with questions or concerns regarding your invoice.   Our billing staff will not be able to assist you with questions regarding bills from these companies.  You will be contacted with the lab results as soon as they are available. The fastest way to get your results is to activate your My Chart account. Instructions are located on the last page of this paperwork. If you have not heard from [place]  regarding the results in 2 weeks, please contact this office.       I personally performed the services described in this documentation, which was scribed in my presence. The recorded information has been reviewed and considered for accuracy and completeness, addended by me as needed, and agree with information above.  Signed,   Meredith StaggersJeffrey Lynesha Bango, MD [facility] at [facility].  01/08/17 8:47 PM

## 2017-01-08 ENCOUNTER — Encounter: Payer: Self-pay | Admitting: Family Medicine

## 2017-01-08 DIAGNOSIS — R7989 Other specified abnormal findings of blood chemistry: Secondary | ICD-10-CM

## 2017-01-10 NOTE — Addendum Note (Signed)
Addended by: Morrell RiddleWEBER, SARAH L on: 01/10/2017 04:35 PM   Modules accepted: Orders

## 2017-01-21 MED FILL — TAMSULOSIN HCL 0.4 MG CAP: 0.4 | 90 days supply | Qty: 180 | Fill #0

## 2017-02-02 NOTE — Progress Notes (Signed)
Patient ID: Dustin Mckee, male   DOB: 10-03-1956, 61 y.o.   MRN: [id number]            Referring physician: Karyl Kinnier  Reason for consultation: low testosterone   Chief complaint: fatigue  History of Present Illness  Hypogonadismwas diagnosed in 2012 or so by his previous primary care physician when he was having complaints of fatigue and lethargy He does not know the details about his evaluation and no records are available However he was treated with a testosterone gel for several months with improvement in his symptoms Not clear why he stopped taking the treatment  He again has had had complaints offatigue,  Lethargy,decreased motivation, decreased libido  Since late last year   There is no history of the following: Hot flushes, sweats, breast enlargement, long term anabolic steroid use, history of testicular injury mumps in childhood. No history of osteopenia or low impact fracture  He had testosterone level checked by his PCP late morning and since it was low he is referred here now for further evaluation  Prior lab results showtestosterone levels of:  Lab Results  Component Value Date   TESTOSTERONE 87 (L) 12/30/2016    free testosterone level was decreased at 19, normal  >35.8  Prolactin level: unknown  No results found for: LH        Allergies as of 02/03/2017      Reactions   Iodine Hives      Medication List       Accurate as of 02/03/17  4:16 PM. Always use your most recent med list.          cyclobenzaprine 5 MG tablet Commonly known as:  FLEXERIL 1 pill by mouth up to every 8 hours as needed. Start with one pill by mouth each bedtime as needed due to sedation   hydrochlorothiazide 12.5 MG capsule Commonly known as:  MICROZIDE Take 1 capsule (12.5 mg total) by mouth daily.   predniSONE 20 MG tablet Commonly known as:  DELTASONE 3 by mouth for 3 days, then 2 by mouth for 2 days, then 1 by mouth for 2 days, then 1/2 by  mouth for 2 days.   rosuvastatin 10 MG tablet Commonly known as:  CRESTOR   tamsulosin 0.4 MG Caps capsule Commonly known as:  FLOMAX Take 2 capsules (0.8 mg total) by mouth daily.   Vitamin D (Ergocalciferol) 50000 units Caps capsule Commonly known as:  DRISDOL Take 1 capsule (50,000 Units total) by mouth every 7 (seven) days.       Allergies:  Allergies  Allergen Reactions  . Iodine Hives    Past Medical History:  Diagnosis Date  . Allergy   . Hyperlipidemia     Past Surgical History:  Procedure Laterality Date  . APPENDECTOMY    . TOOTH EXTRACTION      Family History  Problem Relation Age of Onset  . Heart disease Mother   . Kidney disease Mother   . Heart disease Father     defibrillator/CHF.  . Diabetes Father   . Heart disease Sister     CHF  . Diabetes Sister   . Heart disease Sister     CHF    Social History:  reports that he has never smoked. He has never used smokeless tobacco. He reports that he does not drink alcohol or use drugs.  Review of Systems  Constitutional: Positive for weight loss.  HENT: Negative for headaches.   Eyes: Negative for blurred  vision.  Respiratory: Positive for daytime sleepiness.        Mild sleepiness, has been on CPAP for several years for sleep apnea with usually good control  Gastrointestinal: Negative for constipation.  Endocrine: Positive for decreased libido. Negative for cold intolerance.  Genitourinary: Positive for slow stream. Negative for nocturia.  Musculoskeletal: Negative for joint pain.  Neurological:       He does not feel as strong as before, no specific weakness of any extremity  Psychiatric/Behavioral: Negative for insomnia.   His maximum weight has been about 290, his weight was recorded as 284 in 04/2016 He has lost weight in the last few months with starting to watch diet, not able to exercise much because of fatigue   Wt Readings from Last 3 Encounters:  02/03/17 265 lb (120.2 kg)    01/06/17 270 lb 12.8 oz (122.8 kg)  11/08/16 269 lb 12.8 oz (122.4 kg)    General Examination:   BP 128/78   Pulse 73   Ht  (1.88 m)   Wt 265 lb (120.2 kg)   SpO2 97%   BMI 34.02 kg/m   GENERAL APPEARANCE generalized obesity Present.  SKIN:normal, no rash or pigmentation.  HEENT:Oral mucosa normal.  EYES:normal external appearance of eyes, Fundii not well visualized.   NECK:no lymphadenopathy, no thyromegaly.  CHEST: Gynecomastia minimally present on the left LUNGS:clear to auscultation bilaterally  HEART:normal S1 And S2, no S3, S4, murmur or click.  ABDOMEN:no hepatosplenomegaly, no masses palpated, soft and not tender.   MALE GENITOURINARY:left testicle about 3 cm and right testicle 3-3.5 cm, both relatively soft  MUSCULOSKELETALNo enlargement or deformity of joints.  EXTREMITIES:no clubbing, no edema.   NEUROLOGIC EXAM: Biceps/ankles reflexes normal (2+) bilaterally.   Assessment/ Plan:  Hypogonadism, symptomatic with decreased free testosterone level Although it appeared the patient had similar problems several years ago symptoms had resolved after short-term course of testosterone supplementation He may have developed recurrence of his hypogonadism now because of his significant weight gain over the last couple of years and presence of insulin resistance syndrome  However he does need further evaluation of his pituitary function and also to check LH level to identify whether this is primary or secondary hypogonadism Prolactin level will be checked Free T4 will be checked to rule out secondary hypothyroidism If he has any abnormality of free T4 prolactin will need imaging of the pituitary gland  Discussed various options for testosterone supplementation including transdermal gel and clomiphene.   Discussed pros and cons of various treatments and efficacy of his and clomiphene which may be  simpler to use He may or may not need long-term treatment if he has insulin resistance syndrome and is able to lose weight  He also has non-BPH and will need to monitor him for change in prostatic symptoms as well as PSA when he is started on treatment   Report of consultation sent to PCP   [facility] 02/03/2017, 4:16 PM

## 2017-02-03 ENCOUNTER — Ambulatory Visit (INDEPENDENT_AMBULATORY_CARE_PROVIDER_SITE_OTHER): Payer: 59 | Admitting: Endocrinology

## 2017-02-03 ENCOUNTER — Encounter: Payer: Self-pay | Admitting: Endocrinology

## 2017-02-03 VITALS — BP 128/78 | HR 73 | Ht 74.0 in | Wt 265.0 lb

## 2017-02-03 DIAGNOSIS — E291 Testicular hypofunction: Secondary | ICD-10-CM | POA: Diagnosis not present

## 2017-02-03 DIAGNOSIS — R5383 Other fatigue: Secondary | ICD-10-CM

## 2017-02-03 DIAGNOSIS — R7301 Impaired fasting glucose: Secondary | ICD-10-CM | POA: Diagnosis not present

## 2017-02-03 DIAGNOSIS — R7989 Other specified abnormal findings of blood chemistry: Secondary | ICD-10-CM

## 2017-02-04 ENCOUNTER — Other Ambulatory Visit (INDEPENDENT_AMBULATORY_CARE_PROVIDER_SITE_OTHER): Payer: 59

## 2017-02-04 DIAGNOSIS — E291 Testicular hypofunction: Secondary | ICD-10-CM

## 2017-02-04 DIAGNOSIS — R5383 Other fatigue: Secondary | ICD-10-CM

## 2017-02-04 DIAGNOSIS — R7301 Impaired fasting glucose: Secondary | ICD-10-CM | POA: Diagnosis not present

## 2017-02-04 DIAGNOSIS — R7989 Other specified abnormal findings of blood chemistry: Secondary | ICD-10-CM

## 2017-02-04 LAB — LUTEINIZING HORMONE: LH: 4.39 m[IU]/mL (ref 1.50–9.30)

## 2017-02-04 LAB — GLUCOSE, RANDOM: GLUCOSE: 105 mg/dL — AB (ref 70–99)

## 2017-02-04 LAB — HEMOGLOBIN A1C: HEMOGLOBIN A1C: 5.8 % (ref 4.6–6.5)

## 2017-02-04 LAB — TESTOSTERONE: TESTOSTERONE: 190.05 ng/dL — AB (ref 300.00–890.00)

## 2017-02-04 LAB — T4, FREE: FREE T4: 0.75 ng/dL (ref 0.60–1.60)

## 2017-02-05 LAB — PROLACTIN: Prolactin: 15.4 ng/mL — ABNORMAL HIGH (ref 4.0–15.2)

## 2017-02-05 MED FILL — LISINOPRIL-HCTZ 20-12.5 MG: 20-12.5 | 90 days supply | Qty: 90 | Fill #2

## 2017-02-10 ENCOUNTER — Telehealth: Payer: Self-pay | Admitting: Endocrinology

## 2017-02-10 DIAGNOSIS — E230 Hypopituitarism: Secondary | ICD-10-CM

## 2017-02-10 MED ORDER — CLOMIPHENE CITRATE 50 MG PO TABS: ORAL_TABLET | ORAL | 1 refills | Status: DC

## 2017-02-10 MED FILL — CLOMIPHENE CITRATE 50 MG TA: 50 | 42 days supply | Qty: 10 | Fill #0

## 2017-02-10 NOTE — Telephone Encounter (Signed)
Please let him know that testosterone level is low from sluggish pituitary gland and have sent a prescription for CLOMIPHENE 50 mg, half tablet 3 times a week.  This may or may not be covered by insurance but should not be expensive Need to schedule fasting labs a couple of days before he is coming in to see me

## 2017-02-11 NOTE — Telephone Encounter (Signed)
Left detailed message and requested a call back if he had any questions 

## 2017-02-20 ENCOUNTER — Telehealth: Payer: Self-pay | Admitting: Endocrinology

## 2017-02-20 NOTE — Telephone Encounter (Signed)
-----   Message from Reather Littler, MD sent at 02/19/2017  7:55 AM EDT ----- Regarding: Appointment needed He needs to have a fasting lab appointment before his office visit, thanks

## 2017-02-20 NOTE — Telephone Encounter (Signed)
LM for pt to call back to schedule °

## 2017-02-26 ENCOUNTER — Telehealth: Payer: Self-pay | Admitting: Endocrinology

## 2017-02-26 NOTE — Telephone Encounter (Signed)
-----   Message from Reather Littler, MD sent at 02/19/2017  7:55 AM EDT ----- Regarding: Appointment needed He needs to have a fasting lab appointment before his office visit, thanks

## 2017-02-26 NOTE — Telephone Encounter (Signed)
LM for pt to call back to schedule the Lab appt

## 2017-03-04 NOTE — Telephone Encounter (Signed)
LM for pt to call back to schedule labs.

## 2017-03-12 ENCOUNTER — Other Ambulatory Visit: Payer: 59

## 2017-03-21 ENCOUNTER — Ambulatory Visit (INDEPENDENT_AMBULATORY_CARE_PROVIDER_SITE_OTHER): Payer: 59 | Admitting: Endocrinology

## 2017-03-21 ENCOUNTER — Encounter: Payer: Self-pay | Admitting: Endocrinology

## 2017-03-21 VITALS — BP 140/88 | HR 69 | Ht 74.0 in | Wt 258.8 lb

## 2017-03-21 DIAGNOSIS — E230 Hypopituitarism: Secondary | ICD-10-CM | POA: Diagnosis not present

## 2017-03-21 LAB — LUTEINIZING HORMONE: LH: 3.57 m[IU]/mL (ref 1.50–9.30)

## 2017-03-21 LAB — TESTOSTERONE: Testosterone: 307.31 ng/dL (ref 300.00–890.00)

## 2017-03-21 MED FILL — VIT D2 1.25 MG (50,000 UNIT: 1.25 MG | 84 days supply | Qty: 12 | Fill #1

## 2017-03-21 MED FILL — ROSUVASTATIN CALCIUM 20 MG: 20 | 90 days supply | Qty: 90 | Fill #1

## 2017-03-21 MED FILL — CLOMIPHENE CITRATE 50 MG TA: 50 | 42 days supply | Qty: 10 | Fill #1

## 2017-03-21 NOTE — Progress Notes (Signed)
Patient ID: Dustin Mckee, male   DOB: [birth year], 61 y.o.   MRN: [id number]            Referring physician: Karyl Kinnier  Reason for consultation: low testosterone   Chief complaint: fatigue  History of Present Illness  Hypogonadismwas diagnosed in 2012by his previous primary care physician when he was having complaints of fatigue and lethargy He does not know the details about his evaluation and no records are available However he was treated with a testosterone gel for several months with improvement in his symptoms Not clear why he stopped taking the treatment  He has had had complaints offatigue,  Lethargy,decreased motivation, decreased libido  Since late last year He had testosterone levels levels that were significantly low  With his evaluation 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, prolactin upper normal He has been on CLOMIPHENE half tablet 3 times a week since early April With this he feels improved with his energy level but not to normal, and his libido is improving  Prior lab results showtestosterone levels of:  Lab Results  Component Value Date   TESTOSTERONE 190.05 (L) 02/04/2017   TESTOSTERONE 87 (L) 12/30/2016    free testosterone level was decreased at 19, normal  >35.8  Prolactin level: 15.4  Lab Results  Component Value Date   LH 4.39 02/04/2017          Allergies as of 03/21/2017      Reactions   Iodine Hives      Medication List       Accurate as of 03/21/17  9:43 AM. Always use your most recent med list.          clomiPHENE 50 MG tablet Commonly known as:  CLOMID Half tablet 3 times a week   cyclobenzaprine 5 MG tablet Commonly known as:  FLEXERIL 1 pill by mouth up to every 8 hours as needed. Start with one pill by mouth each bedtime as needed due to sedation   hydrochlorothiazide 12.5 MG capsule Commonly known as:  MICROZIDE Take 1 capsule (12.5 mg total)  by mouth daily.   rosuvastatin 10 MG tablet Commonly known as:  CRESTOR   tamsulosin 0.4 MG Caps capsule Commonly known as:  FLOMAX Take 2 capsules (0.8 mg total) by mouth daily.   Vitamin D (Ergocalciferol) 50000 units Caps capsule Commonly known as:  DRISDOL Take 1 capsule (50,000 Units total) by mouth every 7 (seven) days.       Allergies:  Allergies  Allergen Reactions  . Iodine Hives    Past Medical History:  Diagnosis Date  . Allergy   . Hyperlipidemia   . Hypertension   . Sleep apnea     Past Surgical History:  Procedure Laterality Date  . APPENDECTOMY    . TOOTH EXTRACTION      Family History  Problem Relation Age of Onset  . Heart disease Mother   . Kidney disease Mother   . Heart disease Father        defibrillator/CHF.  . Diabetes Father   . Heart disease Sister        CHF  . Diabetes Sister   . Heart disease Sister        CHF    Social History:  reports that he has never smoked. He has never used smokeless tobacco. He reports that he does not drink alcohol or use drugs.  Review of Systems  Respiratory:  His daytime somnolence is a little better   He has prediabetes with fasting glucose 105 and A1c 5.8  His maximum weight has been about 290, his weight was recorded as 284 in 04/2016 He has lost weight in the last few months with starting to watch diet, not able to exercise much because of fatigue   Wt Readings from Last 3 Encounters:  03/21/17 258 lb 12.8 oz (117.4 kg)  02/03/17 265 lb (120.2 kg)  01/06/17 270 lb 12.8 oz (122.8 kg)    General Examination:   BP 140/88   Pulse 69   Ht 6\' 2"  (1.88 m)   Wt 258 lb 12.8 oz (117.4 kg)   SpO2 96%   BMI 33.23 kg/m    Assessment/ Plan:  Hypogonadotropic Hypogonadism, symptomatic with decreased free testosterone level Although He does not feel back to normal with clomiphene as yet he has only been taking this for about 3-4 weeks Symptomatically is improving somewhat  We do need to  check his labs again to see his testosterone is improving and his LH is responding Currently he is able to lose weight with changing his diet and exercising and appears well motivated  Will call him back after his labs are available and decide on dosage adjustment and follow-up   Tyrina Hines 03/21/2017, 9:43 AM

## 2017-03-23 NOTE — Progress Notes (Signed)
Please call to let patient know that the testosterone level is significantly better, would like to continue same dose for now, follow-up as scheduled

## 2017-04-21 MED FILL — TAMSULOSIN HCL 0.4 MG CAP: 0.4 | 90 days supply | Qty: 180 | Fill #1

## 2017-05-21 MED FILL — LISINOPRIL-HCTZ 20-12.5 MG: 20-12.5 | 90 days supply | Qty: 90 | Fill #0

## 2017-06-12 DIAGNOSIS — Z8249 Family history of ischemic heart disease and other diseases of the circulatory system: Secondary | ICD-10-CM | POA: Diagnosis not present

## 2017-06-12 DIAGNOSIS — I10 Essential (primary) hypertension: Secondary | ICD-10-CM | POA: Diagnosis not present

## 2017-06-12 DIAGNOSIS — E782 Mixed hyperlipidemia: Secondary | ICD-10-CM | POA: Diagnosis not present

## 2017-06-12 DIAGNOSIS — Z6831 Body mass index (BMI) 31.0-31.9, adult: Secondary | ICD-10-CM | POA: Diagnosis not present

## 2017-06-13 MED FILL — VIT D2 1.25 MG (50,000 UNIT: 1.25 MG | 41 days supply | Qty: 6 | Fill #2

## 2017-06-19 ENCOUNTER — Other Ambulatory Visit (INDEPENDENT_AMBULATORY_CARE_PROVIDER_SITE_OTHER): Payer: 59

## 2017-06-19 DIAGNOSIS — E230 Hypopituitarism: Secondary | ICD-10-CM

## 2017-06-19 LAB — LUTEINIZING HORMONE: LH: 3.53 m[IU]/mL (ref 1.50–9.30)

## 2017-06-19 LAB — TESTOSTERONE: Testosterone: 194.14 ng/dL — ABNORMAL LOW (ref 300.00–890.00)

## 2017-06-20 LAB — PROLACTIN: Prolactin: 7 ng/mL (ref 2.0–18.0)

## 2017-06-22 NOTE — Progress Notes (Signed)
Patient ID: Dustin BasquesGregory A Stehlik, male   DOB: 03/04/1956, 61 y.o.   MRN: [id number]            Referring physician: [name]  Reason for consultation: low testosterone   Chief complaint: fatigue  History of Present Illness  Hypogonadismwas diagnosed in 2012 by his previous primary care physician when he was having complaints of fatigue and lethargy He does not know the details about his evaluation and no records are available However he was treated with a testosterone gel for several months with improvement in his symptoms Not clear why he stopped taking the treatment  He has had had complaints offatigue,  Lethargy, decreased motivation, decreased libido  Since late last year He had testosterone levels levels that were significantly low  With his evaluation 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, prolactin upper normal He has been on CLOMIPHENE half tablet 3 times a week since early April 2017  RECENT history: On his follow-up in 5/18 he had reported improved with his energy level but not to normal, and his libido was improving Since his testosterone level, significantly to the low normal levels compared to very low levels previously he was continued on the same dose He thinks he was almost back to normal subjectively after that visit  However for the last 3 weeks he has not taken any clomiphene as he ran out and did not refill his prescription He is again feeling fatigued and having decreased libido Testosterone level is back to below 200  Prior lab results showtestosterone levels of:  Lab Results  Component Value Date   TESTOSTERONE 194.14 (L) 06/19/2017   TESTOSTERONE 307.31 03/21/2017   TESTOSTERONE 190.05 (L) 02/04/2017   TESTOSTERONE 87 (L) 12/30/2016    free testosterone level was decreased at 19, normal  >35.8  Prolactin level: 15.4  Lab Results  Component Value Date   LH 3.53 06/19/2017           Allergies as of 06/23/2017      Reactions   Iodine Hives      Medication List       Accurate as of 06/23/17  8:23 AM. Always use your most recent med list.          clomiPHENE 50 MG tablet Commonly known as:  CLOMID Half tablet 3 times a week   hydrochlorothiazide 12.5 MG capsule Commonly known as:  MICROZIDE Take 1 capsule (12.5 mg total) by mouth daily.   rosuvastatin 10 MG tablet Commonly known as:  CRESTOR   tamsulosin 0.4 MG Caps capsule Commonly known as:  FLOMAX Take 2 capsules (0.8 mg total) by mouth daily.   Vitamin D (Ergocalciferol) 50000 units Caps capsule Commonly known as:  DRISDOL Take 1 capsule (50,000 Units total) by mouth every 7 (seven) days.       Allergies:  Allergies  Allergen Reactions  . Iodine Hives    Past Medical History:  Diagnosis Date  . Allergy   . Hyperlipidemia   . Hypertension   . Sleep apnea     Past Surgical History:  Procedure Laterality Date  . APPENDECTOMY    . TOOTH EXTRACTION      Family History  Problem Relation Age of Onset  . Heart disease Mother   . Kidney disease Mother   . Heart disease Father        defibrillator/CHF.  . Diabetes Father   . Heart disease Sister  CHF  . Diabetes Sister   . Heart disease Sister        CHF    Social History:  reports that he has never smoked. He has never used smokeless tobacco. He reports that he does not drink alcohol or use drugs.  Review of Systems   He has prediabetes with fasting glucose 105 and A1c  Previously 5.8  This has been periodically checked by PCP His maximum weight has been about 290, his weight was recorded as 284 in 04/2016  Again recently has not exercised because of fatigue   Wt Readings from Last 3 Encounters:  06/23/17 256 lb 9.6 oz (116.4 kg)  03/21/17 258 lb 12.8 oz (117.4 kg)  02/03/17 265 lb (120.2 kg)    General Examination:   BP 136/84   Pulse 64   Ht 6\' 2"  (1.88 m)   Wt 256 lb 9.6 oz (116.4 kg)   SpO2  95%   BMI 32.95 kg/m    Assessment/ Plan:  Hypogonadotropic Hypogonadism, symptomatic with decreased free testosterone level  He had done subjectively much better with aching clomiphene regularly and his testosterone level had improved significantly However without his taking the clomiphene recently his level is back to below 200 He still likes to continue clomiphene compared to AndroGel  Discussed need to continue clomiphene on a regular basis as long as he can achieve symptomatic improvement and good testosterone levels He will go back to taking half tablet 3 times a week as before and have another testosterone level checked in 6 weeks   Impaired fasting glucose: Discussed prediabetes and need for restarting exercise and trying to work on his weight loss again Will recheck his glucose on the next visit along with A1c  Total visit time 15 minutes  Zeena Starkel 06/23/2017, 8:23 AM

## 2017-06-23 ENCOUNTER — Encounter: Payer: Self-pay | Admitting: Endocrinology

## 2017-06-23 ENCOUNTER — Ambulatory Visit (INDEPENDENT_AMBULATORY_CARE_PROVIDER_SITE_OTHER): Payer: 59 | Admitting: Endocrinology

## 2017-06-23 VITALS — BP 136/84 | HR 64 | Ht 74.0 in | Wt 256.6 lb

## 2017-06-23 DIAGNOSIS — E230 Hypopituitarism: Secondary | ICD-10-CM

## 2017-06-23 DIAGNOSIS — R7301 Impaired fasting glucose: Secondary | ICD-10-CM | POA: Diagnosis not present

## 2017-06-23 MED ORDER — CLOMIPHENE CITRATE 50 MG PO TABS: ORAL_TABLET | ORAL | 5 refills | Status: DC

## 2017-06-23 MED FILL — CLOMIPHENE CITRATE 50 MG TA: 50 | 42 days supply | Qty: 10 | Fill #0

## 2017-06-23 NOTE — Patient Instructions (Addendum)
Restart [facility]omiphene at [employer]

## 2017-07-07 ENCOUNTER — Telehealth: Payer: Self-pay | Admitting: Neurology

## 2017-07-07 NOTE — Telephone Encounter (Signed)
Message   Appointment Request From: Giovan Aronoff. Christell Constant    With Provider: Melvyn Novas, MD [[facility]]    Preferred Date Range: From 07/21/2017 To 07/28/2017    Preferred Times: Any    Reason for visit: Office Visit    Comments:  I would like to schedule a sleep study as soon as possible. My CPAD is extremely old and starting to malfunction.

## 2017-07-07 NOTE — Telephone Encounter (Signed)
Called and left message with patient that he will need to schedule for an appointment with Dr. Golden Hurter to discuss sleep study order.  I don't have an order for sleep study so I can't schedule one.

## 2017-07-10 MED FILL — ROSUVASTATIN CALCIUM 20 MG: 20 | 90 days supply | Qty: 90 | Fill #2

## 2017-07-21 ENCOUNTER — Other Ambulatory Visit: Payer: Self-pay | Admitting: Family Medicine

## 2017-07-21 MED FILL — TAMSULOSIN HCL 0.4 MG CAP: 0.4 | 90 days supply | Qty: 180 | Fill #2

## 2017-08-04 ENCOUNTER — Other Ambulatory Visit (INDEPENDENT_AMBULATORY_CARE_PROVIDER_SITE_OTHER): Payer: 59

## 2017-08-04 DIAGNOSIS — R7301 Impaired fasting glucose: Secondary | ICD-10-CM

## 2017-08-04 DIAGNOSIS — E230 Hypopituitarism: Secondary | ICD-10-CM

## 2017-08-04 LAB — TESTOSTERONE: TESTOSTERONE: 325.92 ng/dL (ref 300.00–890.00)

## 2017-08-04 LAB — GLUCOSE, RANDOM: Glucose, Bld: 100 mg/dL — ABNORMAL HIGH (ref 70–99)

## 2017-08-04 LAB — HEMOGLOBIN A1C: HEMOGLOBIN A1C: 5.8 % (ref 4.6–6.5)

## 2017-08-04 LAB — LUTEINIZING HORMONE: LH: 3.1 m[IU]/mL (ref 1.50–9.30)

## 2017-08-05 MED FILL — CLOMIPHENE CITRATE 50 MG TA: 50 | 42 days supply | Qty: 10 | Fill #1

## 2017-08-21 MED FILL — LISINOPRIL-HCTZ 20-12.5 MG: 20-12.5 | 90 days supply | Qty: 90 | Fill #1

## 2017-09-03 ENCOUNTER — Encounter: Payer: Self-pay | Admitting: Neurology

## 2017-09-03 ENCOUNTER — Ambulatory Visit (INDEPENDENT_AMBULATORY_CARE_PROVIDER_SITE_OTHER): Payer: 59 | Admitting: Neurology

## 2017-09-03 VITALS — BP 121/71 | HR 86 | Ht 75.0 in | Wt 257.0 lb

## 2017-09-03 DIAGNOSIS — J321 Chronic frontal sinusitis: Secondary | ICD-10-CM

## 2017-09-03 DIAGNOSIS — J301 Allergic rhinitis due to pollen: Secondary | ICD-10-CM | POA: Diagnosis not present

## 2017-09-03 DIAGNOSIS — G4719 Other hypersomnia: Secondary | ICD-10-CM

## 2017-09-03 DIAGNOSIS — G4733 Obstructive sleep apnea (adult) (pediatric): Secondary | ICD-10-CM | POA: Diagnosis not present

## 2017-09-03 DIAGNOSIS — Z9989 Dependence on other enabling machines and devices: Secondary | ICD-10-CM

## 2017-09-03 NOTE — Progress Notes (Signed)
SLEEP MEDICINE CLINIC   Provider:  Melvyn Novasarmen  Kallie Depolo, M D  Referring Provider: Doristine BosworthStallings, Zoe A, MD Primary Care Physician:  Doristine BosworthStallings, Zoe A, MD  Chief Complaint  Patient presents with  . Follow-up    pt alone, rm 10. pt was seen in 2016 and never had cpap titration test done. here today to see about getting that completed so that he can get a new cpap machine.     HPI:   The pleasure of seeing Dustin Mckee today, and meanwhile 61 year old African-American gentleman whom I last saw in 2016. Today's 09/03/2017 and the patient had not followed up on his previous sleep study performed on 10/26/2015 at [facility]. The patient's AHI of a home sleep test was 28.6 and his oxygen nadir at 82%, he definitely qualified for a new CPAP machine but he did not return for a CPAP titration. The patient is [facility]. I will invite him for a CPAP titration ASAP.       Dustin Mckee is a 61 y.o. male , seen here as a referral from Dr. Nichola SizerBobby Doolittle, MD  / Deliah BostonMichael Clark, PA at [facility] for a sleep consultation.  Chief complaint according to patient :" I don't have the same benefit by using CPAP".  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  [name] reports that in the year 2004 he was [facility] with [facility] and a CPAP machine was prescribed. About 6 years later around 2010 there was an adjustment made to the pressures. Since then and in between he did not require any changes and he has used his CPAP faithfully ever since. Since 2010 he has gained weight and that may be a factor in his resurgence of sleepiness. He feels more fatigued unable to multitask or focus as well. A download was obtained today in office showing that his CPAP has been used on average 6 hours and 47  minutes per day and that he has is to 30 out of 30 days. His compliance is 96% which is excellent. Unfortunately he was prescribed an escape model which does not allow access to therapeutic data such as residual AHI. He is using and nasal pillow model as interface.  Sleep habits are as follows: Patient works daytime job, his usual bedtime is between 10 and 11:00 PM, he falls asleep promptly with his CPAP. He used to have nocturia up to 3 or 4 times at night and to before he started on Flomax now he will go to the bathroom perhaps once. He has noted that he doesn't seem to dream as much anymore. He sleeps otherwise well through the night , on his left side, until he rises in the morning at 6 AM. He wakes up spontaneously without alarm. The bedroom is cool, quiet and dark. He shares the bedroom with his spouse. He will take a couple of coffee in the morning and commute to work. His workplace does not have access to natural daylight. He has found himself during the day sometimes for a couple of seconds to microsleep. He does not schedule any naps. After work around 6 PM he often feels that he is tired enough to take a nap and sometimes he will allow himself to nap for 30 minutes, but this is rare.  How likely  are you to doze in the following situations: 0 = not likely, 1 = slight chance, 2 = moderate chance, 3 = high chance  Sitting and Reading?2 Watching Television?2 Sitting inactive in a public place (theater or meeting)?2 Lying down in the afternoon when circumstances permit?1 Sitting and talking to someone?1 Sitting quietly after lunch without alcohol?1 In a car, while stopped for a few minutes in traffic?2 As a passenger in a car for an hour without a break? 1  Total =12   Sleep medical history and family sleep history:  The patient has 3 brothers and his youngest brother was diagnosed with sleep apnea. Both parents were snorers.  The patient slept walked in childhood after elementary  school-age this ceased. Oldest son is a sleep walker.   Social history:  Married, 3 children , no ETOH, non smoker. coffee 1-2 cups per day.   Review of Systems: Out of a complete 14 system review, the patient complains of only the following symptoms, and all other reviewed systems are negative.   Epworth score 5, Fatigue severity score n/a  , depression score n/a   Social History   Social History  . Marital status: Married    Spouse name: [name]  . Number of children: N/A  . Years of education: N/A   Occupational History  . Not on file.   Social History Main Topics  . Smoking status: Never Smoker  . Smokeless tobacco: Never Used  . Alcohol use No  . Drug use: No  . Sexual activity: Yes    Birth control/ protection: None   Other Topics Concern  . Not on file   Social History Narrative   Drinks 1-2 cups caffeine daily.    Family History  Problem Relation Age of Onset  . Heart disease Mother   . Kidney disease Mother   . Heart disease Father        defibrillator/CHF.  . Diabetes Father   . Heart disease Sister        CHF  . Diabetes Sister   . Heart disease Sister        CHF    Past Medical History:  Diagnosis Date  . Allergy   . Hyperlipidemia   . Hypertension   . Sleep apnea     Past Surgical History:  Procedure Laterality Date  . APPENDECTOMY    . TOOTH EXTRACTION      Current Outpatient Prescriptions  Medication Sig Dispense Refill  . clomiPHENE (CLOMID) 50 MG tablet Half tablet 3 times a week 10 tablet 5  . hydrochlorothiazide (MICROZIDE) 12.5 MG capsule Take 1 capsule (12.5 mg total) by mouth daily. 90 capsule 3  . rosuvastatin (CRESTOR) 10 MG tablet   2  . tamsulosin (FLOMAX) 0.4 MG CAPS capsule Take 2 capsules (0.8 mg total) by mouth daily. 180 capsule 3   No current facility-administered medications for this visit.     Allergies as of 09/03/2017 - Review Complete 09/03/2017  Allergen Reaction Noted  . Iodine Hives     Vitals: BP  121/71   Pulse 86   Ht 6\' 3"  (1.905 m)   Wt 257 lb (116.6 kg)   BMI 32.12 kg/m  Last Weight:  Wt Readings from Last 1 Encounters:  09/03/17 257 lb (116.6 kg)   JYN:WGNF mass index is 32.12 kg/m.     Last Height:   Ht Readings from Last 1 Encounters:  09/03/17 6\' 3"  (1.905 m)    Physical exam:  General: The  patient is awake, alert and appears not in acute distress. The patient is well groomed. Head: Normocephalic, atraumatic. Neck is supple. Mallampati 3  , tonsillary atrophy  neck circumference:18.5. Nasal airflow restricited , TMJ not evident . Retrognathia not seen.  Cardiovascular:  Regular rate and rhythm, without  murmurs or carotid bruit, and without distended neck veins. Respiratory: Lungs are clear to auscultation. Skin:  Without evidence of edema, or rash Trunk: BMI is elevated. The patient's posture is erect   Neurologic exam : The patient is awake and alert, oriented to place and time.   Memory subjective described as intact.  Attention span & concentration ability appears normal.  Speech is fluent,  without  dysarthria, dysphonia or aphasia.  Mood and affect are appropriate.  Cranial nerves: Pupils are equal and briskly reactive to light. Funduscopic exam without evidence of pallor or edema.  Extraocular movements  in vertical and horizontal planes intact and without nystagmus. Visual fields by finger perimetry are intact. Hearing to finger rub intact.   Facial sensation intact to fine touch.  Facial motor strength is symmetric and tongue and uvula move midline. Shoulder shrug was symmetrical.   Motor exam:  Normal tone, muscle bulk and symmetric strength in all extremities. Sensory:  Fine touch, pinprick and vibration were tested in all extremities.  Proprioception tested in the upper extremities was normal. Coordination: Rapid alternating movements in the fingers/hands was normal.  Finger-to-nose maneuver without evidence of ataxia, dysmetria or  tremor.  Gait and station: Patient walks without assistive device and is able unassisted to climb up to the exam table. Strength within normal limits.  Stance is stable and normal.  Toe and heel stand were tested . Turns with 3  Steps.  Deep tendon reflexes: in the  upper and lower extremities are symmetric and intact.   The patient was advised of the nature of the diagnosed sleep disorder , the treatment options and risks for general a health and wellness arising from not treating the condition.  I spent more than 25 minutes of time with the patient. Greater than 50% of time was spent in  Face to face counseling and coordination of care. We have discussed the diagnosis and differential and I answered the patient's questions.    Mr. Gurry is still using a CPAP as well over a decade old, and he definitely qualifies for new machine. His home sleep test from December 2 years ago showed a moderate severe degree of obstructive sleep apnea and hypoxemia. CPAP is the therapy of choice for him in the meantime he has also developed hypersomnia that wasn't present when I saw him in December 2016. I will arrange for an inpatient CPAP titration, he indicated that he has a phone number that it could be easier reached under which is [phone].   Assessment:  After physical and neurologic examination, review of laboratory studies,  Personal review of imaging studies, reports of other /same  Imaging studies ,  Results of  neurophysiology testing and pre-existing records as far as provided in visit., my assessment is      Plan:  Treatment plan and additional workup :  Resume allegra , CPAP titration.  Porfirio Mylar Jahziel Sinn MD  09/03/2017   CC: Dr. Ellamae Sia , [facility]

## 2017-09-03 NOTE — Patient Instructions (Signed)

## 2017-09-08 ENCOUNTER — Telehealth: Payer: Self-pay | Admitting: Family Medicine

## 2017-09-08 ENCOUNTER — Encounter: Payer: Self-pay | Admitting: Family Medicine

## 2017-09-08 ENCOUNTER — Ambulatory Visit (INDEPENDENT_AMBULATORY_CARE_PROVIDER_SITE_OTHER): Payer: 59 | Admitting: Family Medicine

## 2017-09-08 VITALS — BP 112/60 | HR 81 | Temp 98.7°F | Resp 18 | Ht 73.23 in | Wt 254.6 lb

## 2017-09-08 DIAGNOSIS — I10 Essential (primary) hypertension: Secondary | ICD-10-CM | POA: Diagnosis not present

## 2017-09-08 DIAGNOSIS — Z0000 Encounter for general adult medical examination without abnormal findings: Secondary | ICD-10-CM

## 2017-09-08 DIAGNOSIS — N401 Enlarged prostate with lower urinary tract symptoms: Secondary | ICD-10-CM | POA: Diagnosis not present

## 2017-09-08 DIAGNOSIS — J309 Allergic rhinitis, unspecified: Secondary | ICD-10-CM

## 2017-09-08 DIAGNOSIS — Z23 Encounter for immunization: Secondary | ICD-10-CM | POA: Diagnosis not present

## 2017-09-08 DIAGNOSIS — Z125 Encounter for screening for malignant neoplasm of prostate: Secondary | ICD-10-CM | POA: Diagnosis not present

## 2017-09-08 DIAGNOSIS — R350 Frequency of micturition: Secondary | ICD-10-CM

## 2017-09-08 DIAGNOSIS — E785 Hyperlipidemia, unspecified: Secondary | ICD-10-CM

## 2017-09-08 MED ORDER — HYDROCHLOROTHIAZIDE 12.5 MG PO CAPS: 12.5000 mg | ORAL_CAPSULE | Freq: Every day | ORAL | 3 refills | Status: DC

## 2017-09-08 MED ORDER — TAMSULOSIN HCL 0.4 MG PO CAPS: 0.8000 mg | ORAL_CAPSULE | Freq: Every day | ORAL | 3 refills | Status: DC

## 2017-09-08 MED ORDER — ROSUVASTATIN CALCIUM 10 MG PO TABS: 10.0000 mg | ORAL_TABLET | Freq: Every day | ORAL | 2 refills | Status: DC

## 2017-09-08 NOTE — Progress Notes (Addendum)
Subjective:  By signing my name below, I, Dustin Mckee, attest that this documentation has been prepared under the direction and in the presence of Dustin FloodJeffrey R Magdiel Bartles, Mckee Electronically Signed: Charline BillsEssence Mckee, ED Scribe 09/08/2017 at 2:24 PM.   Patient ID: Dustin Mckee, male    DOB: 05/20/1956, 61 y.o.   MRN: [id number]  Chief Complaint  Patient presents with  . Annual Exam   HPI Dustin Mckee is a 61 y.o. male who presents to [facility] Care at [facility] for annual exam. H/o hyperlipidemia, GERD, allergic rhinitis, BPH, OSA, hypogonadism.   Environmental Allergies Pt reports some nasal congestion which he has been treating with Claritin and Flonase.  OSA on CPAP Seen 5 days ago by sleep specialist. Planned for CPAP titration.   Hyperlipidemia Lab Results  Component Value Date   CHOL 217 (H) 11/08/2016   HDL 43 11/08/2016   LDLCALC 132 (H) 11/08/2016   TRIG 210 (H) 11/08/2016   CHOLHDL 5.0 11/08/2016    Lab Results  Component Value Date   ALT 23 11/08/2016   AST 18 11/08/2016   ALKPHOS 42 11/08/2016   BILITOT 0.3 11/08/2016  Takes Crestor 10 mg qd. Denies any side-effects.   Hypogonadism Followed by Dr. Lucianne MussKumar. Takes Clomid.   Hyperglycemia Pre-DM with A1C of 5.8 on 9/24. Plans on monitoring on nex visit.   Low Vitamin D Level of 1.2 in December 2017. Treated with 15,000 units once/week. Has not been rechecked recently. Ran out of Vitamin D and has not taken OTC Vitamin D either.   BPH Takes Flomax 0.8 mg qd. Reports urinary frequency which has improved with Flomax. Pt has not tried switching back to 1 tablet. Denies light-headedness, dizziness.   HTN HCTZ 12.5 mg qd.   CA Screening Colonoscopy: 08/2014 Prostate CA Screening:  Lab Results  Component Value Date   PSA 2.77 08/23/2015   PSA 2.10 06/11/2014   Immunizations Immunization History  Administered Date(s) Administered  . Influenza,inj,Quad PF,6+ Mos 08/23/2015, 11/18/2016  . Tdap 06/11/2014  Hep C  Screening: 08/2015 HIV Screening: 08/2015  Depression Screening [facility] 2[facility] 09/08/2017 01/06/2017 11/08/2016 05/25/2016 05/03/2016  Decreased Interest 0 0 0 0 0  Down, Depressed, Hopeless 0 0 0 0 0  PHQ - 2 Score 0 0 0 0 0     Visual Acuity Screening   Right eye Left eye Both eyes  Without correction:     With correction: 20/13 20/13-2 20/13   Vision: last seen 1 year ago Dentist: sees regularly  Exercise: states he has been exercising more than last year  Patient Active Problem List   Diagnosis Date Noted  . Hyperlipidemia 09/05/2014  . Unspecified vitamin D deficiency 06/14/2014  . BPH (benign prostatic hypertrophy) with urinary obstruction 06/14/2014  . GERD 01/23/2009  . HYPERLIPIDEMIA 12/14/2008  . ALLERGIC RHINITIS 12/14/2008  . SLEEP APNEA 11/29/2008  . DYSPNEA 11/29/2008   Past Medical History:  Diagnosis Date  . Allergy   . Hyperlipidemia   . Hypertension   . Sleep apnea    Past Surgical History:  Procedure Laterality Date  . APPENDECTOMY    . TOOTH EXTRACTION    . VASECTOMY     Allergies  Allergen Reactions  . Iodine Hives   Prior to Admission medications   Medication Sig Start Date End Date Taking? Authorizing Provider  clomiPHENE (CLOMID) 50 MG tablet Half tablet 3 times a week 06/23/17   Dustin LittlerKumar, Ajay, Mckee  hydrochlorothiazide (MICROZIDE) 12.5 MG capsule Take 1  capsule (12.5 mg total) by mouth daily. 11/08/16   Dustin Neighbors, FNP  rosuvastatin (CRESTOR) 10 MG tablet  11/08/16   Dustin Mckee  tamsulosin (FLOMAX) 0.4 MG CAPS capsule Take 2 capsules (0.8 mg total) by mouth daily. 12/06/16   Dustin Neighbors, FNP   Social History   Social History  . Marital status: Married    Spouse name: Dustin Mckee  . Number of children: N/A  . Years of education: N/A   Occupational History  . Not on file.   Social History Main Topics  . Smoking status: Never Smoker  . Smokeless tobacco: Never Used  . Alcohol use No  . Drug use: No    . Sexual activity: Yes    Birth control/ protection: None   Other Topics Concern  . Not on file   Social History Narrative   Drinks 1-2 cups caffeine daily.   Review of Systems  HENT: Positive for congestion.   Allergic/Immunologic: Positive for environmental allergies.      Objective:   Physical Exam  Constitutional: He is oriented to person, place, and time. He appears well-developed and well-nourished.  HENT:  Head: Normocephalic and atraumatic.  Right Ear: External ear normal.  Left Ear: External ear normal.  Mouth/Throat: Oropharynx is clear and moist.  Eyes: Pupils are equal, round, and reactive to light. Conjunctivae and EOM are normal.  Neck: Normal range of motion. Neck supple. No thyromegaly present.  Cardiovascular: Normal rate, regular rhythm, normal heart sounds and intact distal pulses.   Pulmonary/Chest: Effort normal and breath sounds normal. No respiratory distress. He has no wheezes.  Abdominal: Soft. He exhibits no distension. There is no tenderness. Hernia confirmed negative in the right inguinal area and confirmed negative in the left inguinal area.  Genitourinary: Prostate is enlarged (Borderline-enlarged, no apparent nodules.). Prostate is not tender.  Musculoskeletal: Normal range of motion. He exhibits no edema or tenderness.  Lymphadenopathy:    He has no cervical adenopathy.  Neurological: He is alert and oriented to person, place, and time. He has normal reflexes.  Skin: Skin is warm and dry.  Psychiatric: He has a normal mood and affect. His behavior is normal.  Vitals reviewed.    Vitals:   09/08/17 1402  BP: 112/60  Pulse: 81  Resp: 18  Temp: 98.7 F (37.1 C)  TempSrc: Oral  SpO2: 96%  Weight: 254 lb 9.6 oz (115.5 kg)  Height: 6' 1.23" (1.86 m)      Assessment & Plan:   Dustin Mckee is a 61 y.o. male Annual physical exam - Plan: Comprehensive metabolic panel, Lipid panel, TSH  - -anticipatory guidance as below in AVS, screening  labs above. Health maintenance items as above in HPI discussed/recommended as applicable.   Need for influenza vaccination - Plan: Flu Vaccine QUAD 36+ mos IM  Allergic rhinitis, unspecified seasonality, unspecified trigger  - Over-the-counter medication with symptomatic care discussed, RTC precautions   Benign prostatic hyperplasia with urinary frequency - Plan: tamsulosin (FLOMAX) 0.4 MG CAPS capsule, PSA  - Stable on 0.8 mg Flomax. Continue same dose, check PSA.  Essential hypertension - Plan: hydrochlorothiazide (MICROZIDE) 12.5 MG capsule  -Stable, continue same dose HCTZ. refilled for 1 year.  Hyperlipidemia, unspecified hyperlipidemia type - Plan: rosuvastatin (CRESTOR) 10 MG tablet  - Tolerating Crestor, no changes.  Screening for prostate cancer - Plan: PSA  We discussed pros and cons of prostate cancer screening, and after this discussion, he chose to have screening done. PSA obtained,  and no concerning findings on DRE.    Meds ordered this encounter  Medications  . hydrochlorothiazide (MICROZIDE) 12.5 MG capsule    Sig: Take 1 capsule (12.5 mg total) by mouth daily.    Dispense:  90 capsule    Refill:  3  . tamsulosin (FLOMAX) 0.4 MG CAPS capsule    Sig: Take 2 capsules (0.8 mg total) by mouth daily.    Dispense:  180 capsule    Refill:  3  . rosuvastatin (CRESTOR) 10 MG tablet    Sig: Take 1 tablet (10 mg total) by mouth daily.    Dispense:  90 tablet    Refill:  2   Patient Instructions       IF you received an x-ray today, you will receive an invoice from [facility]. Please contact [facility] at [phone] with questions or concerns regarding your invoice.   IF you received labwork today, you will receive an invoice from [facility]. Please contact [facility] at [phone] with questions or concerns regarding your invoice.   Our billing staff will not be able to assist you with questions regarding Mckee from these companies.  You will be  contacted with the lab results as soon as they are available. The fastest way to get your results is to activate your My Chart account. Instructions are located on the last page of this paperwork. If you have not heard from [place] regarding the results in 2 weeks, please contact this office.      I personally performed the services described in this documentation, which was scribed in my presence. The recorded information has been reviewed and considered for accuracy and completeness, addended by me as needed, and agree with information above.  Signed,   [name], [facility] at [facility].  09/08/17 7:25 PM

## 2017-09-08 NOTE — Patient Instructions (Signed)
     IF you received an x-ray today, you will receive an invoice from [facility]. Please contact [facility] at [phone] with questions or concerns regarding your invoice.   IF you received labwork today, you will receive an invoice from [facility]. Please contact [facility] at [phone] with questions or concerns regarding your invoice.   Our billing staff will not be able to assist you with questions regarding bills from these companies.  You will be contacted with the lab results as soon as they are available. The fastest way to get your results is to activate your My Chart account. Instructions are located on the last page of this paperwork. If you have not heard from us regarding the results in 2 weeks, please contact this office.     

## 2017-09-08 NOTE — Telephone Encounter (Signed)
[facility] needing [facility] on a [facility].  They are wondering if the hydrochlorothiazide is adding to the combo or replacing it completely. Please advise [phone]

## 2017-09-09 LAB — LIPID PANEL
CHOL/HDL RATIO: 2.8 ratio (ref 0.0–5.0)
Cholesterol, Total: 128 mg/dL (ref 100–199)
HDL: 46 mg/dL (ref >39)
LDL CALC: 61 mg/dL (ref 0–99)
TRIGLYCERIDES: 103 mg/dL (ref 0–149)
VLDL CHOLESTEROL CAL: 21 mg/dL (ref 5–40)

## 2017-09-09 LAB — COMPREHENSIVE METABOLIC PANEL
ALBUMIN: 4.6 g/dL (ref 3.5–5.5)
ALT: 20 IU/L (ref 0–44)
AST: 18 IU/L (ref 0–40)
Albumin/Globulin Ratio: 1.8 (ref 1.2–2.2)
Alkaline Phosphatase: 34 IU/L — ABNORMAL LOW (ref 39–117)
BUN/Creatinine Ratio: 12 (ref 9–20)
BUN: 15 mg/dL (ref 6–24)
Bilirubin Total: 0.3 mg/dL (ref 0.0–1.2)
CALCIUM: 9.3 mg/dL (ref 8.7–10.2)
CO2: 21 mmol/L (ref 20–29)
CREATININE: 1.29 mg/dL — AB (ref 0.76–1.27)
Chloride: 103 mmol/L (ref 96–106)
GFR calc Af Amer: 70 mL/min/{1.73_m2} (ref >59)
GFR, EST NON AFRICAN AMERICAN: 60 mL/min/{1.73_m2} (ref >59)
GLOBULIN, TOTAL: 2.5 g/dL (ref 1.5–4.5)
Glucose: 96 mg/dL (ref 65–99)
Potassium: 4.4 mmol/L (ref 3.5–5.2)
SODIUM: 143 mmol/L (ref 134–144)
Total Protein: 7.1 g/dL (ref 6.0–8.5)

## 2017-09-09 LAB — TSH: TSH: 2.82 u[IU]/mL (ref 0.450–4.500)

## 2017-09-09 LAB — PSA: Prostate Specific Ag, Serum: 2.8 ng/mL (ref 0.0–4.0)

## 2017-09-10 NOTE — Telephone Encounter (Signed)
[facility].  LMOVM - unable to talk to human.  This is only rx per Dr. Neva SeatGreene.  We do not have a record of other med that was in combo with HCTZ.  Asked them to return call if this is not clear.

## 2017-09-17 MED FILL — HYDROCHLOROTHIAZIDE 12.5 MG: 12.5 | 90 days supply | Qty: 90 | Fill #0

## 2017-09-17 MED FILL — CLOMIPHENE CITRATE 50 MG TA: 50 | 42 days supply | Qty: 10 | Fill #2

## 2017-10-14 ENCOUNTER — Ambulatory Visit (INDEPENDENT_AMBULATORY_CARE_PROVIDER_SITE_OTHER): Payer: 59 | Admitting: Neurology

## 2017-10-14 DIAGNOSIS — G4719 Other hypersomnia: Secondary | ICD-10-CM

## 2017-10-14 DIAGNOSIS — G4733 Obstructive sleep apnea (adult) (pediatric): Secondary | ICD-10-CM

## 2017-10-14 DIAGNOSIS — J321 Chronic frontal sinusitis: Secondary | ICD-10-CM

## 2017-10-14 DIAGNOSIS — Z9989 Dependence on other enabling machines and devices: Principal | ICD-10-CM

## 2017-10-14 DIAGNOSIS — J301 Allergic rhinitis due to pollen: Secondary | ICD-10-CM

## 2017-10-15 ENCOUNTER — Telehealth: Payer: Self-pay | Admitting: Neurology

## 2017-10-15 ENCOUNTER — Other Ambulatory Visit: Payer: Self-pay | Admitting: Neurology

## 2017-10-15 DIAGNOSIS — G4719 Other hypersomnia: Secondary | ICD-10-CM | POA: Insufficient documentation

## 2017-10-15 DIAGNOSIS — R0602 Shortness of breath: Secondary | ICD-10-CM

## 2017-10-15 DIAGNOSIS — G4733 Obstructive sleep apnea (adult) (pediatric): Secondary | ICD-10-CM

## 2017-10-15 DIAGNOSIS — J301 Allergic rhinitis due to pollen: Secondary | ICD-10-CM | POA: Insufficient documentation

## 2017-10-15 DIAGNOSIS — J321 Chronic frontal sinusitis: Secondary | ICD-10-CM | POA: Insufficient documentation

## 2017-10-15 NOTE — Telephone Encounter (Signed)
-----   Message from Melvyn Novasarmen Dohmeier, MD sent at 10/15/2017 12:48 PM EST ----- OSA was easily treated at low pressures of CPAP. CPAP at 7 cm water, heated humidity and  nasal pillow AirFit p10 in large size.

## 2017-10-15 NOTE — Procedures (Signed)
PATIENT'S NAME:  Dustin Mckee, Dustin A. DOB:      Feb 24, 1956      MR#:    [id number]     DATE OF RECORDING: 10/14/2017 REFERRING M.D.:  Collie SiadZoe Stallings, M.D. Study Performed:   Titration to CPAP  HISTORY:  61 year old African-American gentleman who had not followed up on his previous HST performed [facility]. The patient's AHI was 28.6 and his oxygen nadir at 82%, he definitely qualified for a new CPAP machine, but he did not return for a CPAP titration until now. He has a 59 year old CPAP at home !  He has HTN, obesity, EDS and Nocturia. The patient endorsed the Epworth Sleepiness Scale at 12 points.  The patient's weight 257 pounds with a height of 75 (inches), resulting in a BMI of 32.1 kg/m2.The patient's neck circumference measured 18.5 inches.  CURRENT MEDICATIONS: Clomid, Microzide, Crestor, Flomax  PROCEDURE:  This is a multichannel digital polysomnogram utilizing the SomnoStar 11.2 system.  Electrodes and sensors were applied and monitored per AASM Specifications.   EEG, EOG, Chin and Limb EMG, were sampled at 200 Hz.  ECG, Snore and Nasal Pressure, Thermal Airflow, Respiratory Effort, CPAP Flow and Pressure, Oximetry was sampled at 50 Hz. Digital video and audio were recorded.      CPAP was initiated at 5 cmH20 with heated humidity per AASM split night standards and pressure was directly advanced to 7cmH20.  At a PAP pressure of 7.0 cmH20, there was a reduction of the AHI to 0.0 with improvement of obstructive sleep apnea, SpO2 nadir was 91% and sleep efficiency was 98.2% .    Lights Out was at 21:20 and Lights On at 05:01. Total recording time (TRT) was 461 minutes, with a total sleep time (TST) of 446.5 minutes. The patient's sleep latency was 6 minutes with 0 minutes of wake time after sleep onset. REM latency was 64 minutes.  The sleep efficiency was 96.9 %.    SLEEP ARCHITECTURE: WASO (Wake after sleep onset) was 8.5 minutes.  There were 9 minutes in Stage N1, 217 minutes  Stage N2, 46.5 minutes Stage N3 and 174 minutes in Stage REM.  The percentage of Stage N1 was 2.%, Stage N2 was 48.6%, Stage N3 was 10.4% and Stage R (REM sleep) was 39.%.   The arousals were noted as: 66 were spontaneous, 0 were associated with PLMs, and 0 were associated with respiratory events. Snoring was alleviated, no nocturia was recorded. EKG was in keeping with normal sinus rhythm (NSR).   RESPIRATORY ANALYSIS:  There was a total of 0 respiratory events: 0 obstructive apneas, 0 central apneas and 0 mixed apneas with a total of 0 apneas and an apnea index (AI) of 0 /hour. There were 0 hypopneas with a hypopnea index of 0/hour. The patient also had 0 respiratory event related arousals (RERAs).      The total APNEA/HYPOPNEA INDEX  (AHI) was 0 /hour and the total RESPIRATORY DISTURBANCE INDEX was 0 .hour  0 events occurred in REM sleep and 0 events in NREM. The REM AHI was 0 /hour versus a non-REM AHI of 0 /hour.  The patient spent 199 minutes of total sleep time in the supine position and 248 minutes in non-supine. The supine AHI was 0.0, versus a non-supine AHI of 0.0.  OXYGEN SATURATION & C02:  The baseline 02 saturation was 94%, with the lowest being 91%. Time spent below 89% saturation equaled 0 minutes.  PERIODIC LIMB MOVEMENTS:    The  patient had a total of 0 Periodic Limb Movements.  Post-study, the patient indicated that sleep was improved.  The patient was fitted with a ResMed AirFit p10 with large pillows.  DIAGNOSIS 1. Obstructive Sleep Apnea responding well to only 7 cm water CPAP on nasal pillow, heated humidity.   PLANS/RECOMMENDATIONS: CPAP at 7 cm water, heated humidity and nasal AirFit p10 in large size.   A follow up appointment will [facility].   Please call [phone] with any questions.     I certify that I have reviewed the entire raw data recording prior to the issuance of this report in accordance with the  Standards of Accreditation of the American Academy of Sleep Medicine (AASM)    Melvyn Novasarmen Kalista Laguardia, M.D.    10-15-2017  Diplomat, American Board of Psychiatry and Neurology  Diplomat, American Board of Sleep Medicine Medical Director, [facility]

## 2017-10-17 MED FILL — TAMSULOSIN HCL 0.4 MG CAP: 0.4 | 90 days supply | Qty: 180 | Fill #3

## 2017-10-29 DIAGNOSIS — G4733 Obstructive sleep apnea (adult) (pediatric): Secondary | ICD-10-CM | POA: Diagnosis not present

## 2017-11-03 MED FILL — LISINOPRIL-HCTZ 20-12.5 MG: 20-12.5 | 90 days supply | Qty: 90 | Fill #2

## 2017-11-03 MED FILL — CLOMIPHENE CITRATE 50 MG TA: 50 | 42 days supply | Qty: 10 | Fill #3

## 2017-11-03 MED FILL — ROSUVASTATIN CALCIUM 20 MG: 20 | 90 days supply | Qty: 90 | Fill #0 | Status: TO

## 2017-12-16 ENCOUNTER — Other Ambulatory Visit: Payer: Self-pay | Admitting: Endocrinology

## 2017-12-16 DIAGNOSIS — E230 Hypopituitarism: Secondary | ICD-10-CM

## 2017-12-19 ENCOUNTER — Other Ambulatory Visit (INDEPENDENT_AMBULATORY_CARE_PROVIDER_SITE_OTHER): Payer: BLUE CROSS/BLUE SHIELD

## 2017-12-19 DIAGNOSIS — E230 Hypopituitarism: Secondary | ICD-10-CM

## 2017-12-19 LAB — LUTEINIZING HORMONE: LH: 5.36 m[IU]/mL (ref 1.50–9.30)

## 2017-12-19 LAB — TESTOSTERONE: Testosterone: 283.29 ng/dL — ABNORMAL LOW (ref 300.00–890.00)

## 2017-12-23 NOTE — Progress Notes (Signed)
Patient ID: Dustin Mckee, male   DOB: 01/26/1956, 62 y.o.   MRN: [id number]            Referring physician: Karyl KinnierJeffrey Mckee  Reason for consultation: low testosterone   Chief complaint: fatigue  History of Present Illness  Hypogonadismwas diagnosed in 2012 by his previous primary care physician when he was having complaints of fatigue and lethargy He does not know the details about his evaluation and no records are available However he was treated with a testosterone gel for several months with improvement in his symptoms Not clear why he stopped taking the treatment  He has had had complaints offatigue,  Lethargy, decreased motivation, decreased libido  Since late last year He had testosterone levels levels that were significantly low  With his evaluation 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, prolactin upper normal He has been on CLOMIPHENE half tablet 3 times a week since early April 2017  RECENT history: On his follow-up in 5/18 he had reported improved with his energy level but not to normal, and his libido was improving With being out of her medication in July 2018 he had recurrence of the fatigue and his level had gone down to 194  However with resuming her medication 3 times a week follow-up level in September was up to 326  He is again doing fairly well with taking his medication consistently 3 days a week although he is taking this on Tuesday through Thursday He does not complain of feeling fatigued but is concerned about having decreased libido which has not improved He says that he feels fairly good and with his increased energy has started back on exercise this month which he had not done in a while However his weight has gone up since last visit  Testosterone level is back to below 300, done in the morning   lab results showtestosterone levels of:  Lab Results  Component Value Date   TESTOSTERONE  283.29 (L) 12/19/2017   TESTOSTERONE 325.92 08/04/2017   TESTOSTERONE 194.14 (L) 06/19/2017   TESTOSTERONE 307.31 03/21/2017    Baseline free testosterone level was decreased at 19, normal  >35.8  Prolactin level: 15.4  Lab Results  Component Value Date   LH 5.36 12/19/2017          Allergies as of 12/24/2017      Reactions   Iodine Hives      Medication List        Accurate as of 12/24/17  8:45 AM. Always use your most recent med list.          clomiPHENE 50 MG tablet Commonly known as:  CLOMID Half tablet 3 times a week   hydrochlorothiazide 12.5 MG capsule Commonly known as:  MICROZIDE Take 1 capsule (12.5 mg total) by mouth daily.   rosuvastatin 10 MG tablet Commonly known as:  CRESTOR Take 1 tablet (10 mg total) by mouth daily.   tamsulosin 0.4 MG Caps capsule Commonly known as:  FLOMAX Take 2 capsules (0.8 mg total) by mouth daily.       Allergies:  Allergies  Allergen Reactions  . Iodine Hives    Past Medical History:  Diagnosis Date  . Allergy   . Hyperlipidemia   . Hypertension   . Sleep apnea     Past Surgical History:  Procedure Laterality Date  . APPENDECTOMY    . TOOTH EXTRACTION    . VASECTOMY      Family  History  Problem Relation Age of Onset  . Heart disease Mother   . Kidney disease Mother   . Heart disease Father        defibrillator/CHF.  . Diabetes Father   . Heart disease Sister        CHF  . Diabetes Sister   . Heart disease Sister        CHF    Social History:  reports that  has never smoked. he has never used smokeless tobacco. He reports that he does not drink alcohol or use drugs.  Review of Systems  He has prediabetes with fasting glucose 105 and A1c  Previously 5.8  This has been periodically checked by PCP  His maximum weight has been about 290, his weight was recorded as 284 in 04/2016  Exercising since 2/19   Wt Readings from Last 3 Encounters:  12/24/17 268 lb (121.6 kg)  09/08/17 254  lb 9.6 oz (115.5 kg)  09/03/17 257 lb (116.6 kg)    General Examination:   BP 130/72   Pulse 85   Ht 6\' 1"  (1.854 m)   Wt 268 lb (121.6 kg)   SpO2 92%   BMI 35.36 kg/m    Assessment/ Plan:  Hypogonadotropic Hypogonadism, symptomatic with decreased free testosterone level  He had done subjectively fairly well with his energy level Taking 25 mg clomiphene 3 times a week now Although his testosterone level is slightly lower than last year he is objectively doing very well with energy level  He is having some decreased libido now but his testosterone level is also below 300 fasting LH is normal  Discussed that if he has better testosterone levels with taking clomiphene for days a week and libido is improved he can continue otherwise consider AndroGel again He will add another half tablet on Sundays  Impaired fasting glucose: He has just started exercising and hopefully will lose weight, appears to be more motivated  Will recheck his glucose on the next visit along with A1c  Total visit time 15 minutes  Dustin Mckee 12/24/2017, 8:45 AM

## 2017-12-24 ENCOUNTER — Ambulatory Visit (INDEPENDENT_AMBULATORY_CARE_PROVIDER_SITE_OTHER): Payer: BLUE CROSS/BLUE SHIELD | Admitting: Endocrinology

## 2017-12-24 ENCOUNTER — Encounter: Payer: Self-pay | Admitting: Endocrinology

## 2017-12-24 VITALS — BP 130/72 | HR 85 | Ht 73.0 in | Wt 268.0 lb

## 2017-12-24 DIAGNOSIS — E230 Hypopituitarism: Secondary | ICD-10-CM | POA: Diagnosis not present

## 2017-12-24 DIAGNOSIS — R7301 Impaired fasting glucose: Secondary | ICD-10-CM | POA: Diagnosis not present

## 2017-12-24 MED ORDER — CLOMIPHENE CITRATE 50 MG PO TABS: ORAL_TABLET | ORAL | 5 refills | Status: DC

## 2017-12-24 NOTE — Patient Instructions (Signed)
Extra 1/2 on sunday

## 2017-12-30 DIAGNOSIS — G4733 Obstructive sleep apnea (adult) (pediatric): Secondary | ICD-10-CM | POA: Diagnosis not present

## 2018-01-06 MED FILL — CLOMIPHENE CITRATE 50 MG TA: 50 | 28 days supply | Qty: 8 | Fill #0 | Status: TO

## 2018-01-12 ENCOUNTER — Encounter: Payer: Self-pay | Admitting: Nurse Practitioner

## 2018-01-12 NOTE — Progress Notes (Signed)
[facility]  PATIENT: Dustin Mckee DOB: 12/13/1955   REASON FOR VISIT: Follow-up for obstructive sleep apnea with initial CPAP compliance HISTORY FROM: Patient    HISTORY OF PRESENT ILLNESS:UPDATE 3/5/2019CM Dustin Mckee, 62 year old male returns for follow-up with history of obstructive sleep apnea on CPAP.  He denies any problems with his machine.  Compliance data dated 12/14/2017-01/12/2018 shows compliance greater than 4 hours at 100%.  Average usage 6 hours 58 minutes set pressure 7 cm.  EPR 3 AHI 2 Leaks 95th percentile 1.4.  ESS 1.  He returns for reevaluation  10/24/18CDThe pleasure of seeing [name] today, and meanwhile 62 year old African-American gentleman whom I last saw in 2016. Today's 09/03/2017 and the patient had not followed up on his previous sleep study performed [facility]. The patient's AHI of a home sleep test was 28.6 and his oxygen nadir at 82%, he definitely qualified for a new CPAP machine but he did not return for a CPAP titration. The patient is insured through the [facility]. I will invite him for a CPAP titration ASAP.   Dustin Mckee is a 62 y.o. male , seen here as a referral from Dr. Nichola SizerBobby Doolittle, MD  / Deliah BostonMichael Clark, PA at [facility] for a sleep consultation.  Chief complaint according to patient :" I don't have the same benefit by using CPAP".  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  Dustin Mckee reports that in the year 2004 he was diagnosed with sleep apnea at [facility] and a CPAP machine was prescribed. About 6 years later around 2010 there was an adjustment made to the pressures. Since then and in between he did not require any changes and he has used his CPAP faithfully ever since. Since 2010 he has gained weight and  that may be a factor in his resurgence of sleepiness. He feels more fatigued unable to multitask or focus as well. A download was obtained today in office showing that his CPAP has been used on average 6 hours and 47 minutes per day and that he has is to 30 out of 30 days. His compliance is 96% which is excellent. Unfortunately he was prescribed an escape model which does not allow access to therapeutic data such as residual AHI. He is using and nasal pillow model as interface.  Sleep habits are as follows: Patient works daytime job, his usual bedtime is between 10 and 11:00 PM, he falls asleep promptly with his CPAP. He used to have nocturia up to 3 or 4 times at night and to before he started on Flomax now he will go to the bathroom perhaps once. He has noted that he doesn't seem to dream as much anymore. He sleeps otherwise well through the night , on his left side, until he rises in the morning at 6 AM. He wakes up spontaneously without alarm. The bedroom is cool, quiet and dark. He shares the bedroom with his spouse. He will take a couple of coffee in the morning and commute to work. His workplace does not have access to natural daylight. He has found himself during the day sometimes for a couple of seconds to microsleep. He does not schedule any naps. After work around 6 PM he often feels that he is tired enough to take a nap and  sometimes he will allow himself to nap for 30 minutes, but this is rare.   REVIEW OF SYSTEMS: Full 14 system review of systems performed and notable only for those listed, all others are neg:  Constitutional: neg  Cardiovascular: neg Ear/Nose/Throat: neg  Skin: neg Eyes: neg Respiratory: neg Gastroitestinal: neg  Hematology/Lymphatic: neg  Endocrine: neg Musculoskeletal:neg Allergy/Immunology: neg Neurological: neg Psychiatric: neg Sleep : Obstructive sleep apnea with CPAP   ALLERGIES: Allergies  Allergen Reactions  . Iodine Hives    HOME  MEDICATIONS: Outpatient Medications Prior to Visit  Medication Sig Dispense Refill  . clomiPHENE (CLOMID) 50 MG tablet Half tablet 4 times a week 12 tablet 5  . hydrochlorothiazide (MICROZIDE) 12.5 MG capsule Take 1 capsule (12.5 mg total) by mouth daily. 90 capsule 3  . rosuvastatin (CRESTOR) 10 MG tablet Take 1 tablet (10 mg total) by mouth daily. 90 tablet 2  . tamsulosin (FLOMAX) 0.4 MG CAPS capsule Take 2 capsules (0.8 mg total) by mouth daily. 180 capsule 3   No facility-administered medications prior to visit.     PAST MEDICAL HISTORY: Past Medical History:  Diagnosis Date  . Allergy   . Hyperlipidemia   . Hypertension   . Sleep apnea    CPAP    PAST SURGICAL HISTORY: Past Surgical History:  Procedure Laterality Date  . APPENDECTOMY    . TOOTH EXTRACTION    . VASECTOMY      FAMILY HISTORY: Family History  Problem Relation Age of Onset  . Heart disease Mother   . Kidney disease Mother   . Heart disease Father        defibrillator/CHF.  . Diabetes Father   . Heart disease Sister        CHF  . Diabetes Sister   . Heart disease Sister        CHF    SOCIAL HISTORY: Social History   Socioeconomic History  . Marital status: Married    Spouse name: [name]  . Number of children: Not on file  . Years of education: Not on file  . Highest education level: Not on file  Social Needs  . Financial resource strain: Not on file  . Food insecurity - worry: Not on file  . Food insecurity - inability: Not on file  . Transportation needs - medical: Not on file  . Transportation needs - non-medical: Not on file  Occupational History  . Not on file  Tobacco Use  . Smoking status: Never Smoker  . Smokeless tobacco: Never Used  Substance and Sexual Activity  . Alcohol use: No  . Drug use: No  . Sexual activity: Yes    Birth control/protection: None  Other Topics Concern  . Not on file  Social History Narrative   Drinks 1-2 cups caffeine daily.     PHYSICAL  EXAM  Vitals:   01/13/18 1313  BP: 134/69  Pulse: 80  Weight: 265 lb 3.2 oz (120.3 kg)   Body mass index is 34.99 kg/m.  Generalized: Well developed, obese male in no acute distress  Head: normocephalic and atraumatic,. Oropharynx benign  Neck: Supple, Musculoskeletal: No deformity   Neurological examination   Mentation: Alert oriented to time, place, history taking. Attention span and concentration appropriate. Recent and remote memory intact.  Follows all commands speech and language fluent.   Cranial nerve II-XII: Pupils were equal round reactive to light extraocular movements were full, visual field were full on confrontational test. Facial sensation and strength were normal. hearing  was intact to finger rubbing bilaterally. Uvula tongue midline. head turning and shoulder shrug were normal and symmetric.Tongue protrusion into cheek strength was normal. Motor: normal bulk and tone, full strength in the BUE, BLE,  Sensory: normal and symmetric to light touch, Coordination: finger-nose-finger, heel-to-shin bilaterally, no dysmetria Gait and Station: Rising up from seated position without assistance, normal stance,  moderate stride, good arm swing, smooth turning, able to perform tiptoe, and heel walking without difficulty. Tandem gait is steady  DIAGNOSTIC DATA (LABS, IMAGING, TESTING) - I reviewed patient records, labs, notes, testing and imaging myself where available.  Lab Results  Component Value Date   WBC 5.3 11/08/2016   HGB 15.4 11/08/2016   HCT 44.7 11/08/2016   MCV 85 11/08/2016   PLT 223 11/08/2016      Component Value Date/Time   NA 143 09/08/2017 1556   K 4.4 09/08/2017 1556   CL 103 09/08/2017 1556   CO2 21 09/08/2017 1556   GLUCOSE 96 09/08/2017 1556   GLUCOSE 100 (H) 08/04/2017 0913   BUN 15 09/08/2017 1556   CREATININE 1.29 (H) 09/08/2017 1556   CREATININE 1.14 04/17/2016 0950   CALCIUM 9.3 09/08/2017 1556   PROT 7.1 09/08/2017 1556   ALBUMIN 4.6  09/08/2017 1556   AST 18 09/08/2017 1556   ALT 20 09/08/2017 1556   ALKPHOS 34 (L) 09/08/2017 1556   BILITOT 0.3 09/08/2017 1556   GFRNONAA 60 09/08/2017 1556   GFRNONAA 71 08/23/2015 1501   GFRAA 70 09/08/2017 1556   GFRAA 82 08/23/2015 1501   Lab Results  Component Value Date   CHOL 128 09/08/2017   HDL 46 09/08/2017   LDLCALC 61 09/08/2017   TRIG 103 09/08/2017   CHOLHDL 2.8 09/08/2017   Lab Results  Component Value Date   HGBA1C 5.8 08/04/2017    Lab Results  Component Value Date   TSH 2.820 09/08/2017      ASSESSMENT AND PLAN  62 y.o. year old male  has a past medical history of Allergy, Hyperlipidemia, Hypertension, and Sleep apnea. here to follow-up for initial CPAP compliance.Data dated 12/14/2017-01/12/2018 shows compliance greater than 4 hours at 100%.  Average usage 6 hours 58 minutes set pressure 7 cm.  EPR 3 AHI 2 Leaks 95th percentile 1.4.  ESS 1.    CPAP compliance 100% reviewed data with patient Continue same settings Follow-up in 6 months then yearly Portions of the above document her copy from prior visit for review purposes only Nilda Riggs, [facility], [employer], APRN  [facility] [address] [place], [place] [address] [phone]

## 2018-01-13 ENCOUNTER — Encounter: Payer: Self-pay | Admitting: Nurse Practitioner

## 2018-01-13 ENCOUNTER — Ambulatory Visit: Payer: BLUE CROSS/BLUE SHIELD | Admitting: Nurse Practitioner

## 2018-01-13 DIAGNOSIS — Z9989 Dependence on other enabling machines and devices: Secondary | ICD-10-CM

## 2018-01-13 DIAGNOSIS — G4733 Obstructive sleep apnea (adult) (pediatric): Secondary | ICD-10-CM

## 2018-01-13 NOTE — Progress Notes (Signed)
I agree with the assessment and plan as directed by NP .The patient is known to me . Yearly routine follow up alternating between Np and MD .   Melvyn NovasHMEIER,Lelia Jons, MD

## 2018-01-13 NOTE — Patient Instructions (Signed)
CPAP compliance 100% Continue same settings Follow-up in 6 months then yearly  

## 2018-01-22 MED FILL — TAMSULOSIN HCL 0.4 MG CAP: 0.4 | 30 days supply | Qty: 60 | Fill #0 | Status: TO

## 2018-01-27 DIAGNOSIS — G4733 Obstructive sleep apnea (adult) (pediatric): Secondary | ICD-10-CM | POA: Diagnosis not present

## 2018-02-27 DIAGNOSIS — G4733 Obstructive sleep apnea (adult) (pediatric): Secondary | ICD-10-CM | POA: Diagnosis not present

## 2018-03-20 ENCOUNTER — Other Ambulatory Visit: Payer: BLUE CROSS/BLUE SHIELD

## 2018-03-20 DIAGNOSIS — R7301 Impaired fasting glucose: Secondary | ICD-10-CM | POA: Diagnosis not present

## 2018-03-20 DIAGNOSIS — E230 Hypopituitarism: Secondary | ICD-10-CM | POA: Diagnosis not present

## 2018-03-20 LAB — GLUCOSE, RANDOM: GLUCOSE: 100 mg/dL — AB (ref 70–99)

## 2018-03-20 LAB — TESTOSTERONE: TESTOSTERONE: 273.42 ng/dL — AB (ref 300.00–890.00)

## 2018-03-20 LAB — LUTEINIZING HORMONE: LH: 6.04 m[IU]/mL (ref 1.50–9.30)

## 2018-03-20 LAB — HEMOGLOBIN A1C: HEMOGLOBIN A1C: 5.7 % (ref 4.6–6.5)

## 2018-03-23 NOTE — Progress Notes (Signed)
Patient ID: Dustin Mckee, male   DOB: 06-21-1956, 62 y.o.   MRN: [id number]            Referring physician: Karyl Mckee  Reason for consultation: low testosterone   Chief complaint: fatigue  History of Present Illness  Hypogonadismwas diagnosed in 2012 by his previous primary care physician when he was having complaints of fatigue and lethargy He does not know the details about his evaluation and no records are available However he was treated with a testosterone gel for several months with improvement in his symptoms Not clear why he stopped taking the treatment  He has had had complaints offatigue,  Lethargy, decreased motivation, decreased libido  Since late last year He had testosterone levels levels that were significantly low  With his evaluation 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, prolactin upper normal He has been on CLOMIPHENE half tablet 3 times a week since early April 2017  RECENT history: On his follow-up in 5/18 he had reported improved with his energy level but not to normal, and his libido was improving With being out of her medication in July 2018 he had recurrence of the fatigue and his level had gone down to 194  However with resuming  medication 3 times a week follow-up level in September was up to 326 Subsequently his levels have come down slowly and he is now having some decreased energy level, lower motivation level Also has some decreased libido In 2/19 he was told to increase the clomiphene to 4 days a week He is trying to exercise but is not able to do as much as he wants to  Fasting labs showtestosterone levels of:  Lab Results  Component Value Date   TESTOSTERONE 273.42 (L) 03/20/2018   TESTOSTERONE 283.29 (L) 12/19/2017   TESTOSTERONE 325.92 08/04/2017   TESTOSTERONE 194.14 (L) 06/19/2017    Baseline free testosterone level was decreased at 19, normal  >35.8  Prolactin  level: 15.4  Lab Results  Component Value Date   LH 6.04 03/20/2018      Diabetes screening: See review of systems     Allergies as of 03/24/2018      Reactions   Iodine Hives      Medication List        Accurate as of 03/24/18  8:31 AM. Always use your most recent med list.          clomiPHENE 50 MG tablet Commonly known as:  CLOMID Half tablet 4 times a week   hydrochlorothiazide 12.5 MG capsule Commonly known as:  MICROZIDE Take 1 capsule (12.5 mg total) by mouth daily.   rosuvastatin 10 MG tablet Commonly known as:  CRESTOR Take 1 tablet (10 mg total) by mouth daily.   tamsulosin 0.4 MG Caps capsule Commonly known as:  FLOMAX Take 2 capsules (0.8 mg total) by mouth daily.       Allergies:  Allergies  Allergen Reactions  . Iodine Hives    Past Medical History:  Diagnosis Date  . Allergy   . Hyperlipidemia   . Hypertension   . Sleep apnea    CPAP    Past Surgical History:  Procedure Laterality Date  . APPENDECTOMY    . TOOTH EXTRACTION    . VASECTOMY      Family History  Problem Relation Age of Onset  . Heart disease Mother   . Kidney disease Mother   . Heart disease Father  defibrillator/CHF.  . Diabetes Father   . Heart disease Sister        CHF  . Diabetes Sister   . Heart disease Sister        CHF    Social History:  reports that he has never smoked. He has never used smokeless tobacco. He reports that he does not drink alcohol or use drugs.  Review of Systems  He has had prediabetes with fasting glucose 105 and A1c 5.8  Glucose is right at 100 now and A1c is 5.7 He is trying to exercise but is not able to lose weight  His maximum weight has been about 290, his weight was recorded as 284 in 04/2016  Exercising since 2/19   Wt Readings from Last 3 Encounters:  03/24/18 267 lb 9.6 oz (121.4 kg)  01/13/18 265 lb 3.2 oz (120.3 kg)  12/24/17 268 lb (121.6 kg)    General Examination:   BP 140/74 (BP  Location: Left Arm, Patient Position: Sitting, Cuff Size: Large)   Pulse 62   Ht  (1.854 m)   Wt 267 lb 9.6 oz (121.4 kg)   SpO2 92%   BMI 35.31 kg/m    Assessment/ Plan:  Hypogonadotropic Hypogonadism, 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  Taking 25 mg clomiphene 4 times a week now Although this had been effective in treatment over the last few months his testosterone level is declining and he is again symptomatic  Discussed that we will need to go back to AndroGel since clomiphene is not having any sustained benefit To start with 1 pump on each arm Discussed that we may need to have some prior authorization and not clear what his insurance is going to cover Follow-up in 2-3 months again  Impaired fasting glucose: He has not lost any weight but his glucose is 100 compared to 105 previously He is understanding the need for doing increase efforts to lose weight  Total visit time 15 minutes  Dustin Mckee 03/24/2018, 8:31 AM

## 2018-03-24 ENCOUNTER — Encounter: Payer: Self-pay | Admitting: Endocrinology

## 2018-03-24 ENCOUNTER — Ambulatory Visit (INDEPENDENT_AMBULATORY_CARE_PROVIDER_SITE_OTHER): Payer: BLUE CROSS/BLUE SHIELD | Admitting: Endocrinology

## 2018-03-24 ENCOUNTER — Telehealth: Payer: Self-pay

## 2018-03-24 VITALS — BP 140/74 | HR 62 | Ht 73.0 in | Wt 267.6 lb

## 2018-03-24 DIAGNOSIS — R5383 Other fatigue: Secondary | ICD-10-CM | POA: Diagnosis not present

## 2018-03-24 DIAGNOSIS — E230 Hypopituitarism: Secondary | ICD-10-CM | POA: Diagnosis not present

## 2018-03-24 MED ORDER — TESTOSTERONE 20.25 MG/ACT (1.62%) TD GEL: TRANSDERMAL | 2 refills | Status: DC

## 2018-03-24 NOTE — Telephone Encounter (Signed)
Received fax from [facility] stating that patient has been approved for Testosterone 20.25mg /act (1.62%) TD GEL.  Approval is good from 02/22/2018-03/24/2019.

## 2018-05-05 DIAGNOSIS — G4733 Obstructive sleep apnea (adult) (pediatric): Secondary | ICD-10-CM | POA: Diagnosis not present

## 2018-05-20 ENCOUNTER — Other Ambulatory Visit: Payer: Self-pay

## 2018-05-20 ENCOUNTER — Encounter: Payer: Self-pay | Admitting: Emergency Medicine

## 2018-05-20 ENCOUNTER — Ambulatory Visit: Payer: BLUE CROSS/BLUE SHIELD | Admitting: Emergency Medicine

## 2018-05-20 VITALS — BP 131/78 | HR 93 | Temp 99.1°F | Resp 16 | Ht 74.0 in | Wt 270.4 lb

## 2018-05-20 DIAGNOSIS — R350 Frequency of micturition: Secondary | ICD-10-CM | POA: Diagnosis not present

## 2018-05-20 DIAGNOSIS — N401 Enlarged prostate with lower urinary tract symptoms: Secondary | ICD-10-CM | POA: Diagnosis not present

## 2018-05-20 DIAGNOSIS — N390 Urinary tract infection, site not specified: Secondary | ICD-10-CM

## 2018-05-20 DIAGNOSIS — R300 Dysuria: Secondary | ICD-10-CM | POA: Insufficient documentation

## 2018-05-20 LAB — POCT URINALYSIS DIP (MANUAL ENTRY)
Bilirubin, UA: NEGATIVE
Glucose, UA: NEGATIVE mg/dL
Ketones, POC UA: NEGATIVE mg/dL
Nitrite, UA: POSITIVE — AB
Protein Ur, POC: 30 mg/dL — AB
Spec Grav, UA: 1.015 (ref 1.010–1.025)
Urobilinogen, UA: 0.2 E.U./dL
pH, UA: 5.5 (ref 5.0–8.0)

## 2018-05-20 MED ORDER — CIPROFLOXACIN HCL 500 MG PO TABS: 500.0000 mg | ORAL_TABLET | Freq: Two times a day (BID) | ORAL | 0 refills | Status: AC

## 2018-05-20 MED ORDER — TAMSULOSIN HCL 0.4 MG PO CAPS: 0.8000 mg | ORAL_CAPSULE | Freq: Every day | ORAL | 3 refills | Status: AC

## 2018-05-20 NOTE — Patient Instructions (Addendum)
     IF you received an x-ray today, you will receive an invoice from [facility]. Please contact [facility] at [phone] with questions or concerns regarding your invoice.   IF you received labwork today, you will receive an invoice from [facility]. Please contact [facility] at [phone] with questions or concerns regarding your invoice.   Our billing staff will not be able to assist you with questions regarding bills from these companies.  You will be contacted with the lab results as soon as they are available. The fastest way to get your results is to activate your My Chart account. Instructions are located on the last page of this paperwork. If you have not heard from us regarding the results in 2 weeks, please contact this office.     Urinary Tract Infection, Adult A urinary tract infection (UTI) is an infection of any part of the urinary tract. The urinary tract includes the:  Kidneys.  Ureters.  Bladder.  Urethra.  These organs make, store, and get rid of pee (urine) in the body. Follow these instructions at home:  Take over-the-counter and prescription medicines only as told by your doctor.  If you were prescribed an antibiotic medicine, take it as told by your doctor. Do not stop taking the antibiotic even if you start to feel better.  Avoid the following drinks: ? Alcohol. ? Caffeine. ? Tea. ? Carbonated drinks.  Drink enough fluid to keep your pee clear or pale yellow.  Keep all follow-up visits as told by your doctor. This is important.  Make sure to: ? Empty your bladder often and completely. Do not to hold pee for long periods of time. ? Empty your bladder before and after sex. ? Wipe from front to back after a bowel movement if you are male. Use each tissue one time when you wipe. Contact a doctor if:  You have back pain.  You have a fever.  You feel sick to your stomach (nauseous).  You throw up (vomit).  Your symptoms do  not get better after 3 days.  Your symptoms go away and then come back. Get help right away if:  You have very bad back pain.  You have very bad lower belly (abdominal) pain.  You are throwing up and cannot keep down any medicines or water. This information is not intended to replace advice given to you by your health care provider. Make sure you discuss any questions you have with your health care provider. Document Released: 04/15/2008 Document Revised: 04/04/2016 Document Reviewed: 09/18/2015 Elsevier Interactive Patient Education  2018 Elsevier Inc.  

## 2018-05-20 NOTE — Progress Notes (Signed)
Dustin BasquesGregory A Mckee 62 y.o.   Chief Complaint  Patient presents with  . Urinary Tract Infection    per patient slight started 05/19/2018 pm  . Urinary Frequency    HISTORY OF PRESENT ILLNESS: This is a 62 y.o. male complaining of urinary frequency and painful urination that started last night.  Had a similar episode 5 to 6 years ago.  Has a history of BPH and takes Flomax but prescription ran out couple weeks ago and has been off it.  Denies fever or chills.  Able to eat and drink.  Denies nausea or vomiting.  Denies flank pain.  Denies hematuria.  No other significant symptoms.  HPI   Prior to Admission medications   Medication Sig Start Date End Date Taking? Authorizing Provider  hydrochlorothiazide (MICROZIDE) 12.5 MG capsule Take 1 capsule (12.5 mg total) by mouth daily. 09/08/17  Yes Shade FloodGreene, Jeffrey R, MD  rosuvastatin (CRESTOR) 10 MG tablet Take 1 tablet (10 mg total) by mouth daily. 09/08/17  Yes Shade FloodGreene, Jeffrey R, MD  tamsulosin (FLOMAX) 0.4 MG CAPS capsule Take 2 capsules (0.8 mg total) by mouth daily. 09/08/17  Yes Shade FloodGreene, Jeffrey R, MD  Testosterone 20.25 MG/ACT (1.62%) GEL Apply 1 pump on each shoulder once daily and allow to dry 03/24/18  Yes Reather LittlerKumar, Ajay, MD  clomiPHENE (CLOMID) 50 MG tablet Half tablet 4 times a week Patient not taking: Reported on 05/20/2018 12/24/17   Reather LittlerKumar, Ajay, MD    Allergies  Allergen Reactions  . Iodine Hives    Patient Active Problem List   Diagnosis Date Noted  . Obstructive sleep apnea treated with continuous positive airway pressure (CPAP) 01/13/2018  . Sinusitis chronic, frontal 10/15/2017  . Excessive daytime sleepiness 10/15/2017  . Seasonal allergic rhinitis due to pollen 10/15/2017  . Hyperlipidemia 09/05/2014  . Unspecified vitamin D deficiency 06/14/2014  . BPH (benign prostatic hypertrophy) with urinary obstruction 06/14/2014  . GERD 01/23/2009  . HYPERLIPIDEMIA 12/14/2008  . SLEEP APNEA 11/29/2008    Past Medical History:    Diagnosis Date  . Allergy   . Hyperlipidemia   . Hypertension   . Sleep apnea    CPAP    Past Surgical History:  Procedure Laterality Date  . APPENDECTOMY    . TOOTH EXTRACTION    . VASECTOMY      Social History   Socioeconomic History  . Marital status: Married    Spouse name: Gavin PoundDeborah  . Number of children: Not on file  . Years of education: Not on file  . Highest education level: Not on file  Occupational History  . Not on file  Social Needs  . Financial resource strain: Not on file  . Food insecurity:    Worry: Not on file    Inability: Not on file  . Transportation needs:    Medical: Not on file    Non-medical: Not on file  Tobacco Use  . Smoking status: Never Smoker  . Smokeless tobacco: Never Used  Substance and Sexual Activity  . Alcohol use: No  . Drug use: No  . Sexual activity: Yes    Birth control/protection: None  Lifestyle  . Physical activity:    Days per week: Not on file    Minutes per session: Not on file  . Stress: Not on file  Relationships  . Social connections:    Talks on phone: Not on file    Gets together: Not on file    Attends religious service: Not on file  Active member of club or organization: Not on file    Attends meetings of clubs or organizations: Not on file    Relationship status: Not on file  . Intimate partner violence:    Fear of current or ex partner: Not on file    Emotionally abused: Not on file    Physically abused: Not on file    Forced sexual activity: Not on file  Other Topics Concern  . Not on file  Social History Narrative   Drinks 1-2 cups caffeine daily.    Family History  Problem Relation Age of Onset  . Heart disease Mother   . Kidney disease Mother   . Heart disease Father        defibrillator/CHF.  . Diabetes Father   . Heart disease Sister        CHF  . Diabetes Sister   . Heart disease Sister        CHF     Review of Systems  Constitutional: Negative.  Negative for chills, fever  and malaise/fatigue.  HENT: Negative.   Eyes: Negative.   Respiratory: Negative.  Negative for cough and wheezing.   Cardiovascular: Negative.  Negative for chest pain and palpitations.  Gastrointestinal: Negative.  Negative for abdominal pain, diarrhea, nausea and vomiting.  Genitourinary: Positive for dysuria and frequency. Negative for flank pain and hematuria.  Musculoskeletal: Negative.   Skin: Negative.  Negative for rash.  Neurological: Negative.  Negative for dizziness and headaches.  Endo/Heme/Allergies: Negative.   All other systems reviewed and are negative.   Vitals:   05/20/18 1510  BP: 131/78  Pulse: 93  Resp: 16  Temp: 99.1 F (37.3 C)  SpO2: 95%    Physical Exam  Constitutional: He is oriented to person, place, and time. He appears well-developed and well-nourished.  HENT:  Head: Normocephalic and atraumatic.  Nose: Nose normal.  Mouth/Throat: Oropharynx is clear and moist.  Eyes: Pupils are equal, round, and reactive to light. Conjunctivae are normal.  Neck: Normal range of motion. Neck supple.  Cardiovascular: Normal rate and regular rhythm.  Pulmonary/Chest: Effort normal and breath sounds normal.  Abdominal: Soft. Bowel sounds are normal. He exhibits no distension. There is no tenderness.  Musculoskeletal: Normal range of motion. He exhibits no edema or tenderness.  Neurological: He is alert and oriented to person, place, and time. No sensory deficit. He exhibits normal muscle tone.  Skin: Skin is warm and dry. Capillary refill takes less than 2 seconds. No rash noted.  Psychiatric: He has a normal mood and affect. His behavior is normal.  Vitals reviewed.    Results for orders placed or performed in visit on 05/20/18 (from the past 24 hour(s))  POCT urinalysis dipstick     Status: Abnormal   Collection Time: 05/20/18  3:27 PM  Result Value Ref Range   Color, UA yellow yellow   Clarity, UA cloudy (A) clear   Glucose, UA negative negative mg/dL    Bilirubin, UA negative negative   Ketones, POC UA negative negative mg/dL   Spec Grav, UA 1.610 9.604 - 1.025   Blood, UA large (A) negative   pH, UA 5.5 5.0 - 8.0   Protein Ur, POC =30 (A) negative mg/dL   Urobilinogen, UA 0.2 0.2 or 1.0 E.U./dL   Nitrite, UA Positive (A) Negative   Leukocytes, UA Small (1+) (A) Negative     ASSESSMENT & PLAN: Stephane was seen today for urinary tract infection and urinary frequency.  Diagnoses and all  orders for this visit:  Urinary frequency -     POCT urinalysis dipstick -     Urine Culture  Dysuria  Acute UTI -     ciprofloxacin (CIPRO) 500 MG tablet; Take 1 tablet (500 mg total) by mouth 2 (two) times daily for 7 days.  Benign prostatic hyperplasia with urinary frequency -     tamsulosin (FLOMAX) 0.4 MG CAPS capsule; Take 2 capsules (0.8 mg total) by mouth daily.    Patient Instructions       IF you received an x-ray today, you will receive an invoice from [facility]. Please contact [facility] at [phone] with questions or concerns regarding your invoice.   IF you received labwork today, you will receive an invoice from [facility]. Please contact [facility] at [phone] with questions or concerns regarding your invoice.   Our billing staff will not be able to assist you with questions regarding bills from these companies.  You will be contacted with the lab results as soon as they are available. The fastest way to get your results is to activate your My Chart account. Instructions are located on the last page of this paperwork. If you have not heard from [place] regarding the results in 2 weeks, please contact this office.     Urinary Tract Infection, Adult A urinary tract infection (UTI) is an infection of any part of the urinary tract. The urinary tract includes the:  Kidneys.  Ureters.  Bladder.  Urethra.  These organs make, store, and get rid of pee (urine) in the body. Follow these instructions at  home:  Take over-the-counter and prescription medicines only as told by your doctor.  If you were prescribed an antibiotic medicine, take it as told by your doctor. Do not stop taking the antibiotic even if you start to feel better.  Avoid the following drinks: ? Alcohol. ? Caffeine. ? Tea. ? Carbonated drinks.  Drink enough fluid to keep your pee clear or pale yellow.  Keep all follow-up visits as told by your doctor. This is important.  Make sure to: ? Empty your bladder often and completely. Do not to hold pee for long periods of time. ? Empty your bladder before and after sex. ? Wipe from front to back after a bowel movement if you are male. Use each tissue one time when you wipe. Contact a doctor if:  You have back pain.  You have a fever.  You feel sick to your stomach (nauseous).  You throw up (vomit).  Your symptoms do not get better after 3 days.  Your symptoms go away and then come back. Get help right away if:  You have very bad back pain.  You have very bad lower belly (abdominal) pain.  You are throwing up and cannot keep down any medicines or water. This information is not intended to replace advice given to you by your health care provider. Make sure you discuss any questions you have with your health care provider. Document Released: 04/15/2008 Document Revised: 04/04/2016 Document Reviewed: 09/18/2015 Elsevier Interactive Patient Education  2018 Elsevier Inc.      Edwina Barth, MD [facility]

## 2018-05-22 ENCOUNTER — Ambulatory Visit: Payer: Self-pay

## 2018-05-22 ENCOUNTER — Encounter: Payer: Self-pay | Admitting: Emergency Medicine

## 2018-05-22 ENCOUNTER — Ambulatory Visit: Payer: BLUE CROSS/BLUE SHIELD | Admitting: Emergency Medicine

## 2018-05-22 ENCOUNTER — Other Ambulatory Visit: Payer: Self-pay

## 2018-05-22 VITALS — BP 110/70 | HR 92 | Temp 98.7°F | Resp 16 | Wt 267.2 lb

## 2018-05-22 DIAGNOSIS — N390 Urinary tract infection, site not specified: Secondary | ICD-10-CM

## 2018-05-22 DIAGNOSIS — R319 Hematuria, unspecified: Secondary | ICD-10-CM | POA: Diagnosis not present

## 2018-05-22 LAB — URINE CULTURE

## 2018-05-22 NOTE — Patient Instructions (Addendum)
     IF you received an x-ray today, you will receive an invoice from [facility]. Please contact [facility] at [phone] with questions or concerns regarding your invoice.   IF you received labwork today, you will receive an invoice from [facility]. Please contact [facility] at [phone] with questions or concerns regarding your invoice.   Our billing staff will not be able to assist you with questions regarding bills from these companies.  You will be contacted with the lab results as soon as they are available. The fastest way to get your results is to activate your My Chart account. Instructions are located on the last page of this paperwork. If you have not heard from us regarding the results in 2 weeks, please contact this office.      Hematuria, Adult Hematuria is blood in your urine. It can be caused by a bladder infection, kidney infection, prostate infection, kidney stone, or cancer of your urinary tract. Infections can usually be treated with medicine, and a kidney stone usually will pass through your urine. If neither of these is the cause of your hematuria, further workup to find out the reason may be needed. It is very important that you tell your health care provider about any blood you see in your urine, even if the blood stops without treatment or happens without causing pain. Blood in your urine that happens and then stops and then happens again can be a symptom of a very serious condition. Also, pain is not a symptom in the initial stages of many urinary cancers. Follow these instructions at home:  Drink lots of fluid, 3-4 quarts a day. If you have been diagnosed with an infection, cranberry juice is especially recommended, in addition to large amounts of water.  Avoid caffeine, tea, and carbonated beverages because they tend to irritate the bladder.  Avoid alcohol because it may irritate the prostate.  Take all medicines as directed by your health  care provider.  If you were prescribed an antibiotic medicine, finish it all even if you start to feel better.  If you have been diagnosed with a kidney stone, follow your health care provider's instructions regarding straining your urine to catch the stone.  Empty your bladder often. Avoid holding urine for long periods of time.  After a bowel movement, women should cleanse front to back. Use each tissue only once.  Empty your bladder before and after sexual intercourse if you are a male. Contact a health care provider if:  You develop back pain.  You have a fever.  You have a feeling of sickness in your stomach (nausea) or vomiting.  Your symptoms are not better in 3 days. Return sooner if you are getting worse. Get help right away if:  You develop severe vomiting and are unable to keep the medicine down.  You develop severe back or abdominal pain despite taking your medicines.  You begin passing a large amount of blood or clots in your urine.  You feel extremely weak or faint, or you pass out. This information is not intended to replace advice given to you by your health care provider. Make sure you discuss any questions you have with your health care provider. Document Released: 10/28/2005 Document Revised: 04/04/2016 Document Reviewed: 06/28/2013 Elsevier Interactive Patient Education  2017 Elsevier Inc.  

## 2018-05-22 NOTE — Progress Notes (Signed)
Dustin Mckee 62 y.o.   Chief Complaint  Patient presents with  . Hematuria    per patient 05/21/18 pm and clots    HISTORY OF PRESENT ILLNESS: This is a 62 y.o. male seen by me on 05/20/2018 with a UTI.  Started on Cipro 500 mg twice a day.  Urine culture grew E. coli sensitive to all antibiotics.  No fever or chills.  No nausea or vomiting.  UTI symptoms much improved however he noticed hematuria yesterday.  Passing clots.  No urinary flow obstruction.  Started drinking fluids and hematuria improved in the past couple of hours.  No other significant symptoms.  Takes daily aspirin and occasionally takes Advil.  Advised to stop those and take Tylenol as needed. Results for orders placed or performed in visit on 05/20/18 (from the past 72 hour(s))  POCT urinalysis dipstick     Status: Abnormal   Collection Time: 05/20/18  3:27 PM  Result Value Ref Range   Color, UA yellow yellow   Clarity, UA cloudy (A) clear   Glucose, UA negative negative mg/dL   Bilirubin, UA negative negative   Ketones, POC UA negative negative mg/dL   Spec Grav, UA 2.956 2.130 - 1.025   Blood, UA large (A) negative   pH, UA 5.5 5.0 - 8.0   Protein Ur, POC =30 (A) negative mg/dL   Urobilinogen, UA 0.2 0.2 or 1.0 E.U./dL   Nitrite, UA Positive (A) Negative   Leukocytes, UA Small (1+) (A) Negative  Urine Culture     Status: Abnormal   Collection Time: 05/20/18  3:33 PM  Result Value Ref Range   Urine Culture, Routine Final report (A)    Organism ID, Bacteria Escherichia coli (A)     Comment: Greater than 100,000 colony forming units per mL Cefazolin <=4 ug/mL Cefazolin with an MIC <=16 predicts susceptibility to the oral agents cefaclor, cefdinir, cefpodoxime, cefprozil, cefuroxime, cephalexin, and loracarbef when used for therapy of uncomplicated urinary tract infections due to E. coli, Klebsiella pneumoniae, and Proteus mirabilis.    Antimicrobial Susceptibility Comment     Comment:       ** S =  Susceptible; I = Intermediate; R = Resistant **                    P = Positive; N = Negative             MICS are expressed in micrograms per mL    Antibiotic                 RSLT#1    RSLT#2    RSLT#3    RSLT#4 Amoxicillin/Clavulanic Acid    S Ampicillin                     S Cefepime                       S Ceftriaxone                    S Cefuroxime                     S Ciprofloxacin                  S Ertapenem                      S Gentamicin  S Imipenem                       S Levofloxacin                   S Meropenem                      S Nitrofurantoin                 S Piperacillin/Tazobactam        S Tetracycline                   S Tobramycin                     S Trimethoprim/Sulfa             S     HPI   Prior to Admission medications   Medication Sig Start Date End Date Taking? Authorizing Provider  ciprofloxacin (CIPRO) 500 MG tablet Take 1 tablet (500 mg total) by mouth 2 (two) times daily for 7 days. 05/20/18 05/27/18 Yes Taneshia Lorence, Eilleen Kempf, MD  hydrochlorothiazide (MICROZIDE) 12.5 MG capsule Take 1 capsule (12.5 mg total) by mouth daily. 09/08/17  Yes Shade Flood, MD  rosuvastatin (CRESTOR) 10 MG tablet Take 1 tablet (10 mg total) by mouth daily. 09/08/17  Yes Shade Flood, MD  tamsulosin (FLOMAX) 0.4 MG CAPS capsule Take 2 capsules (0.8 mg total) by mouth daily. 05/20/18 06/19/18 Yes SagardiaEilleen Kempf, MD  Testosterone 20.25 MG/ACT (1.62%) GEL Apply 1 pump on each shoulder once daily and allow to dry 03/24/18  Yes Reather Littler, MD  clomiPHENE (CLOMID) 50 MG tablet Half tablet 4 times a week Patient not taking: Reported on 05/20/2018 12/24/17   Reather Littler, MD    Allergies  Allergen Reactions  . Iodine Hives    Patient Active Problem List   Diagnosis Date Noted  . Dysuria 05/20/2018  . Acute UTI 05/20/2018  . Urinary frequency 05/20/2018  . Obstructive sleep apnea treated with continuous positive airway pressure (CPAP)  01/13/2018  . Sinusitis chronic, frontal 10/15/2017  . Excessive daytime sleepiness 10/15/2017  . Seasonal allergic rhinitis due to pollen 10/15/2017  . Hyperlipidemia 09/05/2014  . Unspecified vitamin D deficiency 06/14/2014  . Benign prostatic hyperplasia with urinary frequency 06/14/2014  . GERD 01/23/2009  . HYPERLIPIDEMIA 12/14/2008  . SLEEP APNEA 11/29/2008    Past Medical History:  Diagnosis Date  . Allergy   . Hyperlipidemia   . Hypertension   . Sleep apnea    CPAP    Past Surgical History:  Procedure Laterality Date  . APPENDECTOMY    . TOOTH EXTRACTION    . VASECTOMY      Social History   Socioeconomic History  . Marital status: Married    Spouse name: Dustin Mckee  . Number of children: Not on file  . Years of education: Not on file  . Highest education level: Not on file  Occupational History  . Not on file  Social Needs  . Financial resource strain: Not on file  . Food insecurity:    Worry: Not on file    Inability: Not on file  . Transportation needs:    Medical: Not on file    Non-medical: Not on file  Tobacco Use  . Smoking status: Never Smoker  . Smokeless tobacco: Never Used  Substance and Sexual Activity  . Alcohol use: No  .  Drug use: No  . Sexual activity: Yes    Birth control/protection: None  Lifestyle  . Physical activity:    Days per week: Not on file    Minutes per session: Not on file  . Stress: Not on file  Relationships  . Social connections:    Talks on phone: Not on file    Gets together: Not on file    Attends religious service: Not on file    Active member of club or organization: Not on file    Attends meetings of clubs or organizations: Not on file    Relationship status: Not on file  . Intimate partner violence:    Fear of current or ex partner: Not on file    Emotionally abused: Not on file    Physically abused: Not on file    Forced sexual activity: Not on file  Other Topics Concern  . Not on file  Social  History Narrative   Drinks 1-2 cups caffeine daily.    Family History  Problem Relation Age of Onset  . Heart disease Mother   . Kidney disease Mother   . Heart disease Father        defibrillator/CHF.  . Diabetes Father   . Heart disease Sister        CHF  . Diabetes Sister   . Heart disease Sister        CHF     Review of Systems  Constitutional: Negative.  Negative for chills and fever.  HENT: Negative.  Negative for sore throat.   Eyes: Negative.   Respiratory: Negative.  Negative for shortness of breath.   Gastrointestinal: Negative.  Negative for abdominal pain, blood in stool, diarrhea, nausea and vomiting.  Genitourinary: Positive for hematuria. Negative for dysuria.  Skin: Negative.  Negative for rash.  Neurological: Negative for dizziness and headaches.  Endo/Heme/Allergies: Negative.   All other systems reviewed and are negative.   Vitals:   05/22/18 1533  BP: 110/70  Pulse: 92  Resp: 16  Temp: 98.7 F (37.1 C)  SpO2: 98%    Physical Exam  Constitutional: He is oriented to person, place, and time. He appears well-developed and well-nourished.  HENT:  Head: Normocephalic and atraumatic.  Eyes: Pupils are equal, round, and reactive to light. EOM are normal.  Neck: Normal range of motion. Neck supple.  Cardiovascular: Normal rate.  Pulmonary/Chest: Effort normal.  Musculoskeletal: Normal range of motion.  Neurological: He is alert and oriented to person, place, and time.  Skin: Skin is warm and dry. Capillary refill takes less than 2 seconds.  Psychiatric: He has a normal mood and affect. His behavior is normal.  Vitals reviewed.    ASSESSMENT & PLAN: Dustin Mckee was seen today for hematuria.  Diagnoses and all orders for this visit:  Hematuria, unspecified type -     Ambulatory referral to Urology  Acute UTI -     Ambulatory referral to Urology    Patient Instructions       IF you received an x-ray today, you will receive an invoice  from [facility]. Please contact [facility] at [phone] with questions or concerns regarding your invoice.   IF you received labwork today, you will receive an invoice from [facility]. Please contact [facility] at [phone] with questions or concerns regarding your invoice.   Our billing staff will not be able to assist you with questions regarding bills from these companies.  You will be contacted with the lab results as soon  as they are available. The fastest way to get your results is to activate your My Chart account. Instructions are located on the last page of this paperwork. If you have not heard from [place] regarding the results in 2 weeks, please contact this office.     Hematuria, Adult Hematuria is blood in your urine. It can be caused by a bladder infection, kidney infection, prostate infection, kidney stone, or cancer of your urinary tract. Infections can usually be treated with medicine, and a kidney stone usually will pass through your urine. If neither of these is the cause of your hematuria, further workup to find out the reason may be needed. It is very important that you tell your health care provider about any blood you see in your urine, even if the blood stops without treatment or happens without causing pain. Blood in your urine that happens and then stops and then happens again can be a symptom of a very serious condition. Also, pain is not a symptom in the initial stages of many urinary cancers. Follow these instructions at home:  Drink lots of fluid, 3-4 quarts a day. If you have been diagnosed with an infection, cranberry juice is especially recommended, in addition to large amounts of water.  Avoid caffeine, tea, and carbonated beverages because they tend to irritate the bladder.  Avoid alcohol because it may irritate the prostate.  Take all medicines as directed by your health care provider.  If you were prescribed an antibiotic medicine, finish  it all even if you start to feel better.  If you have been diagnosed with a kidney stone, follow your health care provider's instructions regarding straining your urine to catch the stone.  Empty your bladder often. Avoid holding urine for long periods of time.  After a bowel movement, women should cleanse front to back. Use each tissue only once.  Empty your bladder before and after sexual intercourse if you are a male. Contact a health care provider if:  You develop back pain.  You have a fever.  You have a feeling of sickness in your stomach (nausea) or vomiting.  Your symptoms are not better in 3 days. Return sooner if you are getting worse. Get help right away if:  You develop severe vomiting and are unable to keep the medicine down.  You develop severe back or abdominal pain despite taking your medicines.  You begin passing a large amount of blood or clots in your urine.  You feel extremely weak or faint, or you pass out. This information is not intended to replace advice given to you by your health care provider. Make sure you discuss any questions you have with your health care provider. Document Released: 10/28/2005 Document Revised: 04/04/2016 Document Reviewed: 06/28/2013 Elsevier Interactive Patient Education  2017 Elsevier Inc.      Edwina Barth, MD [facility]

## 2018-05-22 NOTE — Telephone Encounter (Signed)
Pt. Reports he saw Dr. Alvy BimlerSagardia Wednesday and was treated for a UTI with Cipro. Yesterday started seeing blood and blood clots with voiding. Feels like he is not emptying his bladder completely. Continues to have pain with urination. Appointment made for today. Instructed if symptoms worsen to call back. Verbalizes understanding.  Reason for Disposition . Blood in urine  (Exception: could be normal menstrual bleeding)  Answer Assessment - Initial Assessment Questions 1. COLOR of URINE: "Describe the color of the urine."  (e.g., tea-colored, pink, red, blood clots, bloody)     Bright red and blood clots 2. ONSET: "When did the bleeding start?"      Yesterday 3. EPISODES: "How many times has there been blood in the urine?" or "How many times today?"     Every time he voids 4. PAIN with URINATION: "Is there any pain with passing your urine?" If so, ask: "How bad is the pain?"  (Scale 1-10; or mild, moderate, severe)    - MILD - complains slightly about urination hurting    - MODERATE - interferes with normal activities      - SEVERE - excruciating, unwilling or unable to urinate because of the pain      Moderates 5. FEVER: "Do you have a fever?" If so, ask: "What is your temperature, how was it measured, and when did it start?"     No 6. ASSOCIATED SYMPTOMS: "Are you passing urine more frequently than usual?"     Not emptying bladder well 7. OTHER SYMPTOMS: "Do you have any other symptoms?" (e.g., back/flank pain, abdominal pain, vomiting)     No 8. PREGNANCY: "Is there any chance you are pregnant?" "When was your last menstrual period?"     n/a  Protocols used: URINE - BLOOD IN-A-AH

## 2018-06-04 DIAGNOSIS — G4733 Obstructive sleep apnea (adult) (pediatric): Secondary | ICD-10-CM | POA: Diagnosis not present

## 2018-07-05 DIAGNOSIS — G4733 Obstructive sleep apnea (adult) (pediatric): Secondary | ICD-10-CM | POA: Diagnosis not present

## 2018-07-14 ENCOUNTER — Encounter: Payer: Self-pay | Admitting: Nurse Practitioner

## 2018-07-15 NOTE — Progress Notes (Signed)
[facility]  PATIENT: Dustin Mckee DOB: [birth year]   REASON FOR VISIT: Follow-up for obstructive sleep apnea with  CPAP compliance HISTORY FROM: Patient    HISTORY OF PRESENT ILLNESS:UPDATE 9/5/2019CM Dustin Mckee, 62 year old male returns for follow-up with history of obstructive sleep apnea here for CPAP compliance.  He is doing well with his CPAP machine and has no complaints.  CPAP data dated 06/15/2018-07/14/2018 shows compliance greater than 4 hours at 100%.  Average usage 6 hours 36 minutes.  Set pressure 7 cm.  EPR level 3.  AHI 2.2 leak 95th percentile 1.9 ESS 5.  He returns for reevaluation   UPDATE 3/5/2019CM Dustin Mckee, 62 year old male returns for follow-up with history of obstructive sleep apnea on CPAP.  He denies any problems with his machine.  Compliance data dated 12/14/2017-01/12/2018 shows compliance greater than 4 hours at 100%.  Average usage 6 hours 58 minutes set pressure 7 cm.  EPR 3 AHI 2 Leaks 95th percentile 1.4.  ESS 1.  He returns for reevaluation  10/24/18CDThe pleasure of seeing Dustin Mckee today, and meanwhile 62 year old African-American gentleman whom I last saw in 2016. Today's 09/03/2017 and the patient had not followed up on his previous sleep study performed on 10/26/2015 at [facility] sleep. The patient's AHI of a home sleep test was 28.6 and his oxygen nadir at 82%, he definitely qualified for a new CPAP machine but he did not return for a CPAP titration. The patient is insured through the [facility] health medical group. I will invite him for a CPAP titration ASAP.   Dustin Mckee is a 62 y.o. male , seen here as a referral from Dr. Nichola Sizer, MD  / Deliah Boston, PA at [facility] for a sleep consultation.  Chief complaint according to patient :" I don't have the same benefit by using CPAP".  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  Mr. Waddington reports that in the year 2004 he was diagnosed with sleep apnea at [facility] sleep center and a CPAP machine was prescribed. About 6 years later around 2010 there was an adjustment made to the pressures. Since then and in between he did not require any changes and he has used his CPAP faithfully ever since. Since 2010 he has gained weight and that may be a factor in his resurgence of sleepiness. He feels more fatigued unable to multitask or focus as well. A download was obtained today in office showing that his CPAP has been used on average 6 hours and 47 minutes per day and that he has is to 30 out of 30 days. His compliance is 96% which is excellent. Unfortunately he was prescribed an escape model which does not allow access to therapeutic data such as residual AHI. He is using and nasal pillow model as interface.  Sleep habits are as follows: Patient works daytime job, his usual bedtime is between 10 and 11:00 PM, he falls asleep promptly with his CPAP. He used to have nocturia up to 3 or 4 times at night and to before he started on Flomax now he will go to the bathroom perhaps once. He has noted that he doesn't seem to dream as much anymore. He sleeps otherwise well through the night , on his left side, until he rises in the morning at 6 AM. He wakes up spontaneously without  alarm. The bedroom is cool, quiet and dark. He shares the bedroom with his spouse. He will take a couple of coffee in the morning and commute to work. His workplace does not have access to natural daylight. He has found himself during the day sometimes for a couple of seconds to microsleep. He does not schedule any naps. After work around 6 PM he often feels that he is tired enough to take a nap and sometimes he will allow himself to nap for 30 minutes, but this is rare.   REVIEW OF SYSTEMS: Full 14 system review of systems performed and notable only for those  listed, all others are neg:  Constitutional: neg  Cardiovascular: neg Ear/Nose/Throat: neg  Skin: neg Eyes: neg Respiratory: neg Gastroitestinal: neg  Hematology/Lymphatic: neg  Endocrine: neg Musculoskeletal:neg Allergy/Immunology: neg Neurological: neg Psychiatric: neg Sleep : Obstructive sleep apnea with CPAP   ALLERGIES: Allergies  Allergen Reactions  . Iodine Hives    HOME MEDICATIONS: Outpatient Medications Prior to Visit  Medication Sig Dispense Refill  . hydrochlorothiazide (MICROZIDE) 12.5 MG capsule Take 1 capsule (12.5 mg total) by mouth daily. 90 capsule 3  . rosuvastatin (CRESTOR) 10 MG tablet Take 1 tablet (10 mg total) by mouth daily. 90 tablet 2  . Testosterone 20.25 MG/ACT (1.62%) GEL Apply 1 pump on each shoulder once daily and allow to dry 75 g 2  . clomiPHENE (CLOMID) 50 MG tablet Half tablet 4 times a week (Patient not taking: Reported on 05/20/2018) 12 tablet 5   No facility-administered medications prior to visit.     PAST MEDICAL HISTORY: Past Medical History:  Diagnosis Date  . Allergy   . Hyperlipidemia   . Hypertension   . Sleep apnea    CPAP    PAST SURGICAL HISTORY: Past Surgical History:  Procedure Laterality Date  . APPENDECTOMY    . TOOTH EXTRACTION    . VASECTOMY      FAMILY HISTORY: Family History  Problem Relation Age of Onset  . Heart disease Mother   . Kidney disease Mother   . Heart disease Father        defibrillator/CHF.  . Diabetes Father   . Heart disease Sister        CHF  . Diabetes Sister   . Heart disease Sister        CHF    SOCIAL HISTORY: Social History   Socioeconomic History  . Marital status: Married    Spouse name: Dustin Mckee  . Number of children: Not on file  . Years of education: Not on file  . Highest education level: Not on file  Occupational History  . Not on file  Social Needs  . Financial resource strain: Not on file  . Food insecurity:    Worry: Not on file    Inability: Not on  file  . Transportation needs:    Medical: Not on file    Non-medical: Not on file  Tobacco Use  . Smoking status: Never Smoker  . Smokeless tobacco: Never Used  Substance and Sexual Activity  . Alcohol use: No  . Drug use: No  . Sexual activity: Yes    Birth control/protection: None  Lifestyle  . Physical activity:    Days per week: Not on file    Minutes per session: Not on file  . Stress: Not on file  Relationships  . Social connections:    Talks on phone: Not on file    Gets together: Not on file  Attends religious service: Not on file    Active member of club or organization: Not on file    Attends meetings of clubs or organizations: Not on file    Relationship status: Not on file  . Intimate partner violence:    Fear of current or ex partner: Not on file    Emotionally abused: Not on file    Physically abused: Not on file    Forced sexual activity: Not on file  Other Topics Concern  . Not on file  Social History Narrative   Drinks 1-2 cups caffeine daily.     PHYSICAL EXAM  Vitals:   07/16/18 1307  BP: 128/75  Pulse: 91  Weight: 275 lb 3.2 oz (124.8 kg)  Height: 6\' 2"  (1.88 m)   Body mass index is 35.33 kg/m.  Generalized: Well developed, obese male in no acute distress  Head: normocephalic and atraumatic,. Oropharynx benign mallopatti 3 Neck: Supple, circumference 18.5 Lungs clear Musculoskeletal: No deformity  Skin no edema or rash Neurological examination   Mentation: Alert oriented to time, place, history taking. Attention span and concentration appropriate. Recent and remote memory intact.  Follows all commands speech and language fluent.   Cranial nerve II-XII: Pupils were equal round reactive to light extraocular movements were full, visual field were full on confrontational test. Facial sensation and strength were normal. hearing was intact to finger rubbing bilaterally. Uvula tongue midline. head turning and shoulder shrug were normal and  symmetric.Tongue protrusion into cheek strength was normal. Motor: normal bulk and tone, full strength in the BUE, BLE,  Sensory: normal and symmetric to light touch, Coordination: finger-nose-finger, heel-to-shin bilaterally, no dysmetria Gait and Station: Rising up from seated position without assistance, normal stance,  moderate stride, good arm swing, smooth turning, able to perform tiptoe, and heel walking without difficulty. Tandem gait is steady  DIAGNOSTIC DATA (LABS, IMAGING, TESTING) - I reviewed patient records, labs, notes, testing and imaging myself where available.  Lab Results  Component Value Date   WBC 5.3 11/08/2016   HGB 15.4 11/08/2016   HCT 44.7 11/08/2016   MCV 85 11/08/2016   PLT 223 11/08/2016      Component Value Date/Time   NA 143 09/08/2017 1556   K 4.4 09/08/2017 1556   CL 103 09/08/2017 1556   CO2 21 09/08/2017 1556   GLUCOSE 100 (H) 03/20/2018 0820   BUN 15 09/08/2017 1556   CREATININE 1.29 (H) 09/08/2017 1556   CREATININE 1.14 04/17/2016 0950   CALCIUM 9.3 09/08/2017 1556   PROT 7.1 09/08/2017 1556   ALBUMIN 4.6 09/08/2017 1556   AST 18 09/08/2017 1556   ALT 20 09/08/2017 1556   ALKPHOS 34 (L) 09/08/2017 1556   BILITOT 0.3 09/08/2017 1556   GFRNONAA 60 09/08/2017 1556   GFRNONAA 71 08/23/2015 1501   GFRAA 70 09/08/2017 1556   GFRAA 82 08/23/2015 1501   Lab Results  Component Value Date   CHOL 128 09/08/2017   HDL 46 09/08/2017   LDLCALC 61 09/08/2017   TRIG 103 09/08/2017   CHOLHDL 2.8 09/08/2017   Lab Results  Component Value Date   HGBA1C 5.7 03/20/2018    Lab Results  Component Value Date   TSH 2.820 09/08/2017      ASSESSMENT AND PLAN  62 y.o. year old male  has a past medical history of Allergy, Hyperlipidemia, Hypertension, and Sleep apnea. here to follow-up for initial CPAP compliance.Data dated 06/15/2018-07/14/2018 shows compliance greater than 4 hours at 100%.  Average usage  6 hours 36 minutes.  Set pressure 7 cm.  EPR  level 3.  AHI 2.2, leak 95th percentile 1.9 ESS 5.    PLAN: CPAP compliance 100% reviewed data with patient Continue same settings Follow-up yearly Portions of the above document her copy from prior visit for review purposes only Nilda Riggs, [facility], [facility], APRN  [facility] [address], [address] [place], [place] [address] [phone]

## 2018-07-16 ENCOUNTER — Encounter: Payer: Self-pay | Admitting: Nurse Practitioner

## 2018-07-16 ENCOUNTER — Ambulatory Visit: Payer: BLUE CROSS/BLUE SHIELD | Admitting: Nurse Practitioner

## 2018-07-16 VITALS — BP 128/75 | HR 91 | Ht 74.0 in | Wt 275.2 lb

## 2018-07-16 DIAGNOSIS — Z9989 Dependence on other enabling machines and devices: Secondary | ICD-10-CM | POA: Diagnosis not present

## 2018-07-16 DIAGNOSIS — G4733 Obstructive sleep apnea (adult) (pediatric): Secondary | ICD-10-CM | POA: Diagnosis not present

## 2018-07-16 NOTE — Progress Notes (Signed)
Fax confirmation received [employer] cpap supplies order. [phone]. sy

## 2018-07-16 NOTE — Patient Instructions (Signed)
CPAP compliance 100% reviewed data with patient Continue same settings Follow-up yearly

## 2018-09-14 DIAGNOSIS — F419 Anxiety disorder, unspecified: Secondary | ICD-10-CM | POA: Diagnosis not present

## 2018-09-14 DIAGNOSIS — F902 Attention-deficit hyperactivity disorder, combined type: Secondary | ICD-10-CM | POA: Diagnosis not present

## 2018-09-14 DIAGNOSIS — R41840 Attention and concentration deficit: Secondary | ICD-10-CM | POA: Diagnosis not present

## 2018-09-14 DIAGNOSIS — Z79899 Other long term (current) drug therapy: Secondary | ICD-10-CM | POA: Diagnosis not present

## 2018-10-05 DIAGNOSIS — G4733 Obstructive sleep apnea (adult) (pediatric): Secondary | ICD-10-CM | POA: Diagnosis not present

## 2018-10-12 ENCOUNTER — Encounter: Payer: Self-pay | Admitting: Family Medicine

## 2018-10-12 ENCOUNTER — Telehealth: Payer: Self-pay

## 2018-10-12 DIAGNOSIS — D123 Benign neoplasm of transverse colon: Secondary | ICD-10-CM | POA: Diagnosis not present

## 2018-10-12 DIAGNOSIS — N401 Enlarged prostate with lower urinary tract symptoms: Secondary | ICD-10-CM

## 2018-10-12 DIAGNOSIS — Z125 Encounter for screening for malignant neoplasm of prostate: Secondary | ICD-10-CM

## 2018-10-12 DIAGNOSIS — K621 Rectal polyp: Secondary | ICD-10-CM | POA: Diagnosis not present

## 2018-10-12 DIAGNOSIS — Z86010 Personal history of colonic polyps: Secondary | ICD-10-CM | POA: Diagnosis not present

## 2018-10-12 DIAGNOSIS — D120 Benign neoplasm of cecum: Secondary | ICD-10-CM | POA: Diagnosis not present

## 2018-10-12 DIAGNOSIS — E785 Hyperlipidemia, unspecified: Secondary | ICD-10-CM

## 2018-10-12 NOTE — Telephone Encounter (Signed)
Copied from CRM [id number]. Topic: General - Other >> Oct 09, 2018  4:04 PM Herby AbrahamJohnson, Shiquita C wrote: Reason for CRM: Pt called in to schedule his cpe, pt would like to know if he could come in before apt to have labs completed?   [phone]   Message sent to Dr. Neva SeatGreene and Peterson AoMei - to input labs LMOVM - advised pt to come in on Mon 12/16 or Tues 12/17 for labs

## 2018-10-12 NOTE — Telephone Encounter (Signed)
Future orders placed 

## 2018-10-14 DIAGNOSIS — K621 Rectal polyp: Secondary | ICD-10-CM | POA: Diagnosis not present

## 2018-10-14 DIAGNOSIS — D120 Benign neoplasm of cecum: Secondary | ICD-10-CM | POA: Diagnosis not present

## 2018-10-14 DIAGNOSIS — D123 Benign neoplasm of transverse colon: Secondary | ICD-10-CM | POA: Diagnosis not present

## 2018-10-16 DIAGNOSIS — Z79899 Other long term (current) drug therapy: Secondary | ICD-10-CM | POA: Diagnosis not present

## 2018-10-16 DIAGNOSIS — G4733 Obstructive sleep apnea (adult) (pediatric): Secondary | ICD-10-CM | POA: Diagnosis not present

## 2018-10-16 DIAGNOSIS — F419 Anxiety disorder, unspecified: Secondary | ICD-10-CM | POA: Diagnosis not present

## 2018-10-16 DIAGNOSIS — F902 Attention-deficit hyperactivity disorder, combined type: Secondary | ICD-10-CM | POA: Diagnosis not present

## 2018-10-28 ENCOUNTER — Ambulatory Visit: Payer: BLUE CROSS/BLUE SHIELD | Admitting: Family Medicine

## 2018-11-04 DIAGNOSIS — G4733 Obstructive sleep apnea (adult) (pediatric): Secondary | ICD-10-CM | POA: Diagnosis not present

## 2018-11-10 ENCOUNTER — Other Ambulatory Visit: Payer: Self-pay | Admitting: Endocrinology

## 2018-11-10 ENCOUNTER — Other Ambulatory Visit: Payer: Self-pay | Admitting: Family Medicine

## 2018-11-20 ENCOUNTER — Ambulatory Visit: Payer: BLUE CROSS/BLUE SHIELD | Admitting: Family Medicine

## 2018-11-27 ENCOUNTER — Other Ambulatory Visit: Payer: Self-pay | Admitting: Family Medicine

## 2018-11-27 NOTE — Telephone Encounter (Signed)
Copied from CRM [id number]. Topic: Quick Communication - Rx Refill/Question >> Nov 27, 2018  1:53 PM Arlyss Gandy, NT wrote: Medication: tamsulosin (FLOMAX) 0.4 MG CAPS capsule  Has the patient contacted their pharmacy? Yes.   (Agent: If no, request that the patient contact the pharmacy for the refill.) (Agent: If yes, when and what did the pharmacy advise?)  Preferred Pharmacy (with phone number or street name): [id number] [place], [place] - [address] [phone] (Phone) [phone] (Fax)    Agent: Please be advised that RX refills may take up to 3 business days. We ask that you follow-up with your pharmacy.

## 2018-12-01 ENCOUNTER — Other Ambulatory Visit: Payer: Self-pay | Admitting: Emergency Medicine

## 2018-12-01 MED ORDER — TAMSULOSIN HCL 0.4 MG PO CAPS: 0.4000 mg | ORAL_CAPSULE | Freq: Every day | ORAL | 3 refills | Status: DC

## 2018-12-01 NOTE — Telephone Encounter (Signed)
Informed pt that Rx has been sent to pharmacy, her verbalized understanding.

## 2018-12-01 NOTE — Telephone Encounter (Signed)
Prescribed again as I did the last time I saw him.  However Flomax not on his medication list.  Not sure what happened.  Thanks

## 2018-12-01 NOTE — Telephone Encounter (Signed)
Please advise 

## 2018-12-22 ENCOUNTER — Other Ambulatory Visit: Payer: Self-pay | Admitting: Endocrinology

## 2018-12-22 NOTE — Telephone Encounter (Signed)
Please refill as you deem appropriate

## 2018-12-24 NOTE — Telephone Encounter (Signed)
Has this been refilled?

## 2018-12-24 NOTE — Telephone Encounter (Signed)
He was supposed to be seen in August 2019.  Will refill after he makes his appointment for labs and office visit

## 2018-12-27 ENCOUNTER — Other Ambulatory Visit: Payer: Self-pay | Admitting: Endocrinology

## 2018-12-28 ENCOUNTER — Ambulatory Visit (INDEPENDENT_AMBULATORY_CARE_PROVIDER_SITE_OTHER): Payer: BLUE CROSS/BLUE SHIELD | Admitting: Family Medicine

## 2018-12-28 ENCOUNTER — Other Ambulatory Visit: Payer: Self-pay

## 2018-12-28 ENCOUNTER — Encounter: Payer: Self-pay | Admitting: Family Medicine

## 2018-12-28 VITALS — BP 144/80 | HR 87 | Temp 98.4°F | Resp 16 | Ht 75.0 in | Wt 268.6 lb

## 2018-12-28 DIAGNOSIS — Z5181 Encounter for therapeutic drug level monitoring: Secondary | ICD-10-CM | POA: Diagnosis not present

## 2018-12-28 DIAGNOSIS — Z0001 Encounter for general adult medical examination with abnormal findings: Secondary | ICD-10-CM | POA: Diagnosis not present

## 2018-12-28 DIAGNOSIS — Z23 Encounter for immunization: Secondary | ICD-10-CM

## 2018-12-28 DIAGNOSIS — R319 Hematuria, unspecified: Secondary | ICD-10-CM | POA: Diagnosis not present

## 2018-12-28 DIAGNOSIS — I10 Essential (primary) hypertension: Secondary | ICD-10-CM

## 2018-12-28 DIAGNOSIS — Z0000 Encounter for general adult medical examination without abnormal findings: Secondary | ICD-10-CM

## 2018-12-28 DIAGNOSIS — E785 Hyperlipidemia, unspecified: Secondary | ICD-10-CM

## 2018-12-28 DIAGNOSIS — E349 Endocrine disorder, unspecified: Secondary | ICD-10-CM

## 2018-12-28 LAB — POCT URINALYSIS DIP (MANUAL ENTRY)
BILIRUBIN UA: NEGATIVE mg/dL
Bilirubin, UA: NEGATIVE
Blood, UA: NEGATIVE
Glucose, UA: NEGATIVE mg/dL
Leukocytes, UA: NEGATIVE
Nitrite, UA: NEGATIVE
PH UA: 6 (ref 5.0–8.0)
Protein Ur, POC: NEGATIVE mg/dL
Spec Grav, UA: 1.015 (ref 1.010–1.025)
Urobilinogen, UA: 0.2 E.U./dL

## 2018-12-28 LAB — POC MICROSCOPIC URINALYSIS (UMFC): Mucus: ABSENT

## 2018-12-28 MED ORDER — ZOSTER VAC RECOMB ADJUVANTED 50 MCG/0.5ML IM SUSR: 0.5000 mL | Freq: Once | INTRAMUSCULAR | 1 refills | Status: AC

## 2018-12-28 MED ORDER — HYDROCHLOROTHIAZIDE 12.5 MG PO CAPS: 12.5000 mg | ORAL_CAPSULE | Freq: Every day | ORAL | 3 refills | Status: DC

## 2018-12-28 NOTE — Patient Instructions (Addendum)
Low intensity exercise 150 minutes/week but best to spread that out over most days per week.   I will check the testosterone levels and other monitoring labs today but the testosterone may need to be repeated if it is low as it is usually best checked out between 8 and 10 in the morning.  That can be discussed with Dr. Lucianne Muss.   I will send shingles vaccine to your pharmacy.   Restart hydrochlorothiazide once per day and monitor home blood pressure readings.  If those are over 140/90, return to discuss possible change in medications.  If I need to write a short supply for you to pick up out-of-pocket, let me know.   Thank you for coming in today. Keeping you healthy  Get these tests  Blood pressure- Have your blood pressure checked once a year by your healthcare provider.  Normal blood pressure is 120/80  Weight- Have your body mass index (BMI) calculated to screen for obesity.  BMI is a measure of body fat based on height and weight. You can also calculate your own BMI at [email or web address].  Cholesterol- Have your cholesterol checked every year.  Diabetes- Have your blood sugar checked regularly if you have high blood pressure, high cholesterol, have a family history of diabetes or if you are overweight.  Screening for Colon Cancer- Colonoscopy starting at age 25.  Screening may begin sooner depending on your family history and other health conditions. Follow up colonoscopy as directed by your Gastroenterologist.  Screening for Prostate Cancer- Both blood work (PSA) and a rectal exam help screen for Prostate Cancer.  Screening begins at age 14 with African-American men and at age 74 with Caucasian men.  Screening may begin sooner depending on your family history.  Take these medicines  Aspirin- One aspirin daily can help prevent Heart disease and Stroke.  Flu shot- Every fall.  Tetanus- Every 10 years.  Zostavax- Once after the age of 3 to prevent Shingles.  Pneumonia shot-  Once after the age of 41; if you are younger than 39, ask your healthcare provider if you need a Pneumonia shot.  Take these steps  Don't smoke- If you do smoke, talk to your doctor about quitting.  For tips on how to quit, go to www.smokefree.gov or call 1-800-QUIT-NOW.  Be physically active- Exercise 5 days a week for at least 30 minutes.  If you are not already physically active start slow and gradually work up to 30 minutes of moderate physical activity.  Examples of moderate activity include walking briskly, mowing the yard, dancing, swimming, bicycling, etc.  Eat a healthy diet- Eat a variety of healthy food such as fruits, vegetables, low fat milk, low fat cheese, yogurt, lean meant, poultry, fish, beans, tofu, etc. For more information go to www.thenutritionsource.org  Drink alcohol in moderation- Limit alcohol intake to less than two drinks a day. Never drink and drive.  Dentist- Brush and floss twice daily; visit your dentist twice a year.  Depression- Your emotional health is as important as your physical health. If you're feeling down, or losing interest in things you would normally enjoy please talk to your healthcare provider.  Eye exam- Visit your eye doctor every year.  Safe sex- If you may be exposed to a sexually transmitted infection, use a condom.  Seat belts- Seat belts can save your life; always wear one.  Smoke/Carbon Monoxide detectors- These detectors need to be installed on the appropriate level of your home.  Replace batteries at least once  a year.  Skin cancer- When out in the sun, cover up and use sunscreen 15 SPF or higher.  Violence- If anyone is threatening you, please tell your healthcare provider.  Living Will/ Health care power of attorney- Speak with your healthcare provider and family.  If you have lab work done today you will be contacted with your lab results within the next 2 weeks.  If you have not heard from [place] then please contact us. The  fastest way to get your results is to register for My Chart.   IF you received an x-ray today, you will receive an invoice from [facility]. Please contact [facility] at [phone] with questions or concerns regarding your invoice.   IF you received labwork today, you will receive an invoice from [place]. Please contact [facility] at [phone] with questions or concerns regarding your invoice.   Our billing staff will not be able to assist you with questions regarding bills from these companies.  You will be contacted with the lab results as soon as they are available. The fastest way to get your results is to activate your My Chart account. Instructions are located on the last page of this paperwork. If you have not heard from [place] regarding the results in 2 weeks, please contact this office.

## 2018-12-28 NOTE — Progress Notes (Signed)
Subjective:    Patient ID: [name], male    DOB: [birth year], 63 y.o.   MRN: [id number]  HPI ORAN DILLENBURG is a 63 y.o. male Presents today for: Chief Complaint  Patient presents with  . Annual Exam    need a refill o HCTZ for blood pressure bp was a little elevated today 150/85 patient had a mix up at the pharm on bp meds and he has not been taking bp meds due to the misx up.   Hypertension: BP Readings from Last 3 Encounters:  12/28/18 (!) 144/80  07/16/18 128/75  05/22/18 110/70   Lab Results  Component Value Date   CREATININE 1.29 (H) 09/08/2017  Usually on HCTZ 12.5 mg daily, but off medicines recently. History of obstructive sleep apnea on CPAP.  CPAP compliance 100% when reviewed at neuro on July 16, 2018. Still using daily.   Hyperlipidemia:  Lab Results  Component Value Date   CHOL 128 09/08/2017   HDL 46 09/08/2017   LDLCALC 61 09/08/2017   TRIG 103 09/08/2017   CHOLHDL 2.8 09/08/2017   Lab Results  Component Value Date   ALT 20 09/08/2017   AST 18 09/08/2017   ALKPHOS 34 (L) 09/08/2017   BILITOT 0.3 09/08/2017  Crestor 10 mg daily.  History of BPH with urinary frequency. Has taken Flomax 0.4 mg daily.  Was treated for UTI in July of last year, did note some hematuria at follow-up visit, recommended follow-up with urology. No new urinary symptoms. Did not have urologic follow up but no recent hematuria. Urinating well with flomax.   Concentration difficulty Attention deficit, evaluated September 14, 2018, [place] [facility].  Started on Adderall XR 20 mg, on BID. Followed by [facility].   Hypogonadism: Followed by Dr. Reather Littler.  Treated with testosterone. Has appt in 4 days. Would like labs to have tor review at that appointment.  Lab Results  Component Value Date   CHOL 128 09/08/2017   HDL 46 09/08/2017   LDLCALC 61 09/08/2017   TRIG 103 09/08/2017   CHOLHDL 2.8 09/08/2017   Lab Results  Component  Value Date   PSA 2.77 08/23/2015   PSA 2.10 06/11/2014   Lab Results  Component Value Date   WBC 5.3 11/08/2016   HGB 15.4 11/08/2016   HCT 44.7 11/08/2016   MCV 85 11/08/2016   PLT 223 11/08/2016   Cancer screening Colonoscopy 10/14/2018. Last PSA 2.8 on 09/08/2017.    Immunization History  Administered Date(s) Administered  . Influenza,inj,Quad PF,6+ Mos 08/23/2015, 11/18/2016, 09/08/2017  . Tdap 06/11/2014  shingles: Flu vaccine given today.   Depression screen [facility] 2/9 05/22/2018 05/20/2018 09/08/2017 01/06/2017 11/08/2016  Decreased Interest 0 0 0 0 0  Down, Depressed, Hopeless 0 0 0 0 0  PHQ - 2 Score 0 0 0 0 0    No exam data present  Dental:Every 6 months.   Exercise:  Minimal at present.     Patient Active Problem List   Diagnosis Date Noted  . Dysuria 05/20/2018  . Acute UTI 05/20/2018  . Urinary frequency 05/20/2018  . Obstructive sleep apnea treated with continuous positive airway pressure (CPAP) 01/13/2018  . Sinusitis chronic, frontal 10/15/2017  . Excessive daytime sleepiness 10/15/2017  . Seasonal allergic rhinitis due to pollen 10/15/2017  . Hyperlipidemia 09/05/2014  . Unspecified vitamin D deficiency 06/14/2014  . Benign prostatic hyperplasia with urinary frequency 06/14/2014  . GERD 01/23/2009  . HYPERLIPIDEMIA 12/14/2008  . SLEEP APNEA 11/29/2008  Past Medical History:  Diagnosis Date  . Allergy   . Hyperlipidemia   . Hypertension   . Sleep apnea    CPAP   Past Surgical History:  Procedure Laterality Date  . APPENDECTOMY    . TOOTH EXTRACTION    . VASECTOMY     Allergies  Allergen Reactions  . Iodine Hives   Prior to Admission medications   Medication Sig Start Date End Date Taking? Authorizing Provider  amphetamine-dextroamphetamine (ADDERALL XR) 20 MG 24 hr capsule Take 20 mg by mouth daily.   Yes [provider]  hydrochlorothiazide (MICROZIDE) 12.5 MG capsule Take 1 capsule (12.5 mg total) by mouth daily. 09/08/17   Yes Shade FloodGreene, Crista Nuon R, MD  rosuvastatin (CRESTOR) 10 MG tablet Take 1 tablet (10 mg total) by mouth daily. 09/08/17  Yes Shade FloodGreene, Dabria Wadas R, MD  tamsulosin (FLOMAX) 0.4 MG CAPS capsule Take 1 capsule (0.4 mg total) by mouth daily. 12/01/18  Yes Georgina QuintSagardia, Miguel Jose, MD  Testosterone 1.62 % GEL Place onto the skin.   Yes [provider]  Testosterone 20.25 MG/ACT (1.62%) GEL Apply 1 pump on each shoulder once daily and allow to dry 03/24/18  Yes Reather LittlerKumar, Ajay, MD   Social History   Socioeconomic History  . Marital status: Married    Spouse name: Gavin PoundDeborah  . Number of children: Not on file  . Years of education: Not on file  . Highest education level: Not on file  Occupational History  . Not on file  Social Needs  . Financial resource strain: Not on file  . Food insecurity:    Worry: Not on file    Inability: Not on file  . Transportation needs:    Medical: Not on file    Non-medical: Not on file  Tobacco Use  . Smoking status: Never Smoker  . Smokeless tobacco: Never Used  Substance and Sexual Activity  . Alcohol use: No  . Drug use: No  . Sexual activity: Yes    Birth control/protection: None  Lifestyle  . Physical activity:    Days per week: Not on file    Minutes per session: Not on file  . Stress: Not on file  Relationships  . Social connections:    Talks on phone: Not on file    Gets together: Not on file    Attends religious service: Not on file    Active member of club or organization: Not on file    Attends meetings of clubs or organizations: Not on file    Relationship status: Not on file  . Intimate partner violence:    Fear of current or ex partner: Not on file    Emotionally abused: Not on file    Physically abused: Not on file    Forced sexual activity: Not on file  Other Topics Concern  . Not on file  Social History Narrative   Drinks 1-2 cups caffeine daily.    Review of Systems  Positive for difficulty urinating/with urinary frequency, see  above with BPH.  Neck pain - hx of cervical radiculopathy - ok right now, rare flare.  seasonal allergies - no regular meds, better recently.   decreased concentration.  See above.  13 point review of systems per patient health survey noted.  Negative other than as indicated above or in HPI.      Objective:   Physical Exam Vitals signs reviewed.  Constitutional:      Appearance: He is well-developed.  HENT:  Head: Normocephalic and atraumatic.     Right Ear: External ear normal.     Left Ear: External ear normal.  Eyes:     Conjunctiva/sclera: Conjunctivae normal.     Pupils: Pupils are equal, round, and reactive to light.  Neck:     Musculoskeletal: Normal range of motion and neck supple.     Thyroid: No thyromegaly.  Cardiovascular:     Rate and Rhythm: Normal rate and regular rhythm.     Heart sounds: Normal heart sounds.  Pulmonary:     Effort: Pulmonary effort is normal. No respiratory distress.     Breath sounds: Normal breath sounds. No wheezing.  Abdominal:     General: There is no distension.     Palpations: Abdomen is soft.     Tenderness: There is no abdominal tenderness.     Hernia: There is no hernia in the right inguinal area or left inguinal area.  Genitourinary:    Prostate: Normal.  Musculoskeletal: Normal range of motion.        General: No tenderness.  Lymphadenopathy:     Cervical: No cervical adenopathy.  Skin:    General: Skin is warm and dry.  Neurological:     Mental Status: He is alert and oriented to person, place, and time.     Deep Tendon Reflexes: Reflexes are normal and symmetric.  Psychiatric:        Behavior: Behavior normal.    Vitals:   12/28/18 1422 12/28/18 1429  BP: (!) 180/85 (!) 144/80  Pulse: 87   Resp: 16   Temp: 98.4 F (36.9 C)   TempSrc: Oral   SpO2: 96%   Weight: 268 lb 9.6 oz (121.8 kg)   Height: 6\' 3"  (1.905 m)       Assessment & Plan:   RUCKER TOMA is a 63 y.o. male Annual physical exam  -  -anticipatory guidance as below in AVS, screening labs above. Health maintenance items as above in HPI discussed/recommended as applicable.   Essential hypertension - Plan: hydrochlorothiazide (MICROZIDE) 12.5 MG capsule, Comprehensive metabolic panel  -Anticipate improved control with restart of HCTZ.  Labs pending  Testosterone deficiency - Plan: Testosterone, Free, Total, SHBG Medication monitoring encounter - Plan: PSA, CBC, Comprehensive metabolic panel, Lipid panel  -Labs obtained, follow-up with endocrinologist as planned  Need for prophylactic vaccination and inoculation against influenza - Plan: Flu Vaccine QUAD 6+ mos PF IM (Fluarix Quad PF)  Hyperlipidemia, unspecified hyperlipidemia type  -Tolerating Crestor, continue same.  Labs pending as above, refill if needed.  Need for shingles vaccine - Plan: Zoster Vaccine Adjuvanted [facility]) injection  Hematuria, unspecified type - Plan: POCT Microscopic Urinalysis ([facility]), POCT urinalysis dipstick  -No hematuria noted on in office urinalysis.  Meds ordered this encounter  Medications  . hydrochlorothiazide (MICROZIDE) 12.5 MG capsule    Sig: Take 1 capsule (12.5 mg total) by mouth daily.    Dispense:  90 capsule    Refill:  3  . Zoster Vaccine Adjuvanted [facility]) injection    Sig: Inject 0.5 mLs into the muscle once for 1 dose. Repeat in 2-6 months.    Dispense:  0.5 mL    Refill:  1   Patient Instructions   Low intensity exercise 150 minutes/week but best to spread that out over most days per week.   I will check the testosterone levels and other monitoring labs today but the testosterone may need to be repeated if it is low as it is usually best  checked out between 8 and 10 in the morning.  That can be discussed with Dr. Lucianne Muss.   I will send shingles vaccine to your pharmacy.   Restart hydrochlorothiazide once per day and monitor home blood pressure readings.  If those are over 140/90, return to discuss possible change  in medications.  If I need to write a short supply for you to pick up out-of-pocket, let me know.   Thank you for coming in today. Keeping you healthy  Get these tests  Blood pressure- Have your blood pressure checked once a year by your healthcare provider.  Normal blood pressure is 120/80  Weight- Have your body mass index (BMI) calculated to screen for obesity.  BMI is a measure of body fat based on height and weight. You can also calculate your own BMI at [email or web address].  Cholesterol- Have your cholesterol checked every year.  Diabetes- Have your blood sugar checked regularly if you have high blood pressure, high cholesterol, have a family history of diabetes or if you are overweight.  Screening for Colon Cancer- Colonoscopy starting at age 31.  Screening may begin sooner depending on your family history and other health conditions. Follow up colonoscopy as directed by your Gastroenterologist.  Screening for Prostate Cancer- Both blood work (PSA) and a rectal exam help screen for Prostate Cancer.  Screening begins at age 51 with African-American men and at age 41 with Caucasian men.  Screening may begin sooner depending on your family history.  Take these medicines  Aspirin- One aspirin daily can help prevent Heart disease and Stroke.  Flu shot- Every fall.  Tetanus- Every 10 years.  Zostavax- Once after the age of 36 to prevent Shingles.  Pneumonia shot- Once after the age of 72; if you are younger than 62, ask your healthcare provider if you need a Pneumonia shot.  Take these steps  Don't smoke- If you do smoke, talk to your doctor about quitting.  For tips on how to quit, go to www.smokefree.gov or call 1-800-QUIT-NOW.  Be physically active- Exercise 5 days a week for at least 30 minutes.  If you are not already physically active start slow and gradually work up to 30 minutes of moderate physical activity.  Examples of moderate activity include walking briskly,  mowing the yard, dancing, swimming, bicycling, etc.  Eat a healthy diet- Eat a variety of healthy food such as fruits, vegetables, low fat milk, low fat cheese, yogurt, lean meant, poultry, fish, beans, tofu, etc. For more information go to www.thenutritionsource.org  Drink alcohol in moderation- Limit alcohol intake to less than two drinks a day. Never drink and drive.  Dentist- Brush and floss twice daily; visit your dentist twice a year.  Depression- Your emotional health is as important as your physical health. If you're feeling down, or losing interest in things you would normally enjoy please talk to your healthcare provider.  Eye exam- Visit your eye doctor every year.  Safe sex- If you may be exposed to a sexually transmitted infection, use a condom.  Seat belts- Seat belts can save your life; always wear one.  Smoke/Carbon Monoxide detectors- These detectors need to be installed on the appropriate level of your home.  Replace batteries at least once a year.  Skin cancer- When out in the sun, cover up and use sunscreen 15 SPF or higher.  Violence- If anyone is threatening you, please tell your healthcare provider.  Living Will/ Health care power of attorney- Speak with your healthcare provider and  family.  If you have lab work done today you will be contacted with your lab results within the next 2 weeks.  If you have not heard from us then please contact us. The fastest way to get your results is to register for My Chart.   IF you received an x-ray today, you will receive an invoice from [facility]. Please contact [facility] at [phone] with questions or concerns regarding your invoice.   IF you received labwork today, you will receive an invoice from [facility]. Please contact [facility] at [phone] with questions or concerns regarding your invoice.   Our billing staff will not be able to assist you with questions regarding bills from these  companies.  You will be contacted with the lab results as soon as they are available. The fastest way to get your results is to activate your My Chart account. Instructions are located on the last page of this paperwork. If you have not heard from us regarding the results in 2 weeks, please contact this office.       Signed,   Meredith StaggersJeffrey Ciaran Begay, MD Primary Care at [facility].  12/30/18 10:16 PM

## 2018-12-29 LAB — CBC
Hematocrit: 48.1 % (ref 37.5–51.0)
Hemoglobin: 16.1 g/dL (ref 13.0–17.7)
MCH: 28.3 pg (ref 26.6–33.0)
MCHC: 33.5 g/dL (ref 31.5–35.7)
MCV: 85 fL (ref 79–97)
Platelets: 242 10*3/uL (ref 150–450)
RBC: 5.69 x10E6/uL (ref 4.14–5.80)
RDW: 14.4 % (ref 11.6–15.4)
WBC: 5.6 10*3/uL (ref 3.4–10.8)

## 2018-12-29 LAB — LIPID PANEL
CHOLESTEROL TOTAL: 142 mg/dL (ref 100–199)
Chol/HDL Ratio: 2.8 ratio (ref 0.0–5.0)
HDL: 50 mg/dL (ref >39)
LDL Calculated: 74 mg/dL (ref 0–99)
Triglycerides: 91 mg/dL (ref 0–149)
VLDL Cholesterol Cal: 18 mg/dL (ref 5–40)

## 2018-12-29 LAB — COMPREHENSIVE METABOLIC PANEL
ALT: 33 IU/L (ref 0–44)
AST: 28 IU/L (ref 0–40)
Albumin/Globulin Ratio: 1.8 (ref 1.2–2.2)
Albumin: 4.6 g/dL (ref 3.8–4.9)
Alkaline Phosphatase: 56 IU/L (ref 39–117)
BUN / CREAT RATIO: 11 (ref 10–24)
BUN: 14 mg/dL (ref 8–27)
Bilirubin Total: 0.4 mg/dL (ref 0.0–1.2)
CO2: 23 mmol/L (ref 20–29)
Calcium: 9.4 mg/dL (ref 8.6–10.2)
Chloride: 103 mmol/L (ref 96–106)
Creatinine, Ser: 1.23 mg/dL (ref 0.76–1.27)
GFR calc Af Amer: 73 mL/min/{1.73_m2} (ref >59)
GFR calc non Af Amer: 63 mL/min/{1.73_m2} (ref >59)
GLUCOSE: 89 mg/dL (ref 65–99)
Globulin, Total: 2.5 g/dL (ref 1.5–4.5)
Potassium: 4.3 mmol/L (ref 3.5–5.2)
Sodium: 142 mmol/L (ref 134–144)
Total Protein: 7.1 g/dL (ref 6.0–8.5)

## 2018-12-29 LAB — TESTOSTERONE, FREE, TOTAL, SHBG
Sex Hormone Binding: 23.5 nmol/L (ref 19.3–76.4)
Testosterone, Free: 5.4 pg/mL — ABNORMAL LOW (ref 6.6–18.1)
Testosterone: 247 ng/dL — ABNORMAL LOW (ref 264–916)

## 2018-12-29 LAB — PSA: Prostate Specific Ag, Serum: 3 ng/mL (ref 0.0–4.0)

## 2018-12-31 ENCOUNTER — Other Ambulatory Visit: Payer: Self-pay | Admitting: Endocrinology

## 2018-12-31 ENCOUNTER — Encounter: Payer: Self-pay | Admitting: Endocrinology

## 2018-12-31 ENCOUNTER — Ambulatory Visit: Payer: BLUE CROSS/BLUE SHIELD | Admitting: Endocrinology

## 2018-12-31 ENCOUNTER — Other Ambulatory Visit: Payer: Self-pay

## 2018-12-31 VITALS — BP 132/72 | HR 84 | Ht 75.0 in | Wt 270.6 lb

## 2018-12-31 DIAGNOSIS — E230 Hypopituitarism: Secondary | ICD-10-CM

## 2018-12-31 DIAGNOSIS — R7301 Impaired fasting glucose: Secondary | ICD-10-CM | POA: Diagnosis not present

## 2018-12-31 DIAGNOSIS — E559 Vitamin D deficiency, unspecified: Secondary | ICD-10-CM

## 2018-12-31 MED ORDER — TESTOSTERONE 20.25 MG/ACT (1.62%) TD GEL: TRANSDERMAL | 2 refills | Status: DC

## 2018-12-31 NOTE — Progress Notes (Signed)
Patient ID: Dustin Mckee, male   DOB: 10-Apr-1956, 63 y.o.   MRN: [id number]            Referring physician: Karyl Mckee  Reason for consultation: low testosterone   Chief complaint: fatigue  History of Present Illness  Hypogonadismwas diagnosed in 2012 by his previous primary care physician when he was having complaints of fatigue and lethargy He does not know the details about his evaluation and no records are available However he was treated with a testosterone gel for several months with improvement in his symptoms He then stopped treatment on his own  He had had complaints offatigue, some lethargy, decreased motivation, decreased libido for a few months prior to his initial consultation He had testosterone levels levels that were significantly low  With his evaluation 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, prolactin upper normal He had been started on CLOMIPHENE half tablet 3 times a week since early April 2017  RECENT history:  In 9/18 with taking clomiphene 3 times a week follow-up level was up to 326 Subsequently his levels had come down slowly Because of his complaints of  having some decreased energy level and libido, lower motivation level he was switched from clomiphene to testosterone gel He did not come back for follow-up as directed He says that with using the testosterone gel he was feeling much better with his energy level and libido  Although he has run out of his testosterone gel about a month ago he still does not feel any recurrence of his fatigue or decreased libido His PCP has done his testosterone levels although these were drawn in the late afternoon, free testosterone level is also low  His labs showtestosterone levels as below:  Lab Results  Component Value Date   TESTOSTERONE 247 (L) 12/28/2018   TESTOSTERONE 273.42 (L) 03/20/2018   TESTOSTERONE 283.29 (L) 12/19/2017   TESTOSTERONE  325.92 08/04/2017    Baseline free testosterone level was decreased at 19, normal  >35.8  Prolactin level: 15.4  Lab Results  Component Value Date   LH 6.04 03/20/2018      Diabetes screening: See review of systems     Allergies as of 12/31/2018      Reactions   Iodine Hives      Medication List       Accurate as of December 31, 2018  8:21 AM. Always use your most recent med list.        amphetamine-dextroamphetamine 20 MG 24 hr capsule Commonly known as:  ADDERALL XR Take 20 mg by mouth daily.   hydrochlorothiazide 12.5 MG capsule Commonly known as:  MICROZIDE Take 1 capsule (12.5 mg total) by mouth daily.   rosuvastatin 10 MG tablet Commonly known as:  CRESTOR Take 1 tablet (10 mg total) by mouth daily.   tamsulosin 0.4 MG Caps capsule Commonly known as:  FLOMAX Take 1 capsule (0.4 mg total) by mouth daily.   Testosterone 20.25 MG/ACT (1.62%) Gel Apply 1 pump on each shoulder once daily and allow to dry       Allergies:  Allergies  Allergen Reactions  . Iodine Hives    Past Medical History:  Diagnosis Date  . Allergy   . Hyperlipidemia   . Hypertension   . Sleep apnea    CPAP    Past Surgical History:  Procedure Laterality Date  . APPENDECTOMY    . TOOTH EXTRACTION    . VASECTOMY  Family History  Problem Relation Age of Onset  . Heart disease Mother   . Kidney disease Mother   . Heart disease Father        defibrillator/CHF.  . Diabetes Father   . Heart disease Sister        CHF  . Diabetes Sister   . Heart disease Sister        CHF    Social History:  reports that he has never smoked. He has never used smokeless tobacco. He reports that he does not drink alcohol or use drugs.  Review of Systems  He has had prediabetes with fasting glucose 105 and A1c 5.8  Glucose is right at 100 now and A1c is 5.7 He is trying to exercise but is not able to lose weight  His maximum weight has been about 290, his weight was  recorded as 284 in 04/2016  Exercising since 2/19   Wt Readings from Last 3 Encounters:  12/31/18 270 lb 9.6 oz (122.7 kg)  12/28/18 268 lb 9.6 oz (121.8 kg)  07/16/18 275 lb 3.2 oz (124.8 kg)   Vitamin D deficiency  General Examination:   BP 132/72 (BP Location: Left Arm, Patient Position: Sitting, Cuff Size: Large)   Pulse 84   Ht 6\' 3"  (1.905 m)   Wt 270 lb 9.6 oz (122.7 kg)   SpO2 94%   BMI 33.82 kg/m    Assessment/ Plan:  Hypogonadotropic Hypogonadism, 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 He has had longstanding problems with hypogonadism  He has subjectively done better with testosterone gel compared to clomiphene However did not have any testosterone levels while on treatment did not follow-up last year as directed  Most likely he should be able to continue AndroGel for symptomatic relief and adequate control of his testosterone deficiency Even though his labs were done in the late afternoon do not think this changes his management  Start back on his AndroGel but 1 pump on each arm and come back in a month for repeat testosterone level  Impaired fasting glucose: Fasting glucose will be rechecked along with A1c  Stable vitamin D deficiency: We will check his lab levels in a month  Total visit time 15 minutes  Dustin Mckee 12/31/2018, 8:21 AM

## 2019-01-22 DIAGNOSIS — Z79899 Other long term (current) drug therapy: Secondary | ICD-10-CM | POA: Diagnosis not present

## 2019-01-22 DIAGNOSIS — F419 Anxiety disorder, unspecified: Secondary | ICD-10-CM | POA: Diagnosis not present

## 2019-01-22 DIAGNOSIS — F902 Attention-deficit hyperactivity disorder, combined type: Secondary | ICD-10-CM | POA: Diagnosis not present

## 2019-01-22 DIAGNOSIS — G4733 Obstructive sleep apnea (adult) (pediatric): Secondary | ICD-10-CM | POA: Diagnosis not present

## 2019-01-29 ENCOUNTER — Other Ambulatory Visit: Payer: Self-pay

## 2019-01-29 ENCOUNTER — Other Ambulatory Visit (INDEPENDENT_AMBULATORY_CARE_PROVIDER_SITE_OTHER): Payer: BLUE CROSS/BLUE SHIELD

## 2019-01-29 DIAGNOSIS — R7301 Impaired fasting glucose: Secondary | ICD-10-CM | POA: Diagnosis not present

## 2019-01-29 DIAGNOSIS — E559 Vitamin D deficiency, unspecified: Secondary | ICD-10-CM

## 2019-01-29 DIAGNOSIS — E230 Hypopituitarism: Secondary | ICD-10-CM

## 2019-01-29 LAB — GLUCOSE, RANDOM: Glucose, Bld: 109 mg/dL — ABNORMAL HIGH (ref 70–99)

## 2019-01-29 LAB — CBC
HCT: 47.3 % (ref 39.0–52.0)
Hemoglobin: 16.3 g/dL (ref 13.0–17.0)
MCHC: 34.4 g/dL (ref 30.0–36.0)
MCV: 84.9 fl (ref 78.0–100.0)
Platelets: 221 10*3/uL (ref 150.0–400.0)
RBC: 5.58 Mil/uL (ref 4.22–5.81)
RDW: 14.6 % (ref 11.5–15.5)
WBC: 5.8 10*3/uL (ref 4.0–10.5)

## 2019-01-29 LAB — HEMOGLOBIN A1C: HEMOGLOBIN A1C: 5.9 % (ref 4.6–6.5)

## 2019-01-29 LAB — TESTOSTERONE: Testosterone: 137.28 ng/dL — ABNORMAL LOW (ref 300.00–890.00)

## 2019-01-29 LAB — VITAMIN D 25 HYDROXY (VIT D DEFICIENCY, FRACTURES): VITD: 30.2 ng/mL (ref 30.00–100.00)

## 2019-02-03 ENCOUNTER — Telehealth: Payer: Self-pay | Admitting: Endocrinology

## 2019-02-03 NOTE — Telephone Encounter (Signed)
Patient is returning about lab results.

## 2019-02-07 ENCOUNTER — Other Ambulatory Visit: Payer: Self-pay | Admitting: Cardiology

## 2019-02-07 DIAGNOSIS — E782 Mixed hyperlipidemia: Secondary | ICD-10-CM

## 2019-03-07 ENCOUNTER — Other Ambulatory Visit: Payer: Self-pay | Admitting: Cardiology

## 2019-03-09 ENCOUNTER — Other Ambulatory Visit: Payer: Self-pay | Admitting: Emergency Medicine

## 2019-03-11 DIAGNOSIS — G4733 Obstructive sleep apnea (adult) (pediatric): Secondary | ICD-10-CM | POA: Diagnosis not present

## 2019-03-12 ENCOUNTER — Other Ambulatory Visit: Payer: Self-pay | Admitting: Cardiology

## 2019-03-15 NOTE — Telephone Encounter (Signed)
Please fill

## 2019-03-30 ENCOUNTER — Encounter: Payer: Self-pay | Admitting: Family Medicine

## 2019-04-05 ENCOUNTER — Other Ambulatory Visit: Payer: Self-pay | Admitting: Endocrinology

## 2019-04-27 ENCOUNTER — Other Ambulatory Visit: Payer: Self-pay | Admitting: Endocrinology

## 2019-04-27 DIAGNOSIS — E230 Hypopituitarism: Secondary | ICD-10-CM

## 2019-04-28 ENCOUNTER — Other Ambulatory Visit: Payer: Self-pay

## 2019-04-28 ENCOUNTER — Other Ambulatory Visit (INDEPENDENT_AMBULATORY_CARE_PROVIDER_SITE_OTHER): Payer: BC Managed Care – PPO

## 2019-04-28 DIAGNOSIS — F902 Attention-deficit hyperactivity disorder, combined type: Secondary | ICD-10-CM | POA: Diagnosis not present

## 2019-04-28 DIAGNOSIS — G4733 Obstructive sleep apnea (adult) (pediatric): Secondary | ICD-10-CM | POA: Diagnosis not present

## 2019-04-28 DIAGNOSIS — E230 Hypopituitarism: Secondary | ICD-10-CM

## 2019-04-28 DIAGNOSIS — F419 Anxiety disorder, unspecified: Secondary | ICD-10-CM | POA: Diagnosis not present

## 2019-04-28 DIAGNOSIS — Z79899 Other long term (current) drug therapy: Secondary | ICD-10-CM | POA: Diagnosis not present

## 2019-04-28 LAB — CBC
HCT: 48.8 % (ref 39.0–52.0)
Hemoglobin: 16.6 g/dL (ref 13.0–17.0)
MCHC: 33.9 g/dL (ref 30.0–36.0)
MCV: 85.6 fl (ref 78.0–100.0)
Platelets: 254 10*3/uL (ref 150.0–400.0)
RBC: 5.7 Mil/uL (ref 4.22–5.81)
RDW: 14.9 % (ref 11.5–15.5)
WBC: 6.4 10*3/uL (ref 4.0–10.5)

## 2019-04-28 LAB — TESTOSTERONE: Testosterone: 196.14 ng/dL — ABNORMAL LOW (ref 300.00–890.00)

## 2019-04-29 NOTE — Progress Notes (Signed)
Patient ID: Dustin BasquesGregory A Mckee, male   DOB: 05/25/1956, 63 y.o.   MRN: [id number]            Referring physician: [name]   Chief complaint: Low testosterone follow-up  History of Present Illness  Hypogonadismwas diagnosed in 2012 by his previous primary care physician when he was having complaints of fatigue and lethargy He does not know the details about his evaluation and no records are available However he was treated with a testosterone gel for several months with improvement in his symptoms He then stopped treatment on his own  He had had complaints offatigue, some lethargy, decreased motivation, decreased libido for a few months prior to his initial consultation He had testosterone levels levels that were significantly low  With his evaluation 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, prolactin upper normal He had been started on CLOMIPHENE half tablet 3 times a week since early April 2017  RECENT history:  In 9/18 with taking clomiphene 3 times a week follow-up level was up to 326 Subsequently his levels had come down slowly Because of his complaints of  having some decreased energy level and libido, lower motivation level he was switched from clomiphene to testosterone gel With this change he was feeling much better with his energy level and libido  When seen in follow-up in 2/19 he had been out of his testosterone gel but was not complaining of fatigue or decreased libido, was started back on the gel  Testosterone level had gone down to as low as 137 in March and the dose was increased to 3 pumps daily Over the last month or so he has felt more energy and is able to do most of his physical activities He is quite consistent with applying the gel every morning and allowing it to dry He applied the gel on the morning he had his lab work  However his testosterone level is still below 200  His labs  showtestosterone history as below:  Lab Results  Component Value Date   TESTOSTERONE 196.14 (L) 04/28/2019   TESTOSTERONE 137.28 (L) 01/29/2019   TESTOSTERONE 247 (L) 12/28/2018   TESTOSTERONE 273.42 (L) 03/20/2018    Baseline free testosterone level was decreased at 19, normal  >35.8  Prolactin level: 15.4  Lab Results  Component Value Date   LH 6.04 03/20/2018      Diabetes screening: See review of systems     Allergies as of 04/30/2019      Reactions   Iodine Hives      Medication List       Accurate as of April 30, 2019  8:23 AM. If you have any questions, ask your nurse or doctor.        amphetamine-dextroamphetamine 20 MG 24 hr capsule Commonly known as: ADDERALL XR Take 20 mg by mouth daily.   hydrochlorothiazide 12.5 MG capsule Commonly known as: MICROZIDE Take 1 capsule (12.5 mg total) by mouth daily.   rosuvastatin 20 MG tablet Commonly known as: CRESTOR TAKE 1 TABLET BY MOUTH EVERY DAY   tamsulosin 0.4 MG Caps capsule Commonly known as: FLOMAX TAKE 1 CAPSULE BY MOUTH EVERY DAY   Testosterone 20.25 MG/ACT (1.62%) Gel Apply 1 pump on one shoulder and 2 on the other once daily and allow to dry       Allergies:  Allergies  Allergen Reactions  . Iodine Hives    Past Medical History:  Diagnosis Date  . Allergy   .  Hyperlipidemia   . Hypertension   . Sleep apnea    CPAP    Past Surgical History:  Procedure Laterality Date  . APPENDECTOMY    . TOOTH EXTRACTION    . VASECTOMY      Family History  Problem Relation Age of Onset  . Heart disease Mother   . Kidney disease Mother   . Heart disease Father        defibrillator/CHF.  . Diabetes Father   . Heart disease Sister        CHF  . Diabetes Sister   . Heart disease Sister        CHF    Social History:  reports that he has never smoked. He has never used smokeless tobacco. He reports that he does not drink alcohol or use drugs.  Review of Systems  He has had  prediabetes with fasting glucose 105 and A1c 5.8  Glucose last was 109  He is generally active but not going to the gym He has not lost any weight Not aware of any specific diet  His maximum weight has been about 290, his weight was recorded as 284 in 04/2016    Wt Readings from Last 3 Encounters:  04/30/19 271 lb 3.2 oz (123 kg)  12/31/18 270 lb 9.6 oz (122.7 kg)  12/28/18 268 lb 9.6 oz (121.8 kg)   Lab Results  Component Value Date   HGBA1C 5.9 01/29/2019   HGBA1C 5.7 03/20/2018   HGBA1C 5.8 08/04/2017   Lab Results  Component Value Date   LDLCALC 74 12/28/2018   CREATININE 1.23 12/28/2018    Vitamin D deficiency: Has been adequately replaced   General Examination:   BP 132/72 (BP Location: Left Arm, Patient Position: Sitting, Cuff Size: Normal)   Pulse 89   Ht 6\' 3"  (1.905 m)   Wt 271 lb 3.2 oz (123 kg)   SpO2 94%   BMI 33.90 kg/m    Assessment/ Plan:  Hypogonadotropic Hypogonadism, 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  This has been present for several years  He has subjectively done better with testosterone gel compared to clomiphene However even with applying the testosterone gel and going up on the dose to 3 pumps he still has a low testosterone level This is despite his subjectively feeling much better with his energy level As before his testosterone levels do not always correlate with symptoms  Since his CBC shows hemoglobin up to 16.6 and he is subjectively doing well will not increase the dose He will continue to apply 2 pumps on 1 arm and 1 on the other Also he needs to check the coverage for Jatenzo tablets, currently not on formulary   Impaired fasting glucose: Last glucose was 1  Discussed need for weight loss and working on his diet and regular exercise Advised him that it would be easier to prevent diabetes rather than treatment once it is established  Fasting glucose will be rechecked along with A1c on the next  visit  Total visit time 15 minutes  Keaghan Staton 04/30/2019, 8:23 AM

## 2019-04-30 ENCOUNTER — Other Ambulatory Visit: Payer: Self-pay

## 2019-04-30 ENCOUNTER — Ambulatory Visit: Payer: BC Managed Care – PPO | Admitting: Endocrinology

## 2019-04-30 ENCOUNTER — Ambulatory Visit: Payer: BLUE CROSS/BLUE SHIELD | Admitting: Endocrinology

## 2019-04-30 ENCOUNTER — Encounter: Payer: Self-pay | Admitting: Endocrinology

## 2019-04-30 VITALS — BP 132/72 | HR 89 | Ht 75.0 in | Wt 271.2 lb

## 2019-04-30 DIAGNOSIS — E230 Hypopituitarism: Secondary | ICD-10-CM | POA: Diagnosis not present

## 2019-04-30 DIAGNOSIS — R7301 Impaired fasting glucose: Secondary | ICD-10-CM | POA: Diagnosis not present

## 2019-04-30 NOTE — Patient Instructions (Signed)
Check coverage for jatenzo pills

## 2019-05-26 ENCOUNTER — Ambulatory Visit: Payer: BC Managed Care – PPO | Admitting: Podiatry

## 2019-05-26 ENCOUNTER — Other Ambulatory Visit: Payer: Self-pay | Admitting: Podiatry

## 2019-05-26 ENCOUNTER — Other Ambulatory Visit: Payer: Self-pay

## 2019-05-26 ENCOUNTER — Encounter: Payer: Self-pay | Admitting: Podiatry

## 2019-05-26 ENCOUNTER — Ambulatory Visit (INDEPENDENT_AMBULATORY_CARE_PROVIDER_SITE_OTHER): Payer: BC Managed Care – PPO

## 2019-05-26 VITALS — Temp 96.3°F

## 2019-05-26 DIAGNOSIS — M79671 Pain in right foot: Secondary | ICD-10-CM

## 2019-05-26 DIAGNOSIS — L600 Ingrowing nail: Secondary | ICD-10-CM

## 2019-05-26 DIAGNOSIS — M779 Enthesopathy, unspecified: Secondary | ICD-10-CM

## 2019-05-26 MED ORDER — NEOMYCIN-POLYMYXIN-HC 3.5-10000-1 OT SOLN: OTIC | 0 refills | Status: DC

## 2019-05-26 NOTE — Patient Instructions (Signed)

## 2019-05-27 NOTE — Progress Notes (Signed)
Subjective:   Patient ID: Dustin Mckee, male   DOB: 63 y.o.   MRN: [id number]   HPI Patient presents stating he feels like there is something in the inside of his foot or his walking on a rock and he states he is got a painful ingrown toenail on his right big toe for 3 months that he is tried to trim and soak without relief of symptoms.  Patient does not smoke likes to be active   Review of Systems  All other systems reviewed and are negative.       Objective:  Physical Exam Vitals signs and nursing note reviewed.  Constitutional:      Appearance: He is well-developed.  Pulmonary:     Effort: Pulmonary effort is normal.  Musculoskeletal: Normal range of motion.  Skin:    General: Skin is warm.  Neurological:     Mental Status: He is alert.     Neurovascular status intact muscle strength is adequate range of motion within normal limits with patient found to have incurvated right hallux medial border that is painful when pressed and makes shoe gear difficult.  Patient is noted to have plantar lesion right that is painful when pressed it is hard for him to walk on.  Patient has good digital perfusion well oriented x3     Assessment:  Ingrown toenail deformity right hallux medial border with pain along with plantar keratotic lesion right     Plan:  H&P conditions reviewed and recommended correction of the nail border.  Patient wants procedure understanding risk and signed consent form and today I infiltrated the right hallux 60 mg Xylocaine Marcaine mixture I removed the medial border with sterile instrumentation exposed matrix and applied phenol 3 applications 30 seconds followed by alcohol lavage and sterile dressing.  Gave instructions on soaks and today I debrided the plantar lesion with no indications of pathology or bleeding associated with it

## 2019-06-14 ENCOUNTER — Other Ambulatory Visit: Payer: Self-pay | Admitting: Cardiology

## 2019-06-18 DIAGNOSIS — Z23 Encounter for immunization: Secondary | ICD-10-CM | POA: Diagnosis not present

## 2019-06-24 ENCOUNTER — Telehealth: Payer: Self-pay

## 2019-06-24 NOTE — Telephone Encounter (Signed)
PA initiated via [email or web address] for Testosterone  20.25mg /ACT (1.62%) gel.  Dustin Mckee Dustin Mckee - Rx #: [id number] Need help? Call us at [phone] Status Sent to [facility] Testosterone 20.25 MG/ACT(1.62%) gel Form Charity fundraiser PA Form (NCPDP) Original Claim Info [facility] REQ-MD CALL [phone].DRUG REQUIRES PRIOR AUTHORIZATION(PHARMACY HELP DESK [phone])

## 2019-06-25 DIAGNOSIS — G4733 Obstructive sleep apnea (adult) (pediatric): Secondary | ICD-10-CM | POA: Diagnosis not present

## 2019-06-28 ENCOUNTER — Encounter: Payer: Self-pay | Admitting: Family Medicine

## 2019-06-28 ENCOUNTER — Other Ambulatory Visit: Payer: Self-pay

## 2019-06-28 ENCOUNTER — Ambulatory Visit: Payer: BLUE CROSS/BLUE SHIELD | Admitting: Family Medicine

## 2019-06-28 VITALS — BP 148/91 | HR 85 | Temp 98.3°F | Resp 16 | Wt 268.0 lb

## 2019-06-28 DIAGNOSIS — I10 Essential (primary) hypertension: Secondary | ICD-10-CM | POA: Diagnosis not present

## 2019-06-28 DIAGNOSIS — N400 Enlarged prostate without lower urinary tract symptoms: Secondary | ICD-10-CM

## 2019-06-28 DIAGNOSIS — E785 Hyperlipidemia, unspecified: Secondary | ICD-10-CM

## 2019-06-28 LAB — COMPREHENSIVE METABOLIC PANEL
ALT: 35 IU/L (ref 0–44)
AST: 25 IU/L (ref 0–40)
Albumin/Globulin Ratio: 1.8 (ref 1.2–2.2)
Albumin: 4.6 g/dL (ref 3.8–4.9)
Alkaline Phosphatase: 52 IU/L (ref 39–117)
BUN/Creatinine Ratio: 11 (ref 10–24)
BUN: 15 mg/dL (ref 8–27)
Bilirubin Total: 0.4 mg/dL (ref 0.0–1.2)
CO2: 23 mmol/L (ref 20–29)
Calcium: 9.5 mg/dL (ref 8.6–10.2)
Chloride: 102 mmol/L (ref 96–106)
Creatinine, Ser: 1.35 mg/dL — ABNORMAL HIGH (ref 0.76–1.27)
GFR calc Af Amer: 66 mL/min/{1.73_m2} (ref >59)
GFR calc non Af Amer: 57 mL/min/{1.73_m2} — ABNORMAL LOW (ref >59)
Globulin, Total: 2.6 g/dL (ref 1.5–4.5)
Glucose: 103 mg/dL — ABNORMAL HIGH (ref 65–99)
Potassium: 4 mmol/L (ref 3.5–5.2)
Sodium: 141 mmol/L (ref 134–144)
Total Protein: 7.2 g/dL (ref 6.0–8.5)

## 2019-06-28 LAB — LIPID PANEL
Chol/HDL Ratio: 2.9 ratio (ref 0.0–5.0)
Cholesterol, Total: 128 mg/dL (ref 100–199)
HDL: 44 mg/dL (ref >39)
LDL Calculated: 69 mg/dL (ref 0–99)
Triglycerides: 76 mg/dL (ref 0–149)
VLDL Cholesterol Cal: 15 mg/dL (ref 5–40)

## 2019-06-28 MED ORDER — ROSUVASTATIN CALCIUM 20 MG PO TABS: 20.0000 mg | ORAL_TABLET | Freq: Every day | ORAL | 1 refills | Status: DC

## 2019-06-28 MED ORDER — HYDROCHLOROTHIAZIDE 25 MG PO TABS: 25.0000 mg | ORAL_TABLET | Freq: Every day | ORAL | 1 refills | Status: DC

## 2019-06-28 MED ORDER — TAMSULOSIN HCL 0.4 MG PO CAPS: 0.4000 mg | ORAL_CAPSULE | Freq: Every day | ORAL | 1 refills | Status: DC

## 2019-06-28 NOTE — Progress Notes (Signed)
Subjective:    Patient ID: Dustin Mckee, male    DOB: 28-May-1956, 63 y.o.   MRN: [id number]  HPI Dustin Mckee is a 63 y.o. male Presents today for: Chief Complaint  Patient presents with  . Hypertension    here for his 6 month f/u not having any other issus at this time. Restarted hctz last visit  bp still been running 120/80 at home    Hypertension: BP Readings from Last 3 Encounters:  06/28/19 (!) 158/91  04/30/19 132/72  12/31/18 132/72   Lab Results  Component Value Date   CREATININE 1.23 12/28/2018  Off the hydrochlorothiazide at last visit but previously been controlled.  Restart HCTZ 12.5 mg daily at last visit. No urinary side effects.  Taking meds daily. No recent missed doses. Improving diet.  Home readings: 130's/80-90's.   Wt Readings from Last 3 Encounters:  06/28/19 268 lb (121.6 kg)  04/30/19 271 lb 3.2 oz (123 kg)  12/31/18 270 lb 9.6 oz (122.7 kg)   Obstructive sleep apnea: On CPAP, great compliance when evaluated by neurology last year. Using nightly.   Hyperlipidemia:  Lab Results  Component Value Date   CHOL 142 12/28/2018   HDL 50 12/28/2018   LDLCALC 74 12/28/2018   TRIG 91 12/28/2018   CHOLHDL 2.8 12/28/2018   Lab Results  Component Value Date   ALT 33 12/28/2018   AST 28 12/28/2018   ALKPHOS 56 12/28/2018   BILITOT 0.4 12/28/2018  Crestor 10 mg daily.  Stable LDL in February. No new myalgias.   BPH With urinary frequency, treated with Flomax 0.4 mg/day. Working well.   Patient Active Problem List   Diagnosis Date Noted  . Dysuria 05/20/2018  . Acute UTI 05/20/2018  . Urinary frequency 05/20/2018  . Obstructive sleep apnea treated with continuous positive airway pressure (CPAP) 01/13/2018  . Sinusitis chronic, frontal 10/15/2017  . Excessive daytime sleepiness 10/15/2017  . Seasonal allergic rhinitis due to pollen 10/15/2017  . Hyperlipidemia 09/05/2014  . Vitamin D deficiency 06/14/2014  . Benign prostatic hyperplasia  with urinary frequency 06/14/2014  . GERD 01/23/2009  . HYPERLIPIDEMIA 12/14/2008  . SLEEP APNEA 11/29/2008   Past Medical History:  Diagnosis Date  . Allergy   . Hyperlipidemia   . Hypertension   . Sleep apnea    CPAP   Past Surgical History:  Procedure Laterality Date  . APPENDECTOMY    . TOOTH EXTRACTION    . VASECTOMY     Allergies  Allergen Reactions  . Iodine Hives   Prior to Admission medications   Medication Sig Start Date End Date Taking? Authorizing Provider  amphetamine-dextroamphetamine (ADDERALL XR) 20 MG 24 hr capsule Take 20 mg by mouth daily.    [provider]  hydrochlorothiazide (MICROZIDE) 12.5 MG capsule Take 1 capsule (12.5 mg total) by mouth daily. 12/28/18   Wendie Agreste, MD  neomycin-polymyxin-hydrocortisone (CORTISPORIN) OTIC solution Apply 1 to 2 drops to toe BID after soaking 05/26/19   Wallene Huh, DPM  rosuvastatin (CRESTOR) 20 MG tablet TAKE 1 TABLET BY MOUTH EVERY DAY 06/14/19   Adrian Prows, MD  tamsulosin (FLOMAX) 0.4 MG CAPS capsule TAKE 1 CAPSULE BY MOUTH EVERY DAY 03/09/19   Wendie Agreste, MD  Testosterone 20.25 MG/ACT (1.62%) GEL Apply 1 pump on one shoulder and 2 on the other once daily and allow to dry 04/05/19   Elayne Snare, MD   Social History   Socioeconomic History  . Marital status: Married  Spouse name: Gavin PoundDeborah  . Number of children: Not on file  . Years of education: Not on file  . Highest education level: Not on file  Occupational History  . Not on file  Social Needs  . Financial resource strain: Not on file  . Food insecurity    Worry: Not on file    Inability: Not on file  . Transportation needs    Medical: Not on file    Non-medical: Not on file  Tobacco Use  . Smoking status: Never Smoker  . Smokeless tobacco: Never Used  Substance and Sexual Activity  . Alcohol use: No  . Drug use: No  . Sexual activity: Yes    Birth control/protection: None  Lifestyle  . Physical activity    Days per  week: Not on file    Minutes per session: Not on file  . Stress: Not on file  Relationships  . Social Musicianconnections    Talks on phone: Not on file    Gets together: Not on file    Attends religious service: Not on file    Active member of club or organization: Not on file    Attends meetings of clubs or organizations: Not on file    Relationship status: Not on file  . Intimate partner violence    Fear of current or ex partner: Not on file    Emotionally abused: Not on file    Physically abused: Not on file    Forced sexual activity: Not on file  Other Topics Concern  . Not on file  Social History Narrative   Drinks 1-2 cups caffeine daily.    Review of Systems  Constitutional: Negative for fatigue and unexpected weight change.  Eyes: Negative for visual disturbance.  Respiratory: Negative for cough, chest tightness and shortness of breath.   Cardiovascular: Negative for chest pain, palpitations and leg swelling.  Gastrointestinal: Negative for abdominal pain and blood in stool.  Neurological: Negative for dizziness, light-headedness and headaches.       Objective:   Physical Exam Vitals signs reviewed.  Constitutional:      Appearance: He is well-developed.  HENT:     Head: Normocephalic and atraumatic.  Eyes:     Pupils: Pupils are equal, round, and reactive to light.  Neck:     Vascular: No carotid bruit or JVD.  Cardiovascular:     Rate and Rhythm: Normal rate and regular rhythm.     Heart sounds: Normal heart sounds. No murmur.  Pulmonary:     Effort: Pulmonary effort is normal.     Breath sounds: Normal breath sounds. No rales.  Skin:    General: Skin is warm and dry.  Neurological:     Mental Status: He is alert and oriented to person, place, and time.    Vitals:   06/28/19 0810 06/28/19 0818  BP: (!) 158/91 (!) 148/91  Pulse: 85   Resp: 16   Temp: 98.3 F (36.8 C)   TempSrc: Oral   SpO2: 97%   Weight: 268 lb (121.6 kg)        Assessment & Plan:    Dustin Mckee is a 63 y.o. male Hyperlipidemia, unspecified hyperlipidemia type - Plan: Comprehensive metabolic panel, Lipid panel, rosuvastatin (CRESTOR) 20 MG tablet  -  Stable, tolerating current regimen. Medications refilled. Labs pending as above.   Essential hypertension - Plan: Comprehensive metabolic panel, hydrochlorothiazide (HYDRODIURIL) 25 MG tablet  -Decreased control.  Options discussed, will initially try higher dose of  hydrochlorothiazide at 25 mg total per day.  Orthostatic/hypotensive precautions discussed.  Plans on weight loss ultimately to help as well.  Benign prostatic hyperplasia, unspecified whether lower urinary tract symptoms present - Plan: tamsulosin (FLOMAX) 0.4 MG CAPS capsule  -Stable, continue Flomax but as minimal to no symptoms may try every other day dosing or occasional stopping dose to see if that is tolerated.  Meds ordered this encounter  Medications  . rosuvastatin (CRESTOR) 20 MG tablet    Sig: Take 1 tablet (20 mg total) by mouth daily.    Dispense:  90 tablet    Refill:  1  . tamsulosin (FLOMAX) 0.4 MG CAPS capsule    Sig: Take 1 capsule (0.4 mg total) by mouth daily.    Dispense:  90 capsule    Refill:  1  . hydrochlorothiazide (HYDRODIURIL) 25 MG tablet    Sig: Take 1 tablet (25 mg total) by mouth daily.    Dispense:  90 tablet    Refill:  1   Patient Instructions    Blood pressure is running higher.  Can try higher dose of hydrochlorothiazide at 25 mg total per day.  I did send a new prescription, but you can double up on your current prescription to see if that is tolerated.  Watch for lightheadedness or dizziness on that new regimen, and if that occurs may need to return to  just 12.5 mg.  Let me know how things are going with a MyChart update in the next few weeks.  Follow-up in office in 6 months.  Thank you for coming in today.    If you have lab work done today you will be contacted with your lab results within the next 2  weeks.  If you have not heard from us then please contact us. The fastest way to get your results is to register for My Chart.   IF you received an x-ray today, you will receive an invoice [facility]. Please contact [facility] at [phone] with questions or concerns regarding your invoice.   IF you received labwork today, you will receive an invoice from [facility]. Please contact [facility] at [phone] with questions or concerns regarding your invoice.   Our billing staff will not be able to assist you with questions regarding bills from these companies.  You will be contacted with the lab results as soon as they are available. The fastest way to get your results is to activate your My Chart account. Instructions are located on the last page of this paperwork. If you have not heard from us regarding the results in 2 weeks, please contact this office.        Signed,   Meredith StaggersJeffrey Jaydence Vanyo, MD [facility].  06/28/19 8:29 AM

## 2019-06-28 NOTE — Patient Instructions (Addendum)
  Blood pressure is running higher.  Can try higher dose of hydrochlorothiazide at 25 mg total per day.  I did send a new prescription, but you can double up on your current prescription to see if that is tolerated.  Watch for lightheadedness or dizziness on that new regimen, and if that occurs may need to return to  just 12.5 mg.  Let me know how things are going with a MyChart update in the next few weeks.  Follow-up in office in 6 months.  Thank you for coming in today.    If you have lab work done today you will be contacted with your lab results within the next 2 weeks.  If you have not heard from [place] then please contact us. The fastest way to get your results is to register for My Chart.   IF you received an x-ray today, you will receive an invoice from [facility]. Please contact [facility] at [phone] with questions or concerns regarding your invoice.   IF you received labwork today, you will receive an invoice from [place]. Please contact [facility] at [phone] with questions or concerns regarding your invoice.   Our billing staff will not be able to assist you with questions regarding bills from these companies.  You will be contacted with the lab results as soon as they are available. The fastest way to get your results is to activate your My Chart account. Instructions are located on the last page of this paperwork. If you have not heard from [place] regarding the results in 2 weeks, please contact this office.

## 2019-07-14 ENCOUNTER — Other Ambulatory Visit: Payer: Self-pay

## 2019-07-14 ENCOUNTER — Ambulatory Visit: Payer: BC Managed Care – PPO | Admitting: Podiatry

## 2019-07-14 ENCOUNTER — Encounter: Payer: Self-pay | Admitting: Podiatry

## 2019-07-14 DIAGNOSIS — B351 Tinea unguium: Secondary | ICD-10-CM | POA: Diagnosis not present

## 2019-07-14 DIAGNOSIS — L600 Ingrowing nail: Secondary | ICD-10-CM

## 2019-07-14 NOTE — Progress Notes (Signed)
Subjective:   Patient ID: Dustin Mckee, male   DOB: 63 y.o.   MRN: [id number]   HPI Patient concerned about some looseness of the hallux nail and also discoloration and states the ingrown that was corrected is doing very well   ROS      Objective:  Physical Exam  Neurovascular status intact with patient's right hallux nail showing distal discoloration but localized with mild looseness of the nailbed noted upon palpation     Assessment:  Overall doing well with mild localized mycotic infection well-healed ingrown toenail site right     Plan:  H&P discussed condition and recommended utilization of formula 7 and debrided the nailbed today and do not recommend removal but did explain it is possible he may lose this nail.  Patient will be seen back to reevaluate

## 2019-07-20 ENCOUNTER — Ambulatory Visit: Payer: BLUE CROSS/BLUE SHIELD | Admitting: Neurology

## 2019-07-21 ENCOUNTER — Encounter: Payer: Self-pay | Admitting: Neurology

## 2019-07-23 NOTE — Telephone Encounter (Signed)
Your demographic data has been sent to the plan successfully. They will respond with your clinical questions and you will be notified by email when available within the next business day. You can also check for an update later by opening this request from your dashboard. Please do not fax or call the plan to resubmit this request.  If you need assistance, please chat with CoverMyMeds or call us at [phone]. 

## 2019-07-26 NOTE — Telephone Encounter (Signed)
Riese Vanorman Key: [id number] - PA Case ID: [id number] Need help? Call us at [phone] Status Sent to [employer] Drug Testosterone 20.25 MG/ACT(1.62%) TD GEL Form [employer] Electronic PA Form (NCPDP) Requested that PA set as urgent.

## 2019-07-27 NOTE — Telephone Encounter (Signed)
Seydou Slingerland Key: [id number] - PA Case ID: [id number] Need help? Call us at [phone] Outcome Approvedtoday Your PA request has been approved. Additional information will be provided in the approval communication. (Message 1145) Drug Testosterone 20.25 MG/ACT(1.62%) TD GEL Form [employer] Electronic PA Form (NCPDP)

## 2019-07-28 DIAGNOSIS — G4733 Obstructive sleep apnea (adult) (pediatric): Secondary | ICD-10-CM | POA: Diagnosis not present

## 2019-07-28 DIAGNOSIS — Z79899 Other long term (current) drug therapy: Secondary | ICD-10-CM | POA: Diagnosis not present

## 2019-07-28 DIAGNOSIS — F902 Attention-deficit hyperactivity disorder, combined type: Secondary | ICD-10-CM | POA: Diagnosis not present

## 2019-07-28 DIAGNOSIS — F419 Anxiety disorder, unspecified: Secondary | ICD-10-CM | POA: Diagnosis not present

## 2019-08-30 ENCOUNTER — Other Ambulatory Visit: Payer: Self-pay

## 2019-08-30 ENCOUNTER — Other Ambulatory Visit (INDEPENDENT_AMBULATORY_CARE_PROVIDER_SITE_OTHER): Payer: BC Managed Care – PPO

## 2019-08-30 DIAGNOSIS — R7301 Impaired fasting glucose: Secondary | ICD-10-CM

## 2019-08-30 DIAGNOSIS — E230 Hypopituitarism: Secondary | ICD-10-CM

## 2019-08-30 LAB — COMPREHENSIVE METABOLIC PANEL
ALT: 32 U/L (ref 0–53)
AST: 23 U/L (ref 0–37)
Albumin: 4.5 g/dL (ref 3.5–5.2)
Alkaline Phosphatase: 40 U/L (ref 39–117)
BUN: 24 mg/dL — ABNORMAL HIGH (ref 6–23)
CO2: 28 mEq/L (ref 19–32)
Calcium: 9.8 mg/dL (ref 8.4–10.5)
Chloride: 102 mEq/L (ref 96–112)
Creatinine, Ser: 1.27 mg/dL (ref 0.40–1.50)
GFR: 69.73 mL/min (ref >60.00)
Glucose, Bld: 105 mg/dL — ABNORMAL HIGH (ref 70–99)
Potassium: 3.6 mEq/L (ref 3.5–5.1)
Sodium: 139 mEq/L (ref 135–145)
Total Bilirubin: 0.6 mg/dL (ref 0.2–1.2)
Total Protein: 7.4 g/dL (ref 6.0–8.3)

## 2019-08-30 LAB — TESTOSTERONE: Testosterone: 127.53 ng/dL — ABNORMAL LOW (ref 300.00–890.00)

## 2019-08-30 LAB — CBC
HCT: 50.8 % (ref 39.0–52.0)
Hemoglobin: 16.8 g/dL (ref 13.0–17.0)
MCHC: 33 g/dL (ref 30.0–36.0)
MCV: 85.5 fl (ref 78.0–100.0)
Platelets: 215 10*3/uL (ref 150.0–400.0)
RBC: 5.94 Mil/uL — ABNORMAL HIGH (ref 4.22–5.81)
RDW: 14.9 % (ref 11.5–15.5)
WBC: 5.6 10*3/uL (ref 4.0–10.5)

## 2019-08-30 LAB — HEMOGLOBIN A1C: Hgb A1c MFr Bld: 6.3 % (ref 4.6–6.5)

## 2019-09-01 NOTE — Progress Notes (Signed)
Patient ID: Dustin Mckee, male   DOB: 11-21-1955, 63 y.o.   MRN: [id number]            Referring physician: Karyl Kinnier   Chief complaint: Low testosterone follow-up  History of Present Illness  Hypogonadismwas diagnosed in 2012 by his previous primary care physician when he was having complaints of fatigue and lethargy He does not know the details about his evaluation and no records are available However he was treated with a testosterone gel for several months with improvement in his symptoms He then stopped treatment on his own  He had had complaints offatigue, some lethargy, decreased motivation, decreased libido for a few months prior to his initial consultation He had testosterone levels levels that were significantly low  With his evaluation 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, prolactin upper normal He had been started on CLOMIPHENE half tablet 3 times a week since early April 2017  RECENT history:  In 9/18 with taking clomiphene 3 times a week follow-up level was up to 326 Subsequently his levels had come down slowly Because of his complaints of  having some decreased energy level and libido, lower motivation level he was switched from clomiphene to testosterone gel 2 pumps daily With this change he was feeling much better with his energy level and libido  Testosterone level had gone down to as low as 137 in 01/2019 and the dose was increased to 3 pumps daily Recently has had fairly good energy level and is able to do physical activities Also libido is relatively better Feels very regular with applying the gel every morning  His prescription was somehow not refilled for 10 days His testosterone level is now 127  However his testosterone level previously was still below 200  His labs showtestosterone levels as below:  Lab Results  Component Value Date   TESTOSTERONE 127.53 (L) 08/30/2019   TESTOSTERONE 196.14 (L) 04/28/2019   TESTOSTERONE 137.28 (L) 01/29/2019   TESTOSTERONE 247 (L) 12/28/2018    Baseline free testosterone level was decreased at 19, normal  >35.8  Prolactin level: 15.4  Lab Results  Component Value Date   LH 6.04 03/20/2018      Diabetes screening: See review of systems     Allergies as of 09/02/2019      Reactions   Iodine Hives      Medication List       Accurate as of September 02, 2019  8:26 AM. If you have any questions, ask your nurse or doctor.        STOP taking these medications   amphetamine-dextroamphetamine 20 MG 24 hr capsule Commonly known as: ADDERALL XR Stopped by: Reather Littler, MD     TAKE these medications   hydrochlorothiazide 25 MG tablet Commonly known as: HYDRODIURIL Take 1 tablet (25 mg total) by mouth daily.   neomycin-polymyxin-hydrocortisone OTIC solution Commonly known as: CORTISPORIN Apply 1 to 2 drops to toe BID after soaking   rosuvastatin 20 MG tablet Commonly known as: CRESTOR Take 1 tablet (20 mg total) by mouth daily.   tamsulosin 0.4 MG Caps capsule Commonly known as: FLOMAX Take 1 capsule (0.4 mg total) by mouth daily.   Testosterone 20.25 MG/ACT (1.62%) Gel Apply 1 pump on one shoulder and 2 on the other once daily and allow to dry       Allergies:  Allergies  Allergen Reactions  . Iodine Hives    Past Medical History:  Diagnosis  Date  . Allergy   . Hyperlipidemia   . Hypertension   . Sleep apnea    CPAP    Past Surgical History:  Procedure Laterality Date  . APPENDECTOMY    . TOOTH EXTRACTION    . VASECTOMY      Family History  Problem Relation Age of Onset  . Heart disease Mother   . Kidney disease Mother   . Heart disease Father        defibrillator/CHF.  . Diabetes Father   . Heart disease Sister        CHF  . Diabetes Sister   . Heart disease Sister        CHF    Social History:  reports that he has never smoked. He has never used smokeless  tobacco. He reports that he does not drink alcohol or use drugs.  Review of Systems  He has had prediabetes with fasting glucose 105 A1c appears to be gradually increasing and now 6.3  Glucose was 105  He is trying to be active but not doing any formal exercise He has not lost any weight He is not modifying his diet much but avoiding regular soft drinks  His maximum weight has been about 290, his weight was recorded as 284 in 04/2016 Family history of diabetes   Wt Readings from Last 3 Encounters:  09/02/19 270 lb 6.4 oz (122.7 kg)  06/28/19 268 lb (121.6 kg)  04/30/19 271 lb 3.2 oz (123 kg)   Lab Results  Component Value Date   HGBA1C 6.3 08/30/2019   HGBA1C 5.9 01/29/2019   HGBA1C 5.7 03/20/2018   Lab Results  Component Value Date   LDLCALC 69 06/28/2019   CREATININE 1.27 08/30/2019    Vitamin D deficiency: Has been supplemented adequately  General Examination:   BP 140/72 (BP Location: Left Arm, Patient Position: Sitting, Cuff Size: Normal)   Pulse 90   Ht 6\' 3"  (1.905 m)   Wt 270 lb 6.4 oz (122.7 kg)   SpO2 96%   BMI 33.80 kg/m    Assessment/ Plan:  Hypogonadotropic Hypogonadism, 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 about 2012  He has subjectively done better with testosterone gel compared to clomiphene Currently he is doing subjectively well with energy level and libido Does not have any decreased capacity to do physical activities as before  This is despite his testosterone levels still being subtherapeutic Currently his level is 127 because of running out of his testosterone gel about 10 days ago However his hemoglobin continues to stay high normal  For now will not change his regimen since he subjectively doing better We will have him come back for testosterone level to be rechecked in about 5 weeks   Impaired fasting glucose: Although his glucose is only 105 fasting his A1c is 6.3 and this was discussed He does  need to cut back on high-calorie foods, carbohydrates and start regular walking or other exercise program He is planning to have a general inside his garage at home  Total visit time 15 minutes  Elayne Snare 09/02/2019, 8:26 AM

## 2019-09-02 ENCOUNTER — Encounter: Payer: Self-pay | Admitting: Endocrinology

## 2019-09-02 ENCOUNTER — Ambulatory Visit (INDEPENDENT_AMBULATORY_CARE_PROVIDER_SITE_OTHER): Payer: BC Managed Care – PPO | Admitting: Endocrinology

## 2019-09-02 ENCOUNTER — Other Ambulatory Visit: Payer: Self-pay

## 2019-09-02 VITALS — BP 140/72 | HR 90 | Ht 75.0 in | Wt 270.4 lb

## 2019-09-02 DIAGNOSIS — E230 Hypopituitarism: Secondary | ICD-10-CM

## 2019-09-02 DIAGNOSIS — R7301 Impaired fasting glucose: Secondary | ICD-10-CM

## 2019-09-02 MED ORDER — TESTOSTERONE 20.25 MG/ACT (1.62%) TD GEL: TRANSDERMAL | 2 refills | Status: DC

## 2019-09-02 NOTE — Patient Instructions (Signed)
Refill Rx on time

## 2019-09-27 DIAGNOSIS — G4733 Obstructive sleep apnea (adult) (pediatric): Secondary | ICD-10-CM | POA: Diagnosis not present

## 2019-10-04 ENCOUNTER — Other Ambulatory Visit: Payer: Self-pay

## 2019-10-04 ENCOUNTER — Other Ambulatory Visit (INDEPENDENT_AMBULATORY_CARE_PROVIDER_SITE_OTHER): Payer: BC Managed Care – PPO

## 2019-10-04 DIAGNOSIS — E230 Hypopituitarism: Secondary | ICD-10-CM

## 2019-10-07 LAB — TESTOSTERONE, TOTAL, LC/MS/MS: Testosterone, Total, LC-MS-MS: 330 ng/dL (ref 250–1100)

## 2019-10-07 NOTE — Progress Notes (Signed)
Please call to let patient know that the lab results are normal and no change needed

## 2019-10-16 DIAGNOSIS — Z23 Encounter for immunization: Secondary | ICD-10-CM | POA: Diagnosis not present

## 2019-10-27 DIAGNOSIS — G4733 Obstructive sleep apnea (adult) (pediatric): Secondary | ICD-10-CM | POA: Diagnosis not present

## 2019-10-27 DIAGNOSIS — Z79899 Other long term (current) drug therapy: Secondary | ICD-10-CM | POA: Diagnosis not present

## 2019-10-27 DIAGNOSIS — F419 Anxiety disorder, unspecified: Secondary | ICD-10-CM | POA: Diagnosis not present

## 2019-10-27 DIAGNOSIS — F902 Attention-deficit hyperactivity disorder, combined type: Secondary | ICD-10-CM | POA: Diagnosis not present

## 2019-11-29 ENCOUNTER — Telehealth: Payer: Self-pay

## 2019-11-29 NOTE — Telephone Encounter (Signed)
PA initiated via [email or web address] for Testosterone.  Dustin Mckee Dustin Mckee - Rx #: [id number] Need help? Call us at [phone] Outcome Additional Information Required Your PA has been resolved, no additional PA is required. For further inquiries please contact the number on the back of the member prescription card. (Message [phone]) Drug Testosterone 20.25 MG/ACT(1.62%) gel Form [facility]ulance person PA Form (NCPDP) Original Claim Info 75 PRIOR AUTH REQ-MD CALL [phone].DRUG REQUIRES PRIOR AUTHORIZATION(PHARMACY HELP DESK [phone])  When clinical questions are available, PA will be completed.

## 2019-12-25 ENCOUNTER — Other Ambulatory Visit: Payer: Self-pay | Admitting: Endocrinology

## 2019-12-25 DIAGNOSIS — E230 Hypopituitarism: Secondary | ICD-10-CM

## 2019-12-25 DIAGNOSIS — R7301 Impaired fasting glucose: Secondary | ICD-10-CM

## 2019-12-27 ENCOUNTER — Other Ambulatory Visit (INDEPENDENT_AMBULATORY_CARE_PROVIDER_SITE_OTHER): Payer: 59

## 2019-12-27 ENCOUNTER — Other Ambulatory Visit: Payer: Self-pay

## 2019-12-27 DIAGNOSIS — E230 Hypopituitarism: Secondary | ICD-10-CM

## 2019-12-27 DIAGNOSIS — R7301 Impaired fasting glucose: Secondary | ICD-10-CM

## 2019-12-27 LAB — CBC
HCT: 47.9 % (ref 39.0–52.0)
Hemoglobin: 16.3 g/dL (ref 13.0–17.0)
MCHC: 34 g/dL (ref 30.0–36.0)
MCV: 86.2 fl (ref 78.0–100.0)
Platelets: 224 10*3/uL (ref 150.0–400.0)
RBC: 5.56 Mil/uL (ref 4.22–5.81)
RDW: 14.9 % (ref 11.5–15.5)
WBC: 5.2 10*3/uL (ref 4.0–10.5)

## 2019-12-27 LAB — GLUCOSE, RANDOM: Glucose, Bld: 108 mg/dL — ABNORMAL HIGH (ref 70–99)

## 2019-12-27 LAB — TESTOSTERONE: Testosterone: 302.16 ng/dL (ref 300.00–890.00)

## 2019-12-30 ENCOUNTER — Encounter: Payer: BC Managed Care – PPO | Admitting: Family Medicine

## 2019-12-31 ENCOUNTER — Telehealth: Payer: Self-pay

## 2019-12-31 NOTE — Telephone Encounter (Signed)
PA initiated via [email or web address] for Testosterone 20.25mg /ACT (1.62%) gel.   [name] KeyEnedina Finner - Rx #: [id number] Need help? Call us at [phone] Status Sent to [facility] Drug Testosterone 20.25 MG/ACT(1.62%) gel Form Ambulance person PA Form (NCPDP) Original Claim Info 75 PRIOR AUTH REQ-MD CALL [phone].DRUG REQUIRES PRIOR AUTHORIZATION(PHARMACY HELP DESK [phone])

## 2020-01-02 NOTE — Progress Notes (Signed)
Patient ID: Dustin Mckee, male   DOB: Jan 14, 1956, 64 y.o.   MRN: [id number]            Referring physician: Cindee Lame   Chief complaint: Endocrinology follow-up  History of Present Illness  Hypogonadism: Diagnosed in 2012 by his previous primary care physician when he was having complaints of fatigue and lethargy He does not know the details about his evaluation and no records are available However he was treated with a testosterone gel for several months with improvement in his symptoms He then stopped treatment on his own  He had had complaints offatigue, some lethargy, decreased motivation, decreased libido for a few months prior to his initial consultation He had testosterone levels levels that were significantly low  With his evaluation 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, prolactin upper normal He had been on CLOMIPHENE half tablet 3 times a week since early April 2017  RECENT history:  In 9/18 with taking clomiphene 3 times a week follow-up level was up to 326  Because of his complaints of  having some decreased energy level and libido, lower motivation level he was switched from clomiphene to testosterone gel 2 pumps daily With this change he was feeling much better with his energy level and libido  Testosterone level has gone down to as low as 127 in the past Now taking AndroGel 1.62% 3 pumps daily Testosterone level has been consistently low in 2020 Despite this he had previously fairly good energy level   Since on his last visit his low level was related to being out of his medication low dose was not changed He is now coming back for follow-up He says he is feeling good with no fatigue, lethargy or decreased libido Has been very regular with his testosterone applications, 2 pumps on one arm and 1 on the other every morning  Testosterone level now is back to normal  His labs showtestosterone  levels as below:  Lab Results  Component Value Date   TESTOSTERONE 302.16 12/27/2019   TESTOSTERONE 127.53 (L) 08/30/2019   TESTOSTERONE 196.14 (L) 04/28/2019   TESTOSTERONE 137.28 (L) 01/29/2019    Baseline free testosterone level was decreased at 19, normal  >35.8  Prolactin level: 15.4  Lab Results  Component Value Date   LH 6.04 03/20/2018      Diabetes screening: See review of systems     Allergies as of 01/03/2020      Reactions   Iodine Hives      Medication List       Accurate as of January 03, 2020  8:27 AM. If you have any questions, ask your nurse or doctor.        hydrochlorothiazide 25 MG tablet Commonly known as: HYDRODIURIL Take 1 tablet (25 mg total) by mouth daily.   neomycin-polymyxin-hydrocortisone OTIC solution Commonly known as: CORTISPORIN Apply 1 to 2 drops to toe BID after soaking   rosuvastatin 20 MG tablet Commonly known as: CRESTOR Take 1 tablet (20 mg total) by mouth daily.   tamsulosin 0.4 MG Caps capsule Commonly known as: FLOMAX Take 1 capsule (0.4 mg total) by mouth daily.   Testosterone 20.25 MG/ACT (1.62%) Gel Apply 1 pump on one shoulder and 2 on the other once daily and allow to dry       Allergies:  Allergies  Allergen Reactions  . Iodine Hives    Past Medical History:  Diagnosis Date  . Allergy   .  Hyperlipidemia   . Hypertension   . Sleep apnea    CPAP    Past Surgical History:  Procedure Laterality Date  . APPENDECTOMY    . TOOTH EXTRACTION    . VASECTOMY      Family History  Problem Relation Age of Onset  . Heart disease Mother   . Kidney disease Mother   . Heart disease Father        defibrillator/CHF.  . Diabetes Father   . Heart disease Sister        CHF  . Diabetes Sister   . Heart disease Sister        CHF    Social History:  reports that he has never smoked. He has never used smokeless tobacco. He reports that he does not drink alcohol or use drugs.  Review of Systems   He has had prediabetes with persistently high fasting glucose which is now 108 compared to 105 A1c was higher in 2020  He is only starting to exercise this year Previously likely had gained weight in November and December Has not had any nutritional counseling  His maximum weight has been about 290 He is due to see his PCP soon for follow-up  Family history of diabetes   Wt Readings from Last 3 Encounters:  01/03/20 271 lb 3.2 oz (123 kg)  09/02/19 270 lb 6.4 oz (122.7 kg)  06/28/19 268 lb (121.6 kg)   Lab Results  Component Value Date   HGBA1C 6.3 08/30/2019   HGBA1C 5.9 01/29/2019   HGBA1C 5.7 03/20/2018   Lab Results  Component Value Date   LDLCALC 69 06/28/2019   CREATININE 1.27 08/30/2019     General Examination:   BP 140/80 (BP Location: Left Arm, Patient Position: Sitting, Cuff Size: Normal)   Pulse 83   Ht 6\' 3"  (1.905 m)   Wt 271 lb 3.2 oz (123 kg)   SpO2 97%   BMI 33.90 kg/m    Assessment/ Plan:  Hypogonadotropic Hypogonadism, 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 about 2012  He has good control of his symptoms with testosterone supplementation using AndroGel now His energy level does not appear to correlate with his lab results However with regular administration of the AndroGel every day his testosterone level is finally back to normal Discussed the technique of applying the gel and need for using it every day No change in hemoglobin with this  He will continue the same regimen of using AndroGel 3 pumps total daily  Impaired fasting glucose: This is persistent He understand the need for better efforts to lose weight consistently and improve diet  Follow-up with his PCP regarding follow-up A1c and chemistry panel  Hypertension: Currently managed with HCTZ alone and because of his prediabetes and low normal potassium this can be optimized, will defer to PCP    2013 01/03/2020, 8:27 AM

## 2020-01-03 ENCOUNTER — Ambulatory Visit (INDEPENDENT_AMBULATORY_CARE_PROVIDER_SITE_OTHER): Payer: 59 | Admitting: Endocrinology

## 2020-01-03 ENCOUNTER — Other Ambulatory Visit: Payer: Self-pay

## 2020-01-03 ENCOUNTER — Encounter: Payer: Self-pay | Admitting: Endocrinology

## 2020-01-03 VITALS — BP 140/80 | HR 83 | Ht 75.0 in | Wt 271.2 lb

## 2020-01-03 DIAGNOSIS — E230 Hypopituitarism: Secondary | ICD-10-CM | POA: Diagnosis not present

## 2020-01-03 DIAGNOSIS — R7301 Impaired fasting glucose: Secondary | ICD-10-CM

## 2020-01-14 ENCOUNTER — Ambulatory Visit: Payer: 59 | Attending: Internal Medicine

## 2020-01-14 DIAGNOSIS — Z23 Encounter for immunization: Secondary | ICD-10-CM

## 2020-01-14 NOTE — Progress Notes (Signed)
   Covid-19 Vaccination Clinic  Name:  Dustin Mckee    MRN: [id number] DOB: [birth year]  01/14/2020  Dustin Mckee was observed post Covid-19 immunization for 15 minutes without incident. He was provided with Vaccine Information Sheet and instruction to access the V-Safe system.   Dustin Mckee was instructed to call 911 with any severe reactions post vaccine: Marland Kitchen Difficulty breathing  . Swelling of face and throat  . A fast heartbeat  . A bad rash all over body  . Dizziness and weakness

## 2020-01-17 ENCOUNTER — Ambulatory Visit (INDEPENDENT_AMBULATORY_CARE_PROVIDER_SITE_OTHER): Payer: 59 | Admitting: Family Medicine

## 2020-01-17 ENCOUNTER — Other Ambulatory Visit: Payer: Self-pay

## 2020-01-17 ENCOUNTER — Encounter: Payer: Self-pay | Admitting: Family Medicine

## 2020-01-17 VITALS — BP 144/86 | HR 80 | Temp 97.9°F | Ht 75.0 in | Wt 270.0 lb

## 2020-01-17 DIAGNOSIS — N400 Enlarged prostate without lower urinary tract symptoms: Secondary | ICD-10-CM | POA: Diagnosis not present

## 2020-01-17 DIAGNOSIS — E785 Hyperlipidemia, unspecified: Secondary | ICD-10-CM

## 2020-01-17 DIAGNOSIS — Z0001 Encounter for general adult medical examination with abnormal findings: Secondary | ICD-10-CM

## 2020-01-17 DIAGNOSIS — I10 Essential (primary) hypertension: Secondary | ICD-10-CM | POA: Diagnosis not present

## 2020-01-17 DIAGNOSIS — R739 Hyperglycemia, unspecified: Secondary | ICD-10-CM

## 2020-01-17 DIAGNOSIS — Z0000 Encounter for general adult medical examination without abnormal findings: Secondary | ICD-10-CM

## 2020-01-17 MED ORDER — AMLODIPINE BESYLATE 2.5 MG PO TABS: 2.5000 mg | ORAL_TABLET | Freq: Every day | ORAL | 1 refills | Status: DC

## 2020-01-17 MED ORDER — ROSUVASTATIN CALCIUM 20 MG PO TABS: 20.0000 mg | ORAL_TABLET | Freq: Every day | ORAL | 1 refills | Status: DC

## 2020-01-17 MED ORDER — HYDROCHLOROTHIAZIDE 25 MG PO TABS: 25.0000 mg | ORAL_TABLET | Freq: Every day | ORAL | 1 refills | Status: DC

## 2020-01-17 MED ORDER — TAMSULOSIN HCL 0.4 MG PO CAPS: 0.4000 mg | ORAL_CAPSULE | Freq: Every day | ORAL | 1 refills | Status: DC

## 2020-01-17 NOTE — Progress Notes (Signed)
Subjective:  Patient ID: Dustin Mckee, male    DOB: Oct 22, 1956  Age: 64 y.o. MRN: [id number]  CC:  Chief Complaint  Patient presents with  . Annual Exam    Pt states he feels good today with no complants.pt states other than his distolic pressure being in the 140's he hasn't had any issues with his BP. Pt stated he had a headach last week and pt states he normaly dosn't have headaches. pt did resently change his dieit a few weeks ago.    HPI Dustin Mckee presents for   Annual physical exam, follow-up chronic medical conditions.  Fasting labs this am.   Hypertension: Few elevated home readings in the 140s.  Headache last week, not currently, usually no headaches.  Currently taking HCTZ 25 mg daily.  Takes testosterone, managed by endocrinology. History of obstructive sleep apnea - using CPAP nightly. Feels well rested. Due for follow up with sleep specialist.  Home readings: as above in 140's. Past 6 months.  Rare missed dose.  BP Readings from Last 3 Encounters:  01/17/20 (!) 144/86  01/03/20 140/80  09/02/19 140/72   Lab Results  Component Value Date   CREATININE 1.27 08/30/2019   Hyperlipidemia: Crestor 20 mg daily. No new myalgias/side effects.   Lab Results  Component Value Date   CHOL 128 06/28/2019   HDL 44 06/28/2019   LDLCALC 69 06/28/2019   TRIG 76 06/28/2019   CHOLHDL 2.9 06/28/2019   Lab Results  Component Value Date   ALT 32 08/30/2019   AST 23 08/30/2019   ALKPHOS 40 08/30/2019   BILITOT 0.6 08/30/2019   BPH: flomax 0.4mg  qd in past. Ran out past week - no new urinary symptoms. Urgency/incomplete emptying in past.   Hyperglycemia: 108 on 2/15 at endocrine - not sure he was fasting.   Cancer screening: Colonoscopy 10/14/18.  Prostate:hx of BPH only. No FH of prostate CA. After risks and benefits of testing- he chose to check PSA only - limitations without DRE discussed.  Lab Results  Component Value Date   PSA1 3.0 12/28/2018   PSA1 2.8  09/08/2017   PSA1 2.4 11/08/2016   PSA 2.77 08/23/2015   PSA 2.10 06/11/2014   Immunization History  Administered Date(s) Administered  . Influenza,inj,Quad PF,6+ Mos 08/23/2015, 11/18/2016, 09/08/2017, 12/28/2018  . PFIZER SARS-COV-2 Vaccination 01/14/2020  . Tdap 06/11/2014  shingrix at pharmacy - last year.    Depression screen [facility] 2/9 01/17/2020 06/28/2019 05/22/2018 05/20/2018 09/08/2017  Decreased Interest 0 0 0 0 0  Down, Depressed, Hopeless 0 0 0 0 0  PHQ - 2 Score 0 0 0 0 0    Hearing Screening   125Hz  250Hz  500Hz  1000Hz  2000Hz  3000Hz  4000Hz  6000Hz  8000Hz   Right ear:           Left ear:             Visual Acuity Screening   Right eye Left eye Both eyes  Without correction:     With correction: 20/15 20/13-2 20/13-1  optho visit last year. Due for exam.   Dental:  Every 6 months.   Exercise, BMI 33.  Starting to increase with recent elevated glucose. Starting with walking, push ups, light weight, Stationary bike.       History Patient Active Problem List   Diagnosis Date Noted  . Dysuria 05/20/2018  . Acute UTI 05/20/2018  . Urinary frequency 05/20/2018  . Obstructive sleep apnea treated with continuous positive airway pressure (CPAP) 01/13/2018  .  Sinusitis chronic, frontal 10/15/2017  . Excessive daytime sleepiness 10/15/2017  . Seasonal allergic rhinitis due to pollen 10/15/2017  . Hyperlipidemia 09/05/2014  . Vitamin D deficiency 06/14/2014  . Benign prostatic hyperplasia with urinary frequency 06/14/2014  . GERD 01/23/2009  . HYPERLIPIDEMIA 12/14/2008  . SLEEP APNEA 11/29/2008   Past Medical History:  Diagnosis Date  . Allergy   . Hyperlipidemia   . Hypertension   . Sleep apnea    CPAP   Past Surgical History:  Procedure Laterality Date  . APPENDECTOMY    . TOOTH EXTRACTION    . VASECTOMY     Allergies  Allergen Reactions  . Iodine Hives   Prior to Admission medications   Medication Sig Start Date End Date Taking? Authorizing  Provider  hydrochlorothiazide (HYDRODIURIL) 25 MG tablet Take 1 tablet (25 mg total) by mouth daily. 06/28/19  Yes Wendie Agreste, MD  neomycin-polymyxin-hydrocortisone (CORTISPORIN) OTIC solution Apply 1 to 2 drops to toe BID after soaking 05/26/19  Yes Regal, Tamala Fothergill, DPM  rosuvastatin (CRESTOR) 20 MG tablet Take 1 tablet (20 mg total) by mouth daily. 06/28/19  Yes Wendie Agreste, MD  tamsulosin (FLOMAX) 0.4 MG CAPS capsule Take 1 capsule (0.4 mg total) by mouth daily. 06/28/19  Yes Wendie Agreste, MD  Testosterone 20.25 MG/ACT (1.62%) GEL Apply 1 pump on one shoulder and 2 on the other once daily and allow to dry 09/02/19  Yes Elayne Snare, MD   Social History   Socioeconomic History  . Marital status: Married    Spouse name: Neoma Laming  . Number of children: Not on file  . Years of education: Not on file  . Highest education level: Not on file  Occupational History  . Not on file  Tobacco Use  . Smoking status: Never Smoker  . Smokeless tobacco: Never Used  Substance and Sexual Activity  . Alcohol use: No  . Drug use: No  . Sexual activity: Yes    Birth control/protection: None  Other Topics Concern  . Not on file  Social History Narrative   Drinks 1-2 cups caffeine daily.   Social Determinants of Health   Financial Resource Strain:   . Difficulty of Paying Living Expenses: Not on file  Food Insecurity:   . Worried About Charity fundraiser in the Last Year: Not on file  . Ran Out of Food in the Last Year: Not on file  Transportation Needs:   . Lack of Transportation (Medical): Not on file  . Lack of Transportation (Non-Medical): Not on file  Physical Activity:   . Days of Exercise per Week: Not on file  . Minutes of Exercise per Session: Not on file  Stress:   . Feeling of Stress : Not on file  Social Connections:   . Frequency of Communication with Friends and Family: Not on file  . Frequency of Social Gatherings with Friends and Family: Not on file  . Attends  Religious Services: Not on file  . Active Member of Clubs or Organizations: Not on file  . Attends Archivist Meetings: Not on file  . Marital Status: Not on file  Intimate Partner Violence:   . Fear of Current or Ex-Partner: Not on file  . Emotionally Abused: Not on file  . Physically Abused: Not on file  . Sexually Abused: Not on file    Review of Systems  13 point review of systems per patient health survey noted.  Negative other than as indicated above  or in HPI.   Objective:   Vitals:   01/17/20 1326  BP: (!) 144/86  Pulse: 80  Temp: 97.9 F (36.6 C)  TempSrc: Temporal  SpO2: 96%  Weight: 270 lb (122.5 kg)  Height: 6\' 3"  (1.905 m)     Physical Exam Vitals reviewed.  Constitutional:      Appearance: He is well-developed.  HENT:     Head: Normocephalic and atraumatic.     Right Ear: External ear normal.     Left Ear: External ear normal.  Eyes:     Conjunctiva/sclera: Conjunctivae normal.     Pupils: Pupils are equal, round, and reactive to light.  Neck:     Thyroid: No thyromegaly.  Cardiovascular:     Rate and Rhythm: Normal rate and regular rhythm.     Heart sounds: Normal heart sounds.  Pulmonary:     Effort: Pulmonary effort is normal. No respiratory distress.     Breath sounds: Normal breath sounds. No wheezing.  Abdominal:     General: There is no distension.     Palpations: Abdomen is soft.     Tenderness: There is no abdominal tenderness.     Hernia: There is no hernia in the left inguinal area or right inguinal area.  Musculoskeletal:        General: No tenderness. Normal range of motion.     Cervical back: Normal range of motion and neck supple.  Lymphadenopathy:     Cervical: No cervical adenopathy.  Skin:    General: Skin is warm and dry.  Neurological:     Mental Status: He is alert and oriented to person, place, and time.     Deep Tendon Reflexes: Reflexes are normal and symmetric.  Psychiatric:        Mood and Affect: Mood  normal.        Behavior: Behavior normal.     Assessment & Plan:  Dustin Mckee is a 64 y.o. male . Annual physical exam  - -anticipatory guidance as below in AVS, screening labs above. Health maintenance items as above in HPI discussed/recommended as applicable.   Essential hypertension - Plan: hydrochlorothiazide (HYDRODIURIL) 25 MG tablet, amLODipine (NORVASC) 2.5 MG tablet  - add amlodipine for improved control. Continue HCTZ, labs above rtc precautions.   Hyperlipidemia, unspecified hyperlipidemia type - Plan: rosuvastatin (CRESTOR) 20 MG tablet, Comprehensive metabolic panel, Lipid panel  -Tolerating Crestor continue same, check labs.  Benign prostatic hyperplasia without lower urinary tract symptoms Benign prostatic hyperplasia, unspecified whether lower urinary tract symptoms present - Plan: tamsulosin (FLOMAX) 0.4 MG CAPS capsule, PSA  -Asymptomatic at present.  Option of restarting Flomax, printed.  Check PSA. . -We discussed pros and cons of prostate cancer screening, and after this discussion, he chose to have screening done. PSA obtained only.  Hyperglycemia - Plan: Hemoglobin A1c  -May not have been fasting with recent labs endocrinology, check A1c  No orders of the defined types were placed in this encounter.  Patient Instructions       If you have lab work done today you will be contacted with your lab results within the next 2 weeks.  If you have not heard from 77 then please contact us. The fastest way to get your results is to register for My Chart.   IF you received an x-ray today, you will receive an invoice from [facility]. Please contact [facility] at [phone] with questions or concerns regarding your invoice.   IF you received labwork  today, you will receive an invoice from [place]. Please contact [facility] at [phone] with questions or concerns regarding your invoice.   Our billing staff will not be able to assist you with  questions regarding bills from these companies.  You will be contacted with the lab results as soon as they are available. The fastest way to get your results is to activate your My Chart account. Instructions are located on the last page of this paperwork. If you have not heard from [place] regarding the results in 2 weeks, please contact this office.         Signed, Merri Ray, MD [facility]

## 2020-01-17 NOTE — Patient Instructions (Addendum)
Add amlodipine once per day. Continue the hydrochlorothiazide.   IF return of prostate symptoms, then restart flomax.  Start low, and go slow with exercise restart. Ultimate goal per week.   Keeping you healthy  Get these tests  Blood pressure- Have your blood pressure checked once a year by your healthcare provider.  Normal blood pressure is 120/80  Weight- Have your body mass index (BMI) calculated to screen for obesity.  BMI is a measure of body fat based on height and weight. You can also calculate your own BMI at [email or web address].  Cholesterol- Have your cholesterol checked every year.  Diabetes- Have your blood sugar checked regularly if you have high blood pressure, high cholesterol, have a family history of diabetes or if you are overweight.  Screening for Colon Cancer- Colonoscopy starting at age 34.  Screening may begin sooner depending on your family history and other health conditions. Follow up colonoscopy as directed by your Gastroenterologist.  Screening for Prostate Cancer- Both blood work (PSA) and a rectal exam help screen for Prostate Cancer.  Screening begins at age 19 with African-American men and at age 73 with Caucasian men.  Screening may begin sooner depending on your family history.  Take these medicines  Aspirin- One aspirin daily can help prevent Heart disease and Stroke.  Flu shot- Every fall.  Tetanus- Every 10 years.  Zostavax- Once after the age of 90 to prevent Shingles.  Pneumonia shot- Once after the age of 84; if you are younger than 78, ask your healthcare provider if you need a Pneumonia shot.  Take these steps  Don't smoke- If you do smoke, talk to your doctor about quitting.  For tips on how to quit, go to www.smokefree.gov or call 1-800-QUIT-NOW.  Be physically active- Exercise 5 days a week for at least 30 minutes.  If you are not already physically active start slow and gradually work up to 30 minutes of moderate physical  activity.  Examples of moderate activity include walking briskly, mowing the yard, dancing, swimming, bicycling, etc.  Eat a healthy diet- Eat a variety of healthy food such as fruits, vegetables, low fat milk, low fat cheese, yogurt, lean meant, poultry, fish, beans, tofu, etc. For more information go to www.thenutritionsource.org  Drink alcohol in moderation- Limit alcohol intake to less than two drinks a day. Never drink and drive.  Dentist- Brush and floss twice daily; visit your dentist twice a year.  Depression- Your emotional health is as important as your physical health. If you're feeling down, or losing interest in things you would normally enjoy please talk to your healthcare provider.  Eye exam- Visit your eye doctor every year.  Safe sex- If you may be exposed to a sexually transmitted infection, use a condom.  Seat belts- Seat belts can save your life; always wear one.  Smoke/Carbon Monoxide detectors- These detectors need to be installed on the appropriate level of your home.  Replace batteries at least once a year.  Skin cancer- When out in the sun, cover up and use sunscreen 15 SPF or higher.  Violence- If anyone is threatening you, please tell your healthcare provider.  Living Will/ Health care power of attorney- Speak with your healthcare provider and family. If you have lab work done today you will be contacted with your lab results within the next 2 weeks.  If you have not heard from [place] then please contact us. The fastest way to get your results is to register for My Chart.  IF you received an x-ray today, you will receive an invoice from [facility]. Please contact [facility] at [phone] with questions or concerns regarding your invoice.   IF you received labwork today, you will receive an invoice from [place]. Please contact [facility] at [phone] with questions or concerns regarding your invoice.   Our billing staff will not be able to  assist you with questions regarding bills from these companies.  You will be contacted with the lab results as soon as they are available. The fastest way to get your results is to activate your My Chart account. Instructions are located on the last page of this paperwork. If you have not heard from [place] regarding the results in 2 weeks, please contact this office.

## 2020-01-18 LAB — LIPID PANEL
Chol/HDL Ratio: 3.6 ratio (ref 0.0–5.0)
Cholesterol, Total: 159 mg/dL (ref 100–199)
HDL: 44 mg/dL (ref >39)
LDL Chol Calc (NIH): 79 mg/dL (ref 0–99)
Triglycerides: 213 mg/dL — ABNORMAL HIGH (ref 0–149)
VLDL Cholesterol Cal: 36 mg/dL (ref 5–40)

## 2020-01-18 LAB — COMPREHENSIVE METABOLIC PANEL
ALT: 33 IU/L (ref 0–44)
AST: 28 IU/L (ref 0–40)
Albumin/Globulin Ratio: 2 (ref 1.2–2.2)
Albumin: 4.7 g/dL (ref 3.8–4.8)
Alkaline Phosphatase: 45 IU/L (ref 39–117)
BUN/Creatinine Ratio: 12 (ref 10–24)
BUN: 14 mg/dL (ref 8–27)
Bilirubin Total: 0.5 mg/dL (ref 0.0–1.2)
CO2: 25 mmol/L (ref 20–29)
Calcium: 9.4 mg/dL (ref 8.6–10.2)
Chloride: 101 mmol/L (ref 96–106)
Creatinine, Ser: 1.2 mg/dL (ref 0.76–1.27)
GFR calc Af Amer: 75 mL/min/{1.73_m2} (ref >59)
GFR calc non Af Amer: 65 mL/min/{1.73_m2} (ref >59)
Globulin, Total: 2.4 g/dL (ref 1.5–4.5)
Glucose: 103 mg/dL — ABNORMAL HIGH (ref 65–99)
Potassium: 3.9 mmol/L (ref 3.5–5.2)
Sodium: 140 mmol/L (ref 134–144)
Total Protein: 7.1 g/dL (ref 6.0–8.5)

## 2020-01-18 LAB — HEMOGLOBIN A1C
Est. average glucose Bld gHb Est-mCnc: 117 mg/dL
Hgb A1c MFr Bld: 5.7 % — ABNORMAL HIGH (ref 4.8–5.6)

## 2020-01-18 LAB — PSA: Prostate Specific Ag, Serum: 3.9 ng/mL (ref 0.0–4.0)

## 2020-01-26 ENCOUNTER — Telehealth: Payer: Self-pay

## 2020-01-26 NOTE — Telephone Encounter (Signed)
PA initiated via [email or web address] for Testosterone.   [name] Key[facility]ie Clear - Rx #: [id number] Need help? Call us at [phone] Status Sent to [employer] Drug Testosterone 20.25 MG/ACT(1.62%) gel Form Ambulance person PA Form (NCPDP) Original Claim Info 75 PRIOR AUTH REQ-MD CALL [phone].DRUG REQUIRES PRIOR AUTHORIZATION(PHARMACY HELP DESK [phone])  Outcome Additional Information Required Your PA has been resolved, no additional PA is required. For further inquiries please contact the number on the back of the member prescription card. (Message [phone])

## 2020-02-10 NOTE — Telephone Encounter (Signed)
PA has bee renewed.  Earl [employer] [place] Key: [id number] help? Call us at [phone] Outcome Additional Information Required Your PA has been resolved, no additional PA is required. For further inquiries please contact the number on the back of the member prescription card. (Message [phone]) Drug Testosterone 20.25 MG/ACT(1.62%) TD GEL Form [employer] Electronic PA Form [phone] NCPDP)

## 2020-02-14 ENCOUNTER — Ambulatory Visit: Payer: 59

## 2020-02-15 ENCOUNTER — Ambulatory Visit: Payer: 59 | Attending: Internal Medicine

## 2020-02-15 DIAGNOSIS — Z23 Encounter for immunization: Secondary | ICD-10-CM

## 2020-02-15 NOTE — Progress Notes (Signed)
   Covid-19 Vaccination Clinic  Name:  Dustin Mckee    MRN: [id number] DOB: [birth year]  02/15/2020  [name] was observed post Covid-19 immunization for 15 minutes without incident. He was provided with Vaccine Information Sheet and instruction to access the V-Safe system.   [name] was instructed to call 911 with any severe reactions post vaccine: Marland Kitchen Difficulty breathing  . Swelling of face and throat  . A fast heartbeat  . A bad rash all over body  . Dizziness and weakness   Immunizations Administered    Name Date Dose VIS Date Route   Pfizer COVID-19 Vaccine 02/15/2020 11:44 AM 0.3 mL 10/22/2019 Intramuscular   Manufacturer: [employer], [employer]   Lot: [id number]   NDC: [address]-9

## 2020-06-29 ENCOUNTER — Other Ambulatory Visit (INDEPENDENT_AMBULATORY_CARE_PROVIDER_SITE_OTHER): Payer: No Typology Code available for payment source

## 2020-06-29 ENCOUNTER — Other Ambulatory Visit: Payer: Self-pay

## 2020-06-29 DIAGNOSIS — R7301 Impaired fasting glucose: Secondary | ICD-10-CM | POA: Diagnosis not present

## 2020-06-29 DIAGNOSIS — E230 Hypopituitarism: Secondary | ICD-10-CM | POA: Diagnosis not present

## 2020-06-29 LAB — TESTOSTERONE: Testosterone: 163.11 ng/dL — ABNORMAL LOW (ref 300.00–890.00)

## 2020-06-29 LAB — HEMOGLOBIN A1C: Hgb A1c MFr Bld: 6 % (ref 4.6–6.5)

## 2020-06-29 LAB — GLUCOSE, RANDOM: Glucose, Bld: 149 mg/dL — ABNORMAL HIGH (ref 70–99)

## 2020-07-03 ENCOUNTER — Encounter: Payer: Self-pay | Admitting: Endocrinology

## 2020-07-03 ENCOUNTER — Other Ambulatory Visit: Payer: Self-pay

## 2020-07-03 ENCOUNTER — Ambulatory Visit: Payer: No Typology Code available for payment source | Admitting: Endocrinology

## 2020-07-03 VITALS — BP 140/80 | HR 92 | Ht 75.0 in | Wt 272.8 lb

## 2020-07-03 DIAGNOSIS — I10 Essential (primary) hypertension: Secondary | ICD-10-CM

## 2020-07-03 DIAGNOSIS — Z6834 Body mass index (BMI) 34.0-34.9, adult: Secondary | ICD-10-CM

## 2020-07-03 DIAGNOSIS — R7301 Impaired fasting glucose: Secondary | ICD-10-CM | POA: Diagnosis not present

## 2020-07-03 DIAGNOSIS — E230 Hypopituitarism: Secondary | ICD-10-CM

## 2020-07-03 DIAGNOSIS — R7303 Prediabetes: Secondary | ICD-10-CM

## 2020-07-03 NOTE — Progress Notes (Signed)
Patient ID: Dustin Mckee, male   DOB: June 18, 1956, 64 y.o.   MRN: [id number]            Referring physician: Karyl Kinnier   Chief complaint: Endocrinology follow-up  History of Present Illness  Hypogonadism: Diagnosed in 2012 by his previous primary care physician when he was having complaints of fatigue and lethargy He does not know the details about his evaluation and no records are available However he was treated with a testosterone gel for several months with improvement in his symptoms He then stopped treatment on his own  He had had complaints offatigue, some lethargy, decreased motivation, decreased libido for a few months prior to his initial consultation He had testosterone levels levels that were significantly low  With his evaluation 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, prolactin upper normal He had been on CLOMIPHENE half tablet 3 times a week since early April 2017  RECENT history:  In 9/18 with taking clomiphene 3 times a week follow-up level was up to 326 Because of his complaints of  having some decreased energy level and libido, lower motivation level he was switched from clomiphene to testosterone gel 2 pumps daily With this change he started feeling much better with his energy level and libido  Testosterone level has gone down to as low as 127 in the past without treatment Has been prescribed AndroGel 1.62% 3 pumps daily  Again has a low testosterone level related to being out of his AndroGel for at least 3 weeks Previously his level had been normal with the same dose and he had been subjectively feeling good On his last visit the dose was not changed  He has not been able to use his testosterone for at least 3 weeks because of busy work schedule and not having time in the morning to do it He says he is feeling a little more tired than usual but not significantly  Testosterone level now is back  below 200   His labs showtestosterone levels as below:  Lab Results  Component Value Date   TESTOSTERONE 163.11 (L) 06/29/2020   TESTOSTERONE 302.16 12/27/2019   TESTOSTERONE 127.53 (L) 08/30/2019   TESTOSTERONE 196.14 (L) 04/28/2019    Baseline free testosterone level was decreased at 19, normal  >35.8  Prolactin level: 15.4  Lab Results  Component Value Date   LH 6.04 03/20/2018      Diabetes screening: See review of systems     Allergies as of 07/03/2020      Reactions   Iodine Hives      Medication List       Accurate as of July 03, 2020  8:47 AM. If you have any questions, ask your nurse or doctor.        amLODipine 2.5 MG tablet Commonly known as: NORVASC Take 1 tablet (2.5 mg total) by mouth daily.   hydrochlorothiazide 25 MG tablet Commonly known as: HYDRODIURIL Take 1 tablet (25 mg total) by mouth daily.   neomycin-polymyxin-hydrocortisone OTIC solution Commonly known as: CORTISPORIN Apply 1 to 2 drops to toe BID after soaking   rosuvastatin 20 MG tablet Commonly known as: CRESTOR Take 1 tablet (20 mg total) by mouth daily.   tamsulosin 0.4 MG Caps capsule Commonly known as: FLOMAX Take 1 capsule (0.4 mg total) by mouth daily.   Testosterone 20.25 MG/ACT (1.62%) Gel Apply 1 pump on one shoulder and 2 on the other once daily and allow to  dry       Allergies:  Allergies  Allergen Reactions  . Iodine Hives    Past Medical History:  Diagnosis Date  . Allergy   . Hyperlipidemia   . Hypertension   . Sleep apnea    CPAP    Past Surgical History:  Procedure Laterality Date  . APPENDECTOMY    . TOOTH EXTRACTION    . VASECTOMY      Family History  Problem Relation Age of Onset  . Heart disease Mother   . Kidney disease Mother   . Heart disease Father        defibrillator/CHF.  . Diabetes Father   . Heart disease Sister        CHF  . Diabetes Sister   . Heart disease Sister        CHF    Social History:  reports  that he has never smoked. He has never used smokeless tobacco. He reports that he does not drink alcohol and does not use drugs.  Review of Systems  He has had prediabetes with persistently high fasting glucose, last 108 However lab on this visit was after breakfast and glucose was 149 A1c was higher in 2020 at 6.3 and now is down to 6.0  Has not had any nutritional counseling Not exercising regularly because of her busy work schedule  His maximum weight has been about 290   Family history of diabetes   Wt Readings from Last 3 Encounters:  07/03/20 272 lb 12.8 oz (123.7 kg)  01/17/20 270 lb (122.5 kg)  01/03/20 271 lb 3.2 oz (123 kg)   Lab Results  Component Value Date   HGBA1C 6.0 06/29/2020   HGBA1C 5.7 (H) 01/17/2020   HGBA1C 6.3 08/30/2019   Lab Results  Component Value Date   LDLCALC 79 01/17/2020   CREATININE 1.20 01/17/2020     General Examination:   BP 140/80 (BP Location: Left Arm, Patient Position: Sitting, Cuff Size: Large)   Pulse 92   Ht 6\' 3"  (1.905 m)   Wt 272 lb 12.8 oz (123.7 kg)   SpO2 93%   BMI 34.10 kg/m    Assessment/ Plan:  Hypogonadotropic Hypogonadism, 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 about 2012  He previously has had good control of his symptoms with testosterone supplementation Currently has been prescribed AndroGel but recently not taking this because of not having time to operate on in the mornings He does have a little more fatigue but not significant as expected from his testosterone level of 167  Discussed the various options that he can use instead of AndroGel such as applying Axiron in the armpit area in the evenings; alternatively can use either weekly injections or Androderm patches With Androderm most people have significant skin reactions He feels that he wants to try the AndroGel again for now  He will restart the same regimen of using AndroGel 3 pumps total daily but try to use it in the  morning daily  Impaired fasting glucose: This is likely somewhat worse from inconsistent diet and exercise  However A1c is still only 6% and he is not a candidate for Metformin at this time  Hypertension: Currently managed with HCTZ alone and because of his prediabetes and low normal potassium this can be reduced to 12.5 mg while adjusting the other medications, he will discuss this patient's PCP    2013 07/03/2020, 8:47 AM

## 2020-07-06 ENCOUNTER — Other Ambulatory Visit: Payer: Self-pay | Admitting: Endocrinology

## 2020-07-11 ENCOUNTER — Other Ambulatory Visit: Payer: Self-pay | Admitting: Family Medicine

## 2020-07-11 DIAGNOSIS — I10 Essential (primary) hypertension: Secondary | ICD-10-CM

## 2020-07-11 NOTE — Telephone Encounter (Signed)
Requested Prescriptions  Pending Prescriptions Disp Refills  . hydrochlorothiazide (HYDRODIURIL) 25 MG tablet [Pharmacy Med Name: HYDROCHLOROTHIAZIDE 25 MG TAB] 90 tablet 1    Sig: TAKE 1 TABLET BY MOUTH EVERY DAY     Cardiovascular: Diuretics - Thiazide Failed - 07/11/2020  1:38 AM      Failed - Last BP in normal range    BP Readings from Last 1 Encounters:  07/03/20 140/80         Passed - Ca in normal range and within 360 days    Calcium  Date Value Ref Range Status  01/17/2020 9.4 8.6 - 10.2 mg/dL Final         Passed - Cr in normal range and within 360 days    Creat  Date Value Ref Range Status  04/17/2016 1.14 0.70 - 1.33 mg/dL Final   Creatinine, Ser  Date Value Ref Range Status  01/17/2020 1.20 0.76 - 1.27 mg/dL Final         Passed - K in normal range and within 360 days    Potassium  Date Value Ref Range Status  01/17/2020 3.9 3.5 - 5.2 mmol/L Final         Passed - Na in normal range and within 360 days    Sodium  Date Value Ref Range Status  01/17/2020 140 134 - 144 mmol/L Final         Passed - Valid encounter within last 6 months    Recent Outpatient Visits          5 months ago Annual physical exam   [facility], Asencion Partridge, MD   1 year ago Hyperlipidemia, unspecified hyperlipidemia type   [facility], Asencion Partridge, MD   1 year ago Annual physical exam   [facility] at [facility], Asencion Partridge, MD   2 years ago Hematuria, unspecified type   Primary Care at [facility], Eilleen Kempf, MD   2 years ago Urinary frequency   Primary Care at [place], [place], MD      Future Appointments            In 1 week Shade Flood, MD Primary Care at [place], [facility]           . amLODipine (NORVASC) 2.5 MG tablet [Pharmacy Med Name: AMLODIPINE BESYLATE 2.5 MG TAB] 90 tablet 1    Sig: TAKE 1 TABLET BY MOUTH EVERY DAY     Cardiovascular:  Calcium Channel Blockers Failed - 07/11/2020  1:38 AM      Failed -  Last BP in normal range    BP Readings from Last 1 Encounters:  07/03/20 140/80         Passed - Valid encounter within last 6 months    Recent Outpatient Visits          5 months ago Annual physical exam   [facility] at [facility], Asencion Partridge, MD   1 year ago Hyperlipidemia, unspecified hyperlipidemia type   [facility] at [facility], Asencion Partridge, MD   1 year ago Annual physical exam   [facility] at [facility], Asencion Partridge, MD   2 years ago Hematuria, unspecified type   Primary Care at [facility], Eilleen Kempf, MD   2 years ago Urinary frequency   Primary Care at [facility], Eilleen Kempf, MD      Future Appointments            In  1 week Shade Flood, MD Primary Care at [place], [facility]           Patient has an appt in one week.

## 2020-07-19 ENCOUNTER — Ambulatory Visit: Payer: 59 | Admitting: Family Medicine

## 2020-07-20 ENCOUNTER — Encounter: Payer: Self-pay | Admitting: Family Medicine

## 2020-08-03 ENCOUNTER — Telehealth: Payer: Self-pay

## 2020-08-03 NOTE — Telephone Encounter (Signed)
Patient return call - provided information from message below and he acknowledged understanding of message

## 2020-08-03 NOTE — Telephone Encounter (Signed)
Testosterone Gel has been approved through cover my meds at 14:32pm. Please advise patient to contact pharmacy when he calls back

## 2020-08-14 ENCOUNTER — Other Ambulatory Visit (INDEPENDENT_AMBULATORY_CARE_PROVIDER_SITE_OTHER): Payer: No Typology Code available for payment source

## 2020-08-14 ENCOUNTER — Other Ambulatory Visit: Payer: Self-pay

## 2020-08-14 DIAGNOSIS — I10 Essential (primary) hypertension: Secondary | ICD-10-CM

## 2020-08-14 DIAGNOSIS — R7303 Prediabetes: Secondary | ICD-10-CM

## 2020-08-14 LAB — TESTOSTERONE: Testosterone: 301.64 ng/dL (ref 300.00–890.00)

## 2020-08-14 NOTE — Progress Notes (Signed)
Please call to let patient know that the lab results are normal and no further action needed

## 2020-08-16 ENCOUNTER — Other Ambulatory Visit: Payer: No Typology Code available for payment source

## 2020-08-21 ENCOUNTER — Other Ambulatory Visit: Payer: Self-pay

## 2020-08-21 ENCOUNTER — Encounter: Payer: Self-pay | Admitting: Dietician

## 2020-08-21 ENCOUNTER — Encounter: Payer: No Typology Code available for payment source | Attending: Endocrinology | Admitting: Dietician

## 2020-08-21 DIAGNOSIS — R7303 Prediabetes: Secondary | ICD-10-CM | POA: Diagnosis present

## 2020-08-21 DIAGNOSIS — I10 Essential (primary) hypertension: Secondary | ICD-10-CM

## 2020-08-21 DIAGNOSIS — E669 Obesity, unspecified: Secondary | ICD-10-CM | POA: Diagnosis present

## 2020-08-21 DIAGNOSIS — E785 Hyperlipidemia, unspecified: Secondary | ICD-10-CM | POA: Diagnosis not present

## 2020-08-21 DIAGNOSIS — E66811 Obesity, class 1: Secondary | ICD-10-CM

## 2020-08-21 DIAGNOSIS — Z6833 Body mass index (BMI) 33.0-33.9, adult: Secondary | ICD-10-CM | POA: Insufficient documentation

## 2020-08-21 NOTE — Patient Instructions (Addendum)
Plan: Consider getting your vitamin D rechecked.  Avoid stress eating. Getting back to the gym would be a great idea! Aim for at least 30 minutes of exercise most days.  Low fat and particularly a low saturated fat diet. More plants Whole grains Choose legumes (beans, peas, lentils) rather than meat more often.  Resources for plant based: pcrm article provided [employer] (particularly interview with Jacqlyn Krauss) Engine 2 diet

## 2020-08-21 NOTE — Progress Notes (Signed)
  Medical Nutrition Therapy:  Appt start time: 1545 end time:  1630.   Assessment:  Primary concerns today:  Patient is here today alone.  He was referred by Dr. Lucianne Muss with impaired fasting glucose, prediabetes, HTN and elevated BMI.  He also has a history of vitamin D deficiency. Patient states that his father had diabetes and required insulin and he would like to avoid diabetes and it's complications. He asked about a vegan diet and states that he has interest in this. A1C 6% 06/30/19 increased from 5.7%, Glucose 149 (not fasting)  Weight hx: 265 lbs current weight 175-180 lbs lowest adult weight in college 290 lbs highest adult weight in 2019. He feels that he is continuing to lose.  Patient lives with his wife and daughter who is in college.  They share shopping and cooking.  They eat out infrequently. He works as a Insurance account manager (desk work). He used to have to commute to [place] and this stopped in 2018.  Increased stress and eating out at that time.  He lost from 290 lbs to 265 lbs since changing his work.   Preferred Learning Style:   No preference indicated   Learning Readiness:   Ready  Change in progress   DIETARY INTAKE: Little red meat  24-hr recall:  B ( AM): steel cut oats, raisins OR egg white omelet with avocado or fruit  Snk ( AM): none  L ( PM): skips OR sub  Snk ( PM): none D ( PM): fish or chicken or meat, zucchini or broccoli, brown rice Snk ( PM): yogurt, nuts (unsalted), popcorn (regular)  "BORED" Beverages: water, sparkling water, black coffee  Usual physical activity: walks with his job and walks several blocks down town.  Stopped going to [employer] when covid hit. He is considering going back to the gym as he always like working out at night ([employer], treadmill, bike, basketball court "favorite")  Progress Towards Goal(s):  In progress.   Nutritional Diagnosis:  NB-1.1 Food and nutrition-related knowledge deficit As related  to nutrition and exercise for prevention of diabetes.  As evidenced by diet hx and patient repeat.    Intervention:  Nutrition education related to prevention of diabetes.  Discussed importance of fiber, low fat/low saturated fat diet, weight loss, and benefits of more plants in general as well as benefits of exercise.  Discussed avoidance of boredom eating and options he enjoys to help with boredom.  Discussed plant based eating, simple meal ideas, and resources.  Plan: Consider getting your vitamin D rechecked.  Avoid stress eating. Getting back to the gym would be a great idea! Aim for at least 30 minutes of exercise most days.  Low fat and particularly a low saturated fat diet. More plants Whole grains Choose legumes (beans, peas, lentils) rather than meat more often.  Resources for plant based: pcrm article provided [employer] (particularly interview with Jacqlyn Krauss) Engine 2 diet   Teaching Method Utilized:  Visual Auditory  Handouts given during visit include:  Diabetes resource page  Diet and Diabetes, Recipes for Success from pcrm.org   Barriers to learning/adherence to lifestyle change: none  Demonstrated degree of understanding via:  Teach Back   Monitoring/Evaluation:  Dietary intake, exercise, and body weight prn.

## 2020-10-01 ENCOUNTER — Other Ambulatory Visit: Payer: Self-pay | Admitting: Endocrinology

## 2020-10-10 ENCOUNTER — Encounter: Payer: Self-pay | Admitting: Family Medicine

## 2020-10-11 ENCOUNTER — Ambulatory Visit: Payer: Self-pay | Admitting: *Deleted

## 2020-10-11 ENCOUNTER — Other Ambulatory Visit: Payer: Self-pay | Admitting: Family Medicine

## 2020-10-11 DIAGNOSIS — E785 Hyperlipidemia, unspecified: Secondary | ICD-10-CM

## 2020-10-11 DIAGNOSIS — I10 Essential (primary) hypertension: Secondary | ICD-10-CM

## 2020-10-11 NOTE — Telephone Encounter (Signed)
Lately for 2 wks I'm having discomfort in the mornings.   I had appt yesterday with another dr. And my BP was elevated.  I also had a headache yesterday that's unusual for me.  I have reflux.   When I burp it helps my chest feel better.   I was taking OTC Prevacid and it helped the chest discomfort.   I mostly have this chest discomfort "like an air bubble in my chest" in the mornings.   After I'm up for a while it goes away.   Sometimes it occurs on and off during the day which burping helps.  No longer on the Prevacid because the package said to take it for 14 days only so I didn't get any more.  I went over the care advice with him.  His BP has been elevated when he has been at other dr . Dustin Mckee lately.   Still taking his BP medication.   "Dr. Neva Seat changed my BP medication earlier this year".    Protocol is to be seen within 24 hours.  [facility] does not schedule for this practice so I'm transferring his call into Primary Care at [facility] to be scheduled.  Spoke with Corrie Dandy at [facility] who is going to schedule him.    Reason for Disposition . [1] Patient says chest pain feels exactly the same as previously diagnosed "heartburn" AND [2] describes burning in chest AND [3] accompanying sour taste in mouth  Answer Assessment - Initial Assessment Questions 1. LOCATION: "Where does it hurt?"       Right side of chest.   Tightness.   I can breath. 2. RADIATION: "Does the pain go anywhere else?" (e.g., into neck, jaw, arms, back)     No 3. ONSET: "When did the chest pain begin?" (Minutes, hours or days)      2 wks ago.   It's a discomfort not a pain a tightness.  It comes and goes 4. PATTERN "Does the pain come and go, or has it been constant since it started?"  "Does it get worse with exertion?"      Mostly in mornings but also during the day. I started Prevacid because I have reflux. 180/something then 144/100 at the dr's office yesterday.   I had a headache yesterday which I don't normally have.     I'm on BP medication.   Dr. Neva Seat changed it earlier this year and my BP has been slightly elevated at other dr visits. 5. DURATION: "How long does it last" (e.g., seconds, minutes, hours)     The tightness in my chest varies.    I think it's indigestion. 6. SEVERITY: "How bad is the pain?"  (e.g., Scale 1-10; mild, moderate, or severe)    - MILD (1-3): doesn't interfere with normal activities     - MODERATE (4-7): interferes with normal activities or awakens from sleep    - SEVERE (8-10): excruciating pain, unable to do any normal activities       1-2 on scale.  It's not sharp just a discomfort.  When I take a deep breath I can feet it.   No dizziness. 7. CARDIAC RISK FACTORS: "Do you have any history of heart problems or risk factors for heart disease?" (e.g., angina, prior heart attack; diabetes, high blood pressure, high cholesterol, smoker, or strong family history of heart disease)     Hypertension,  I had a stress test 4-5 yrs ago and it was fine. 8. PULMONARY RISK FACTORS: "Do  you have any history of lung disease?"  (e.g., blood clots in lung, asthma, emphysema, birth control pills)     No clots or illnesses to lungs. 9. CAUSE: "What do you think is causing the chest pain?"     I think it's gas.   When I burp it gets better.  I took the Prevacid for 14 days and it helped. 10. OTHER SYMPTOMS: "Do you have any other symptoms?" (e.g., dizziness, nausea, vomiting, sweating, fever, difficulty breathing, cough)       No dizziness, no shortness of breath or nausea. 11. PREGNANCY: "Is there any chance you are pregnant?" "When was your last menstrual period?"       NA  Protocols used: CHEST PAIN-A-AH

## 2020-10-11 NOTE — Telephone Encounter (Signed)
Requested medication (s) are due for refill today: yes  Requested medication (s) are on the active medication list: yes   Last refill: 07/12/2020  Future visit scheduled: no  Notes to clinic: overdue for follow up Looks like patient will be calling into schedule appointment    Requested Prescriptions  Pending Prescriptions Disp Refills   hydrochlorothiazide (HYDRODIURIL) 25 MG tablet [Pharmacy Med Name: HYDROCHLOROTHIAZIDE 25 MG TAB] 90 tablet 0    Sig: TAKE 1 TABLET BY MOUTH EVERY DAY      Cardiovascular: Diuretics - Thiazide Failed - 10/11/2020  2:27 AM      Failed - Last BP in normal range    BP Readings from Last 1 Encounters:  07/03/20 140/80          Failed - Valid encounter within last 6 months    Recent Outpatient Visits           8 months ago Annual physical exam   [facility], Asenc[place] Partridge, MD   1 year ago Hyperlipidemia, unspecified hyperlipidemia type   [facility] at [facility], Asencion Partridge, MD   1 year ago Annual physical exam   [facility] at [facility], Asencion Partridge, MD   2 years ago Hematuria, unspecified type   Primary Care at [facility], Eilleen Kempf, MD   2 years ago Urinary frequency   Primary Care at [place], [place], MD              Passed - Ca in normal range and within 360 days    Calcium  Date Value Ref Range Status  01/17/2020 9.4 8.6 - 10.2 mg/dL Final          Passed - Cr in normal range and within 360 days    Creat  Date Value Ref Range Status  04/17/2016 1.14 0.70 - 1.33 mg/dL Final   Creatinine, Ser  Date Value Ref Range Status  01/17/2020 1.20 0.76 - 1.27 mg/dL Final          Passed - K in normal range and within 360 days    Potassium  Date Value Ref Range Status  01/17/2020 3.9 3.5 - 5.2 mmol/L Final          Passed - Na in normal range and within 360 days    Sodium  Date Value Ref Range Status  01/17/2020 140 134 - 144 mmol/L Final            amLODipine (NORVASC) 2.5 MG  tablet [Pharmacy Med Name: AMLODIPINE BESYLATE 2.5 MG TAB] 90 tablet 0    Sig: TAKE 1 TABLET BY MOUTH EVERY DAY      Cardiovascular:  Calcium Channel Blockers Failed - 10/11/2020  2:27 AM      Failed - Last BP in normal range    BP Readings from Last 1 Encounters:  07/03/20 140/80          Failed - Valid encounter within last 6 months    Recent Outpatient Visits           8 months ago Annual physical exam   [facility] at [facility], Asencion Partridge, MD   1 year ago Hyperlipidemia, unspecified hyperlipidemia type   [facility] at [facility], Asencion Partridge, MD   1 year ago Annual physical exam   [facility] at [facility], Asencion Partridge, MD   2 years ago Hematuria, unspecified type   Primary Care at [facility], Eilleen Kempf, MD  2 years ago Urinary frequency   Primary Care at [facility], [place], MD               Signed Prescriptions Disp Refills   rosuvastatin (CRESTOR) 20 MG tablet 90 tablet 0    Sig: TAKE 1 TABLET BY MOUTH EVERY DAY      Cardiovascular:  Antilipid - Statins Failed - 10/11/2020  2:27 AM      Failed - LDL in normal range and within 360 days    LDL Chol Calc (NIH)  Date Value Ref Range Status  01/17/2020 79 0 - 99 mg/dL Final          Failed - Triglycerides in normal range and within 360 days    Triglycerides  Date Value Ref Range Status  01/17/2020 213 (H) 0 - 149 mg/dL Final          Passed - Total Cholesterol in normal range and within 360 days    Cholesterol, Total  Date Value Ref Range Status  01/17/2020 159 100 - 199 mg/dL Final          Passed - HDL in normal range and within 360 days    HDL  Date Value Ref Range Status  01/17/2020 44 >39 mg/dL Final          Passed - Patient is not pregnant      Passed - Valid encounter within last 12 months    Recent Outpatient Visits           8 months ago Annual physical exam   [facility] at [facility], Asencion Partridge, MD   1 year ago Hyperlipidemia, unspecified  hyperlipidemia type   [facility] at [facility], Asencion Partridge, MD   1 year ago Annual physical exam   [facility] at [facility], Asencion Partridge, MD   2 years ago Hematuria, unspecified type   Primary Care at [facility], Eilleen Kempf, MD   2 years ago Urinary frequency   Primary Care at [facility], [place], MD

## 2020-10-12 ENCOUNTER — Ambulatory Visit: Payer: No Typology Code available for payment source | Admitting: Emergency Medicine

## 2020-10-12 ENCOUNTER — Other Ambulatory Visit: Payer: Self-pay

## 2020-10-12 ENCOUNTER — Encounter: Payer: Self-pay | Admitting: Emergency Medicine

## 2020-10-12 VITALS — BP 138/85 | HR 89 | Temp 97.2°F | Resp 16 | Ht 75.0 in | Wt 276.0 lb

## 2020-10-12 DIAGNOSIS — E785 Hyperlipidemia, unspecified: Secondary | ICD-10-CM | POA: Diagnosis not present

## 2020-10-12 DIAGNOSIS — R0789 Other chest pain: Secondary | ICD-10-CM | POA: Diagnosis not present

## 2020-10-12 DIAGNOSIS — Z8249 Family history of ischemic heart disease and other diseases of the circulatory system: Secondary | ICD-10-CM | POA: Diagnosis not present

## 2020-10-12 DIAGNOSIS — I10 Essential (primary) hypertension: Secondary | ICD-10-CM

## 2020-10-12 NOTE — Progress Notes (Signed)
Dustin Mckee 64 y.o.   Chief Complaint  Patient presents with  . Chest Pain    x 2 weeks per patient discomfort    HISTORY OF PRESENT ILLNESS: This is a 64 y.o. male complaining of right-sided chest "discomfort" for the past 2 weeks, mostly in the morning, lasting 2 to 3 hours at times with some mild dyspnea.  No other associated symptoms. History of hypertension and dyslipidemia.  Strong family history of heart disease, coronary artery disease. No other complaints or medical concerns today 2017 echocardiogram and nuclear stress test results reviewed. Has history of GERD on Prevacid but this feels different.  HPI   Prior to Admission medications   Medication Sig Start Date End Date Taking? Authorizing Provider  amLODipine (NORVASC) 2.5 MG tablet TAKE 1 TABLET BY MOUTH EVERY DAY 10/11/20   Shade Flood, MD  cholecalciferol (VITAMIN D3) 25 MCG (1000 UNIT) tablet Take 10,000 Units by mouth daily.    [provider]  hydrochlorothiazide (HYDRODIURIL) 25 MG tablet TAKE 1 TABLET BY MOUTH EVERY DAY 10/11/20   Shade Flood, MD  Multiple Vitamins-Minerals (MENS 50+ ADVANCED PO) Take by mouth.    [provider]  neomycin-polymyxin-hydrocortisone (CORTISPORIN) OTIC solution Apply 1 to 2 drops to toe BID after soaking Patient not taking: Reported on 08/21/2020 05/26/19   Lenn Sink, DPM  rosuvastatin (CRESTOR) 20 MG tablet TAKE 1 TABLET BY MOUTH EVERY DAY 10/11/20   Shade Flood, MD  tamsulosin (FLOMAX) 0.4 MG CAPS capsule Take 1 capsule (0.4 mg total) by mouth daily. Patient not taking: Reported on 08/21/2020 01/17/20   Shade Flood, MD  Testosterone 20.25 MG/ACT (1.62%) GEL APPLY 1 PUMP ON ONE SHOULDER AND 2 ON THE OTHER ONCE DAILY AND ALLOW TO DRY 10/01/20   Reather Littler, MD    Allergies  Allergen Reactions  . Iodine Hives    Patient Active Problem List   Diagnosis Date Noted  . Dysuria 05/20/2018  . Acute UTI 05/20/2018  . Urinary frequency  05/20/2018  . Obstructive sleep apnea treated with continuous positive airway pressure (CPAP) 01/13/2018  . Sinusitis chronic, frontal 10/15/2017  . Excessive daytime sleepiness 10/15/2017  . Seasonal allergic rhinitis due to pollen 10/15/2017  . Hyperlipidemia 09/05/2014  . Vitamin D deficiency 06/14/2014  . Benign prostatic hyperplasia with urinary frequency 06/14/2014  . GERD 01/23/2009  . HYPERLIPIDEMIA 12/14/2008  . SLEEP APNEA 11/29/2008    Past Medical History:  Diagnosis Date  . Allergy   . Hyperlipidemia   . Hypertension   . Prediabetes   . Sleep apnea    CPAP    Past Surgical History:  Procedure Laterality Date  . APPENDECTOMY    . TOOTH EXTRACTION    . VASECTOMY      Social History   Socioeconomic History  . Marital status: Married    Spouse name: [name]  . Number of children: Not on file  . Years of education: Not on file  . Highest education level: Not on file  Occupational History  . Not on file  Tobacco Use  . Smoking status: Never Smoker  . Smokeless tobacco: Never Used  Substance and Sexual Activity  . Alcohol use: No  . Drug use: No  . Sexual activity: Yes    Birth control/protection: None  Other Topics Concern  . Not on file  Social History Narrative   Drinks 1-2 cups caffeine daily.   Social Determinants of Health   Financial Resource Strain:   .  Difficulty of Paying Living Expenses: Not on file  Food Insecurity:   . Worried About Programme researcher, broadcasting/film/video in the Last Year: Not on file  . Ran Out of Food in the Last Year: Not on file  Transportation Needs:   . Lack of Transportation (Medical): Not on file  . Lack of Transportation (Non-Medical): Not on file  Physical Activity:   . Days of Exercise per Week: Not on file  . Minutes of Exercise per Session: Not on file  Stress:   . Feeling of Stress : Not on file  Social Connections:   . Frequency of Communication with Friends and Family: Not on file  . Frequency of Social Gatherings  with Friends and Family: Not on file  . Attends Religious Services: Not on file  . Active Member of Clubs or Organizations: Not on file  . Attends Banker Meetings: Not on file  . Marital Status: Not on file  Intimate Partner Violence:   . Fear of Current or Ex-Partner: Not on file  . Emotionally Abused: Not on file  . Physically Abused: Not on file  . Sexually Abused: Not on file    Family History  Problem Relation Age of Onset  . Heart disease Mother   . Kidney disease Mother   . Heart disease Father        defibrillator/CHF.  . Diabetes Father   . Heart disease Sister        CHF  . Diabetes Sister   . Heart disease Sister        CHF     Review of Systems  Constitutional: Negative.  Negative for chills and fever.  HENT: Negative.  Negative for congestion and sore throat.   Respiratory: Negative.  Negative for cough and shortness of breath.   Cardiovascular: Positive for chest pain. Negative for palpitations, orthopnea, claudication, leg swelling and PND.  Gastrointestinal: Positive for heartburn. Negative for abdominal pain, diarrhea, nausea and vomiting.  Genitourinary: Negative.  Negative for dysuria.  Skin: Negative.  Negative for rash.  Neurological: Negative.  Negative for dizziness, focal weakness, loss of consciousness, weakness and headaches.  All other systems reviewed and are negative.  Today's Vitals   10/12/20 1518  BP: 138/85  Pulse: 89  Resp: 16  Temp: (!) 97.2 F (36.2 C)  TempSrc: Temporal  SpO2: 95%  Weight: 276 lb (125.2 kg)  Height: 6\' 3"  (1.905 m)   Body mass index is 34.5 kg/m.   Physical Exam Vitals reviewed.  Constitutional:      Appearance: He is well-developed.  HENT:     Head: Normocephalic.  Eyes:     Extraocular Movements: Extraocular movements intact.     Conjunctiva/sclera: Conjunctivae normal.     Pupils: Pupils are equal, round, and reactive to light.  Cardiovascular:     Rate and Rhythm: Normal rate and  regular rhythm.     Pulses: Normal pulses.     Heart sounds: Normal heart sounds.  Pulmonary:     Effort: Pulmonary effort is normal.     Breath sounds: Normal breath sounds.  Musculoskeletal:        General: Normal range of motion.     Cervical back: Normal range of motion and neck supple.  Skin:    General: Skin is warm and dry.     Capillary Refill: Capillary refill takes less than 2 seconds.  Neurological:     General: No focal deficit present.     Mental Status:  He is alert and oriented to person, place, and time.  Psychiatric:        Mood and Affect: Mood normal.        Behavior: Behavior normal.   EKG: Normal sinus rhythm with normal ventricular rate.  No acute ischemic changes.  Compared with EKG done June 2017, no significant changes.  A total of 45 minutes was spent with the patient, greater than 50% of which was in counseling/coordination of care regarding differential diagnosis of chest pain and need for cardiology evaluation and diagnostic work-up to rule out significant coronary artery disease, review of previous and most recent EKG, review of 2017 echocardiogram and nuclear stress test, review of most recent office visit notes, review of cardiac risk factors, ED precautions, prognosis, documentation and need for follow-up.  ASSESSMENT & PLAN: Dustin Mckee was seen today for chest pain.  Diagnoses and all orders for this visit:  Chest tightness -     EKG 12-Lead -     Ambulatory referral to Cardiology  Essential hypertension  Hyperlipidemia, unspecified hyperlipidemia type  Family history of coronary artery disease    Patient Instructions       If you have lab work done today you will be contacted with your lab results within the next 2 weeks.  If you have not heard from us then please contact us. The fastest way to get your results is to register for My Chart.   IF you received an x-ray today, you will receive an invoice from [facility]. Please  contact [facility] at [phone] with questions or concerns regarding your invoice.   IF you received labwork today, you will receive an invoice from [facility]. Please contact [facility] at [phone] with questions or concerns regarding your invoice.   Our billing staff will not be able to assist you with questions regarding bills from these companies.  You will be contacted with the lab results as soon as they are available. The fastest way to get your results is to activate your My Chart account. Instructions are located on the last page of this paperwork. If you have not heard from us regarding the results in 2 weeks, please contact this office.     Nonspecific Chest Pain Chest pain can be caused by many different conditions. Some causes of chest pain can be life-threatening. These will require treatment right away. Serious causes of chest pain include:  Heart attack.  A tear in the body's main blood vessel.  Redness and swelling (inflammation) around your heart.  Blood clot in your lungs. Other causes of chest pain may not be so serious. These include:  Heartburn.  Anxiety or stress.  Damage to bones or muscles in your chest.  Lung infections. Chest pain can feel like:  Pain or discomfort in your chest.  Crushing, pressure, aching, or squeezing pain.  Burning or tingling.  Dull or sharp pain that is worse when you move, cough, or take a deep breath.  Pain or discomfort that is also felt in your back, neck, jaw, shoulder, or arm, or pain that spreads to any of these areas. It is hard to know whether your pain is caused by something that is serious or something that is not so serious. So it is important to see your doctor right away if you have chest pain. Follow these instructions at home: Medicines  Take over-the-counter and prescription medicines only as told by your doctor.  If you were prescribed an antibiotic medicine, take it as told  by your doctor.  Do not stop taking the antibiotic even if you start to feel better. Lifestyle   Rest as told by your doctor.  Do not use any products that contain nicotine or tobacco, such as cigarettes, e-cigarettes, and chewing tobacco. If you need help quitting, ask your doctor.  Do not drink alcohol.  Make lifestyle changes as told by your doctor. These may include: ? Getting regular exercise. Ask your doctor what activities are safe for you. ? Eating a heart-healthy diet. A diet and nutrition specialist (dietitian) can help you to learn healthy eating options. ? Staying at a healthy weight. ? Treating diabetes or high blood pressure, if needed. ? Lowering your stress. Activities such as yoga and relaxation techniques can help. General instructions  Pay attention to any changes in your symptoms. Tell your doctor about them or any new symptoms.  Avoid any activities that cause chest pain.  Keep all follow-up visits as told by your doctor. This is important. You may need more testing if your chest pain does not go away. Contact a doctor if:  Your chest pain does not go away.  You feel depressed.  You have a fever. Get help right away if:  Your chest pain is worse.  You have a cough that gets worse, or you cough up blood.  You have very bad (severe) pain in your belly (abdomen).  You pass out (faint).  You have either of these for no clear reason: ? Sudden chest discomfort. ? Sudden discomfort in your arms, back, neck, or jaw.  You have shortness of breath at any time.  You suddenly start to sweat, or your skin gets clammy.  You feel sick to your stomach (nauseous).  You throw up (vomit).  You suddenly feel lightheaded or dizzy.  You feel very weak or tired.  Your heart starts to beat fast, or it feels like it is skipping beats. These symptoms may be an emergency. Do not wait to see if the symptoms will go away. Get medical help right away. Call your local emergency services  (911 in the [place] Do not drive yourself to the hospital. Summary  Chest pain can be caused by many different conditions. The cause may be serious and need treatment right away. If you have chest pain, see your doctor right away.  Follow your doctor's instructions for taking medicines and making lifestyle changes.  Keep all follow-up visits as told by your doctor. This includes visits for any further testing if your chest pain does not go away.  Be sure to know the signs that show that your condition has become worse. Get help right away if you have these symptoms. This information is not intended to replace advice given to you by your health care provider. Make sure you discuss any questions you have with your health care provider. Document Revised: 04/30/2018 Document Reviewed: 04/30/2018 Elsevier Patient Education  2020 Elsevier Inc.      Edwina Barth, MD [facility]

## 2020-10-12 NOTE — Patient Instructions (Addendum)
   If you have lab work done today you will be contacted with your lab results within the next 2 weeks.  If you have not heard from us then please contact us. The fastest way to get your results is to register for My Chart.   IF you received an x-ray today, you will receive an invoice from [facility]. Please contact [facility] at [phone] with questions or concerns regarding your invoice.   IF you received labwork today, you will receive an invoice from [facility]. Please contact [facility] at [phone] with questions or concerns regarding your invoice.   Our billing staff will not be able to assist you with questions regarding bills from these companies.  You will be contacted with the lab results as soon as they are available. The fastest way to get your results is to activate your My Chart account. Instructions are located on the last page of this paperwork. If you have not heard from us regarding the results in 2 weeks, please contact this office.     Nonspecific Chest Pain Chest pain can be caused by many different conditions. Some causes of chest pain can be life-threatening. These will require treatment right away. Serious causes of chest pain include:  Heart attack.  A tear in the body's main blood vessel.  Redness and swelling (inflammation) around your heart.  Blood clot in your lungs. Other causes of chest pain may not be so serious. These include:  Heartburn.  Anxiety or stress.  Damage to bones or muscles in your chest.  Lung infections. Chest pain can feel like:  Pain or discomfort in your chest.  Crushing, pressure, aching, or squeezing pain.  Burning or tingling.  Dull or sharp pain that is worse when you move, cough, or take a deep breath.  Pain or discomfort that is also felt in your back, neck, jaw, shoulder, or arm, or pain that spreads to any of these areas. It is hard to know whether your pain is caused by something that is  serious or something that is not so serious. So it is important to see your doctor right away if you have chest pain. Follow these instructions at home: Medicines  Take over-the-counter and prescription medicines only as told by your doctor.  If you were prescribed an antibiotic medicine, take it as told by your doctor. Do not stop taking the antibiotic even if you start to feel better. Lifestyle   Rest as told by your doctor.  Do not use any products that contain nicotine or tobacco, such as cigarettes, e-cigarettes, and chewing tobacco. If you need help quitting, ask your doctor.  Do not drink alcohol.  Make lifestyle changes as told by your doctor. These may include: ? Getting regular exercise. Ask your doctor what activities are safe for you. ? Eating a heart-healthy diet. A diet and nutrition specialist (dietitian) can help you to learn healthy eating options. ? Staying at a healthy weight. ? Treating diabetes or high blood pressure, if needed. ? Lowering your stress. Activities such as yoga and relaxation techniques can help. General instructions  Pay attention to any changes in your symptoms. Tell your doctor about them or any new symptoms.  Avoid any activities that cause chest pain.  Keep all follow-up visits as told by your doctor. This is important. You may need more testing if your chest pain does not go away. Contact a doctor if:  Your chest pain does not go away.  You feel   depressed.  You have a fever. Get help right away if:  Your chest pain is worse.  You have a cough that gets worse, or you cough up blood.  You have very bad (severe) pain in your belly (abdomen).  You pass out (faint).  You have either of these for no clear reason: ? Sudden chest discomfort. ? Sudden discomfort in your arms, back, neck, or jaw.  You have shortness of breath at any time.  You suddenly start to sweat, or your skin gets clammy.  You feel sick to your stomach  (nauseous).  You throw up (vomit).  You suddenly feel lightheaded or dizzy.  You feel very weak or tired.  Your heart starts to beat fast, or it feels like it is skipping beats. These symptoms may be an emergency. Do not wait to see if the symptoms will go away. Get medical help right away. Call your local emergency services (911 in the [place] Do not drive yourself to the hospital. Summary  Chest pain can be caused by many different conditions. The cause may be serious and need treatment right away. If you have chest pain, see your doctor right away.  Follow your doctor's instructions for taking medicines and making lifestyle changes.  Keep all follow-up visits as told by your doctor. This includes visits for any further testing if your chest pain does not go away.  Be sure to know the signs that show that your condition has become worse. Get help right away if you have these symptoms. This information is not intended to replace advice given to you by your health care provider. Make sure you discuss any questions you have with your health care provider. Document Revised: 04/30/2018 Document Reviewed: 04/30/2018 Elsevier Patient Education  2020 Elsevier Inc.  

## 2020-11-06 ENCOUNTER — Other Ambulatory Visit: Payer: No Typology Code available for payment source

## 2020-11-09 ENCOUNTER — Ambulatory Visit: Payer: No Typology Code available for payment source | Admitting: Endocrinology

## 2020-11-13 ENCOUNTER — Other Ambulatory Visit: Payer: Self-pay | Admitting: Endocrinology

## 2020-11-13 DIAGNOSIS — E230 Hypopituitarism: Secondary | ICD-10-CM

## 2020-11-13 DIAGNOSIS — R7303 Prediabetes: Secondary | ICD-10-CM

## 2020-11-15 ENCOUNTER — Other Ambulatory Visit: Payer: Self-pay

## 2020-11-15 ENCOUNTER — Other Ambulatory Visit (INDEPENDENT_AMBULATORY_CARE_PROVIDER_SITE_OTHER): Payer: No Typology Code available for payment source

## 2020-11-15 DIAGNOSIS — R7303 Prediabetes: Secondary | ICD-10-CM

## 2020-11-15 DIAGNOSIS — E230 Hypopituitarism: Secondary | ICD-10-CM

## 2020-11-15 LAB — CBC
HCT: 50.9 % (ref 39.0–52.0)
Hemoglobin: 17.1 g/dL — ABNORMAL HIGH (ref 13.0–17.0)
MCHC: 33.6 g/dL (ref 30.0–36.0)
MCV: 85.1 fl (ref 78.0–100.0)
Platelets: 231 10*3/uL (ref 150.0–400.0)
RBC: 5.99 Mil/uL — ABNORMAL HIGH (ref 4.22–5.81)
RDW: 15.2 % (ref 11.5–15.5)
WBC: 6 10*3/uL (ref 4.0–10.5)

## 2020-11-15 LAB — GLUCOSE, RANDOM: Glucose, Bld: 111 mg/dL — ABNORMAL HIGH (ref 70–99)

## 2020-11-15 LAB — HEMOGLOBIN A1C: Hgb A1c MFr Bld: 6 % (ref 4.6–6.5)

## 2020-11-15 LAB — TESTOSTERONE: Testosterone: 219.43 ng/dL — ABNORMAL LOW (ref 300.00–890.00)

## 2020-11-20 ENCOUNTER — Ambulatory Visit: Payer: No Typology Code available for payment source | Admitting: Endocrinology

## 2020-11-20 ENCOUNTER — Other Ambulatory Visit: Payer: Self-pay

## 2020-11-20 ENCOUNTER — Encounter: Payer: Self-pay | Admitting: Endocrinology

## 2020-11-20 VITALS — BP 138/88 | HR 82 | Ht 75.0 in | Wt 276.4 lb

## 2020-11-20 DIAGNOSIS — E230 Hypopituitarism: Secondary | ICD-10-CM | POA: Diagnosis not present

## 2020-11-20 DIAGNOSIS — I10 Essential (primary) hypertension: Secondary | ICD-10-CM

## 2020-11-20 DIAGNOSIS — Z6834 Body mass index (BMI) 34.0-34.9, adult: Secondary | ICD-10-CM | POA: Diagnosis not present

## 2020-11-20 DIAGNOSIS — R7301 Impaired fasting glucose: Secondary | ICD-10-CM | POA: Diagnosis not present

## 2020-11-20 NOTE — Progress Notes (Signed)
Patient ID: Dustin Mckee, male   DOB: October 30, 1956, 65 y.o.   MRN: [id number]            Referring physician: Karyl Kinnier   Chief complaint: Endocrinology follow-up  History of Present Illness  Hypogonadism: Diagnosed in 2012 by his previous primary care physician when he was having complaints of fatigue and lethargy He does not know the details about his evaluation and no records are available However he was treated with a testosterone gel for several months with improvement in his symptoms He then stopped treatment on his own  He had had complaints offatigue, some lethargy, decreased motivation, decreased libido for a few months prior to his initial consultation He had testosterone levels levels that were significantly low  With his evaluation 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, prolactin upper normal He had been on CLOMIPHENE half tablet 3 times a week since early April 2017  RECENT history:  In 9/18 with taking clomiphene 3 times a week follow-up level was up to 326 Because of his complaints of  having some decreased energy level and libido, lower motivation level he was switched from clomiphene to testosterone gel 2 pumps daily With this change he started feeling much better with his energy level and libido  Testosterone level has gone down to as low as 127 in the past without treatment Has been prescribed AndroGel 1.62% 3 pumps daily  Testosterone level was 302 in 10/21 He was continued on AndroGel 2 pumps on 1 arm and 1 on the other However the weekend before his labs he missed applying his gel However he feels fairly good with his energy level and does not have tiredness or decreased libido as before  Testosterone level now is back at 219 However hemoglobin is high normal  His labs showtestosterone levels as below:  Lab Results  Component Value Date   TESTOSTERONE 219.43 (L) 11/15/2020    TESTOSTERONE 301.64 08/14/2020   TESTOSTERONE 163.11 (L) 06/29/2020   TESTOSTERONE 302.16 12/27/2019    Baseline free testosterone level was decreased at 19, normal  >35.8  Prolactin level: 15.4  Lab Results  Component Value Date   LH 6.04 03/20/2018   Lab Results  Component Value Date   HGB 17.1 (H) 11/15/2020    Diabetes screening: See review of systems     Allergies as of 11/20/2020      Reactions   Iodine Hives      Medication List       Accurate as of November 20, 2020  8:31 AM. If you have any questions, ask your nurse or doctor.        amLODipine 2.5 MG tablet Commonly known as: NORVASC TAKE 1 TABLET BY MOUTH EVERY DAY   hydrochlorothiazide 25 MG tablet Commonly known as: HYDRODIURIL TAKE 1 TABLET BY MOUTH EVERY DAY   MENS 50+ ADVANCED PO Take by mouth.   neomycin-polymyxin-hydrocortisone OTIC solution Commonly known as: CORTISPORIN Apply 1 to 2 drops to toe BID after soaking   rosuvastatin 20 MG tablet Commonly known as: CRESTOR TAKE 1 TABLET BY MOUTH EVERY DAY   tamsulosin 0.4 MG Caps capsule Commonly known as: FLOMAX Take 1 capsule (0.4 mg total) by mouth daily.   Testosterone 20.25 MG/ACT (1.62%) Gel APPLY 1 PUMP ON ONE SHOULDER AND 2 ON THE OTHER ONCE DAILY AND ALLOW TO DRY       Allergies:  Allergies  Allergen Reactions  . Iodine Hives  Past Medical History:  Diagnosis Date  . Allergy   . Hyperlipidemia   . Hypertension   . Prediabetes   . Sleep apnea    CPAP    Past Surgical History:  Procedure Laterality Date  . APPENDECTOMY    . TOOTH EXTRACTION    . VASECTOMY      Family History  Problem Relation Age of Onset  . Heart disease Mother   . Kidney disease Mother   . Heart disease Father        defibrillator/CHF.  . Diabetes Father   . Heart disease Sister        CHF  . Diabetes Sister   . Heart disease Sister        CHF    Social History:  reports that he has never smoked. He has never used smokeless  tobacco. He reports that he does not drink alcohol and does not use drugs.  Review of Systems  He has had prediabetes with persistently high fasting glucose, last 111  A1c was higher in 2020 at 6.3 and now is stable at 6.0  Has had nutritional counseling as of 10/21 and he is wanting to try and switch to a vegan diet Not exercising regularly but is starting to do so now  His maximum weight has been about 290   Family history of diabetes   Wt Readings from Last 3 Encounters:  11/20/20 276 lb 6.4 oz (125.4 kg)  10/12/20 276 lb (125.2 kg)  08/21/20 265 lb (120.2 kg)   Lab Results  Component Value Date   HGBA1C 6.0 11/15/2020   HGBA1C 6.0 06/29/2020   HGBA1C 5.7 (H) 01/17/2020   Lab Results  Component Value Date   LDLCALC 79 01/17/2020   CREATININE 1.20 01/17/2020   Hypertension: His blood pressure is monitored by his PCP, due for visit  BP Readings from Last 3 Encounters:  11/20/20 138/88  10/12/20 138/85  07/03/20 140/80     General Examination:   BP 138/88   Pulse 82   Ht 6\' 3"  (1.905 m)   Wt 276 lb 6.4 oz (125.4 kg)   SpO2 98%   BMI 34.55 kg/m    Assessment/ Plan:  Hypogonadotropic Hypogonadism, 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 about 2012  He has had good control of his symptoms with testosterone supplementation He has had normal levels with 3 pumps of AndroGel but recent level was relatively low because of missing a couple of doses  However since his hemoglobin is 17.1 his dose will be limited  Since he is symptomatically better he will continue the same dose of 3 pumps daily and apply regularly  Hemoglobin to be followed up with PCP  Impaired fasting glucose: Unchanged, fasting glucose 111  A1c is still 6%, may consider metformin if fasting glucose goes above 120  Hypertension: Currently managed with HCTZ 25 mg and 2.5 amlodipine Recommend that he try lower dose of HCTZ and amlodipine could be increased, this  would be beneficial for his insulin resistance syndrome  Follow-up in 6 months   Caress Reffitt 11/20/2020, 8:31 AM

## 2021-01-08 ENCOUNTER — Other Ambulatory Visit: Payer: Self-pay | Admitting: Family Medicine

## 2021-01-08 DIAGNOSIS — I10 Essential (primary) hypertension: Secondary | ICD-10-CM

## 2021-01-08 DIAGNOSIS — E785 Hyperlipidemia, unspecified: Secondary | ICD-10-CM

## 2021-04-03 ENCOUNTER — Other Ambulatory Visit: Payer: Self-pay | Admitting: Endocrinology

## 2021-04-13 ENCOUNTER — Other Ambulatory Visit: Payer: Self-pay | Admitting: Family Medicine

## 2021-04-13 DIAGNOSIS — I10 Essential (primary) hypertension: Secondary | ICD-10-CM

## 2021-04-13 DIAGNOSIS — E785 Hyperlipidemia, unspecified: Secondary | ICD-10-CM

## 2021-05-07 ENCOUNTER — Ambulatory Visit: Payer: No Typology Code available for payment source | Admitting: Family Medicine

## 2021-05-11 ENCOUNTER — Other Ambulatory Visit: Payer: Self-pay

## 2021-05-11 ENCOUNTER — Encounter: Payer: Self-pay | Admitting: Family Medicine

## 2021-05-11 ENCOUNTER — Ambulatory Visit: Payer: No Typology Code available for payment source | Admitting: Family Medicine

## 2021-05-11 DIAGNOSIS — M722 Plantar fascial fibromatosis: Secondary | ICD-10-CM | POA: Diagnosis not present

## 2021-05-11 MED ORDER — NITROGLYCERIN 0.1 MG/HR TD PT24
MEDICATED_PATCH | TRANSDERMAL | 2 refills | Status: DC
Start: 2021-05-11 — End: 2022-10-24

## 2021-05-11 NOTE — Assessment & Plan Note (Signed)
Plantar fasciitis of right foot. Recommend conservative approach with stretching exercises, ice. Will also try nitroglycerin patch, gave instructions for use. Discussed more aggressive option with [facility] if no improvement with this therapy. Patient to follow up in 1 month.

## 2021-05-11 NOTE — Progress Notes (Addendum)
[facility] Attending Note: I have seen and examined this patient. I have discussed this patient with the resident and reviewed the assessment and plan as documented above. I agree with the resident's findings and plan.  Plan fasciitis of the right foot.  He has mild pes planus.  He also has some overpronation at the subtalar joint.  We discussed options.  He would like to start with conservative measures.  Specifically, he wanted to avoid injection if he could.  He was however interested in nitroglycerin patch.  Insertion of scaphoid pad on green insole for the right shoe.  I think this will give him some mechanical improvement.  Nitroglycerin patch.  Stretches and icing.  Follow-up 2 to 4 weeks.  Follow-up in the interim if he has any new or worsening symptoms.  He is okay with this plan.  Elevated blood pressure on today's visit.  I will let him follow-up with his PCP regarding that.

## 2021-05-11 NOTE — Progress Notes (Signed)
    SUBJECTIVE:   CHIEF COMPLAINT / HPI: right foot pain  65 yo man presents with right foot pain. This has been present for several months, located on plantar aspect of foot. He has aching pain and tenderness that is worst in the morning, but can be present throughout the day. Due to pain he has been walking and exercising less. He gets some relief with aleve, but it is short lived.   PERTINENT  PMH / PSH: non-contributory  OBJECTIVE:   BP (!) 149/84   Ht 6\' 3"  (1.905 m)   Wt 260 lb (117.9 kg)   BMI 32.50 kg/m   Nursing note and vitals reviewed GEN: age-approprate, AAM, resting comfortably in chair, NAD, WNWD, alert and at baseline R foot: No visible erythema, swelling, ecchymosis, or bony deformity. No notable pes planus/cavus deformity. Transverse arch grossly intact; Range of motion is full in all directions. Strength is 5/5 in all directions. No tenderness at the insertion/body/myotendinous junction of the Achilles tendon; No tenderness on posterior aspects of lateral and medial malleolus; Stable lateral and medial ligaments; Plantar tenderness on lateral edge of foot; No tenderness over the navicular prominence; No tenderness over cuboid; No pain at base of 5th MT; No tenderness at the distal metatarsals; Able to walk 4 steps. + DP, achilles DTR intact. Ext: no edema Psych: Pleasant and appropriate   ASSESSMENT/PLAN:   Plantar fasciitis of right foot Plantar fasciitis of right foot. Recommend conservative approach with stretching exercises, ice. Will also try nitroglycerin patch, gave instructions for use. Discussed more aggressive option with CSI if no improvement with this therapy. Patient to follow up in 1 month.     , MD [facility]

## 2021-05-11 NOTE — Patient Instructions (Addendum)
It was a pleasure to see you today!  Plantar fasciitis: for your foot pain we recommend using the inserts given to you, using the nitro patch as described below, and stretching exercises. Follow up in 3-4 weeks with Dr. Jennette Kettle. Rosana Fret patch instructions: You may experience a headache during the first 1-2 days and maybe up to 2 weeks of using the patch; these should improve and go away. If you experience headaches after beginning nitroglycerin patch treatment, you may take your preferred over the counter pain medicine such as tylenol or advil as directed on the box. Another side effect of the nitroglycerin patch can be skin irritation  or rash related to the patch adhesive.  Please notify our office if you develop  severe headaches or rash, and stop the patch.  Please call our office with any questions or problems. Tendon healing with nitroglycerin patch may require 12 to 24 weeks depending on the extent of injury. Men should not use if taking Viagra, Cialis, or Levitra as it may cause an unsafe lowering of the blood pressure..  Use with caution if you have migraines or rosacea.   Be Well,  Dr. Leary Roca

## 2021-05-13 ENCOUNTER — Other Ambulatory Visit: Payer: Self-pay | Admitting: Family Medicine

## 2021-05-13 DIAGNOSIS — E785 Hyperlipidemia, unspecified: Secondary | ICD-10-CM

## 2021-05-13 DIAGNOSIS — I10 Essential (primary) hypertension: Secondary | ICD-10-CM

## 2021-05-21 ENCOUNTER — Other Ambulatory Visit (INDEPENDENT_AMBULATORY_CARE_PROVIDER_SITE_OTHER): Payer: No Typology Code available for payment source

## 2021-05-21 ENCOUNTER — Other Ambulatory Visit: Payer: Self-pay

## 2021-05-21 DIAGNOSIS — I10 Essential (primary) hypertension: Secondary | ICD-10-CM

## 2021-05-21 DIAGNOSIS — R7301 Impaired fasting glucose: Secondary | ICD-10-CM | POA: Diagnosis not present

## 2021-05-21 DIAGNOSIS — E230 Hypopituitarism: Secondary | ICD-10-CM | POA: Diagnosis not present

## 2021-05-21 LAB — CBC
HCT: 52.4 % — ABNORMAL HIGH (ref 39.0–52.0)
Hemoglobin: 17.9 g/dL — ABNORMAL HIGH (ref 13.0–17.0)
MCHC: 34.1 g/dL (ref 30.0–36.0)
MCV: 84.5 fl (ref 78.0–100.0)
Platelets: 240 10*3/uL (ref 150.0–400.0)
RBC: 6.21 Mil/uL — ABNORMAL HIGH (ref 4.22–5.81)
RDW: 15.6 % — ABNORMAL HIGH (ref 11.5–15.5)
WBC: 6.5 10*3/uL (ref 4.0–10.5)

## 2021-05-21 LAB — BASIC METABOLIC PANEL
BUN: 15 mg/dL (ref 6–23)
CO2: 27 mEq/L (ref 19–32)
Calcium: 9.7 mg/dL (ref 8.4–10.5)
Chloride: 101 mEq/L (ref 96–112)
Creatinine, Ser: 1.23 mg/dL (ref 0.40–1.50)
GFR: 62.77 mL/min (ref >60.00)
Glucose, Bld: 110 mg/dL — ABNORMAL HIGH (ref 70–99)
Potassium: 3.7 mEq/L (ref 3.5–5.1)
Sodium: 139 mEq/L (ref 135–145)

## 2021-05-21 LAB — TESTOSTERONE: Testosterone: 415.13 ng/dL (ref 300.00–890.00)

## 2021-05-21 LAB — HEMOGLOBIN A1C: Hgb A1c MFr Bld: 5.9 % (ref 4.6–6.5)

## 2021-05-23 NOTE — Progress Notes (Signed)
Patient ID: Dustin Mckee, male   DOB: November 11, 1956, 65 y.o.   MRN: [id number]            Referring physician: Karyl Kinnier   Chief complaint: Endocrinology follow-up  History of Present Illness  Hypogonadism: Diagnosed in 2012 by his previous primary care physician when he was having complaints of fatigue and lethargy He does not know the details about his evaluation and no records are available However he was treated with a testosterone gel for several months with improvement in his symptoms He then stopped treatment on his own  He had had complaints of fatigue, some lethargy, decreased motivation, decreased libido for a few months prior to his initial consultation He had testosterone levels levels that were significantly low  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, prolactin upper normal He had been on CLOMIPHENE half tablet 3 times a week since early April 2017  RECENT history:  In 9/18 with taking clomiphene 3 times a week follow-up level was up to 326 Because of his complaints of  having some decreased energy level and libido, lower motivation level he was switched from clomiphene to testosterone gel 2 pumps daily With this change he started feeling much better with his energy level and libido  Testosterone level has gone down to as low as 127 in the past without treatment Has been prescribed AndroGel 1.62% 3 pumps daily  Testosterone level was lower in 1/22 from missing a couple of doses He was continued on AndroGel 2 pumps on 1 arm and 1 on the other Has been using this consistently However he feels fairly good with his energy level and does not have tiredness or decreased libido as before  Testosterone level now is back to all normal at 415 compared to 219 However hemoglobin is high compared to previous levels  His labs show testosterone levels as below:  Lab Results  Component Value Date    TESTOSTERONE 415.13 05/21/2021   TESTOSTERONE 219.43 (L) 11/15/2020   TESTOSTERONE 301.64 08/14/2020   TESTOSTERONE 163.11 (L) 06/29/2020    Baseline free testosterone level was decreased at 19, normal  >35.8  Prolactin level: 15.4  Lab Results  Component Value Date   LH 6.04 03/20/2018   Lab Results  Component Value Date   HGB 17.9 (H) 05/21/2021    Diabetes screening: See review of systems      Allergies as of 05/24/2021       Reactions   Iodine Hives        Medication List        Accurate as of May 24, 2021 11:53 AM. If you have any questions, ask your nurse or doctor.          STOP taking these medications    Testosterone 20.25 MG/ACT (1.62%) Gel Replaced by: Natesto 5.5 MG/ACT Gel Stopped by: Reather Littler, MD       TAKE these medications    amLODipine 2.5 MG tablet Commonly known as: NORVASC TAKE 1 TABLET BY MOUTH EVERY DAY   hydrochlorothiazide 25 MG tablet Commonly known as: HYDRODIURIL TAKE 1 TABLET BY MOUTH EVERY DAY   MENS 50+ ADVANCED PO Take by mouth.   Natesto 5.5 MG/ACT Gel Generic drug: Testosterone Insert 1 dose in nostril 3 times a day Replaces: Testosterone 20.25 MG/ACT (1.62%) Gel Started by: Reather Littler, MD   nitroGLYCERIN 0.1 mg/hr patch Commonly known as: NITRODUR - Dosed in mg/24 hr Cut patch into  one - fourth pieces Place a one fourth piece of patch on  skin over affected area, changing to a new piece every 24 hours.   rosuvastatin 20 MG tablet Commonly known as: CRESTOR TAKE 1 TABLET BY MOUTH EVERY DAY   tamsulosin 0.4 MG Caps capsule Commonly known as: FLOMAX Take 1 capsule (0.4 mg total) by mouth daily.   telmisartan-hydrochlorothiazide 40-12.5 MG tablet Commonly known as: MICARDIS HCT Take 1 tablet by mouth daily. Started by: Reather Littler, MD        Allergies:  Allergies  Allergen Reactions   Iodine Hives    Past Medical History:  Diagnosis Date   Allergy    Hyperlipidemia    Hypertension     Prediabetes    Sleep apnea    CPAP    Past Surgical History:  Procedure Laterality Date   APPENDECTOMY     TOOTH EXTRACTION     VASECTOMY      Family History  Problem Relation Age of Onset   Heart disease Mother    Kidney disease Mother    Heart disease Father        defibrillator/CHF.   Diabetes Father    Heart disease Sister        CHF   Diabetes Sister    Heart disease Sister        CHF    Social History:  reports that he has never smoked. He has never used smokeless tobacco. He reports that he does not drink alcohol and does not use drugs.  Review of Systems  He has had prediabetes with persistently high fasting glucose, now 110 compared to 111  A1c was higher in 2020 at 6.3 and now is stable at 5.9 follow  Has had nutritional counseling as of 10/21 and he is wanting to try and switch to a vegan diet Not exercising regularly but is starting to do so now  His maximum weight has been about 290   Family history of diabetes   Wt Readings from Last 3 Encounters:  05/24/21 280 lb 9.6 oz (127.3 kg)  05/11/21 260 lb (117.9 kg)  11/20/20 276 lb 6.4 oz (125.4 kg)   Lab Results  Component Value Date   HGBA1C 5.9 05/21/2021   HGBA1C 6.0 11/15/2020   HGBA1C 6.0 06/29/2020   Lab Results  Component Value Date   LDLCALC 79 01/17/2020   CREATININE 1.23 05/21/2021   Hypertension: His blood pressure is monitored by his PCP  BP Readings from Last 3 Encounters:  05/24/21 (!) 160/96  05/11/21 (!) 149/84  11/20/20 138/88     General Examination:   BP (!) 160/96   Pulse 86   Ht 6\' 3"  (1.905 m)   Wt 280 lb 9.6 oz (127.3 kg)   SpO2 98%   BMI 35.07 kg/m    Assessment/ Plan:  Hypogonadotropic Hypogonadism, 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 about 2012  He has had good control of his symptoms with testosterone supplementation He has had normal levels with 3 pumps of AndroGel with good energy level  However since his  hemoglobin is 17.9 We will try him on Natesto instead of AndroGel and see if this will limit the rise in hemoglobin, discussed how this would be applied  Impaired fasting glucose: Stable with fasting glucose 110 and A1c in prediabetic range Does need to start an exercise program and further efforts to lose weight  Hypertension: Currently managed with HCTZ 25 mg and 2.5 amlodipine Has not  had any regular follow-up with PCP and his blood pressure is significantly higher today  He will be switched from HCTZ to Micardis HCT 40/12.5 and needs follow-up with PCP soon    Reather Littler 05/24/2021, 11:53 AM

## 2021-05-24 ENCOUNTER — Other Ambulatory Visit: Payer: Self-pay

## 2021-05-24 ENCOUNTER — Ambulatory Visit: Payer: No Typology Code available for payment source | Admitting: Endocrinology

## 2021-05-24 ENCOUNTER — Encounter: Payer: Self-pay | Admitting: Endocrinology

## 2021-05-24 VITALS — BP 160/96 | HR 86 | Ht 75.0 in | Wt 280.6 lb

## 2021-05-24 DIAGNOSIS — R7301 Impaired fasting glucose: Secondary | ICD-10-CM | POA: Diagnosis not present

## 2021-05-24 DIAGNOSIS — I10 Essential (primary) hypertension: Secondary | ICD-10-CM

## 2021-05-24 DIAGNOSIS — D751 Secondary polycythemia: Secondary | ICD-10-CM | POA: Diagnosis not present

## 2021-05-24 DIAGNOSIS — E230 Hypopituitarism: Secondary | ICD-10-CM | POA: Diagnosis not present

## 2021-05-24 MED ORDER — TELMISARTAN-HCTZ 40-12.5 MG PO TABS: 1.0000 | ORAL_TABLET | Freq: Every day | ORAL | 2 refills | Status: DC

## 2021-05-24 MED ORDER — NATESTO 5.5 MG/ACT NA GEL: NASAL | 2 refills | Status: DC

## 2021-05-24 NOTE — Patient Instructions (Addendum)
HCTZ stopped  Natesto: Check copay card online

## 2021-06-07 ENCOUNTER — Other Ambulatory Visit: Payer: Self-pay | Admitting: Family Medicine

## 2021-06-07 DIAGNOSIS — E785 Hyperlipidemia, unspecified: Secondary | ICD-10-CM

## 2021-06-07 DIAGNOSIS — I10 Essential (primary) hypertension: Secondary | ICD-10-CM

## 2021-06-15 ENCOUNTER — Ambulatory Visit: Payer: No Typology Code available for payment source | Admitting: Family Medicine

## 2021-06-15 ENCOUNTER — Other Ambulatory Visit: Payer: Self-pay

## 2021-06-15 ENCOUNTER — Encounter: Payer: Self-pay | Admitting: Family Medicine

## 2021-06-15 DIAGNOSIS — M722 Plantar fascial fibromatosis: Secondary | ICD-10-CM | POA: Diagnosis not present

## 2021-06-15 NOTE — Assessment & Plan Note (Signed)
Significantly improved with the nitroglycerin patch therapy and the temporary insoles with scaphoid pad.  He estimates 70 to 75% better.  Recommend continuing nitroglycerin patch for about another 3 weeks and continuing the insole long-term.  After discontinuation of nitroglycerin patch, if his pain returns then I would recommend an additional 4 weeks of that.  I would recommend long-term use of the insert and we could potentially replace that when it wears out or we can make him some custom molded orthotics.  He will follow-up as needed.

## 2021-06-15 NOTE — Progress Notes (Signed)
  Dustin Mckee - 65 y.o. male MRN [id number]  Date of birth: [birth year]    SUBJECTIVE:      Chief Complaint:/ HPI:  Right foot pain secondary to plantar fasciitis Much better.  He says potentially even 70 to 75% better.  Still has some pain first thing in the morning.  He is very pleased.  Wonders what our next steps are.    OBJECTIVE: BP (!) 140/92   Ht 6\' 3"  (1.905 m)   Wt 260 lb (117.9 kg)   BMI 32.50 kg/m   Physical Exam:  Vital signs are reviewed. GENERAL: Well-developed male no acute distress GAIT: Normal  ASSESSMENT & PLAN:  See problem based charting & AVS for pt instructions. No problem-specific Assessment & Plan notes found for this encounter.

## 2021-07-15 ENCOUNTER — Other Ambulatory Visit: Payer: Self-pay | Admitting: Family Medicine

## 2021-07-15 DIAGNOSIS — E785 Hyperlipidemia, unspecified: Secondary | ICD-10-CM

## 2021-07-19 ENCOUNTER — Other Ambulatory Visit: Payer: Self-pay | Admitting: Family Medicine

## 2021-07-19 DIAGNOSIS — E785 Hyperlipidemia, unspecified: Secondary | ICD-10-CM

## 2021-07-25 ENCOUNTER — Other Ambulatory Visit (INDEPENDENT_AMBULATORY_CARE_PROVIDER_SITE_OTHER): Payer: No Typology Code available for payment source

## 2021-07-25 ENCOUNTER — Other Ambulatory Visit: Payer: Self-pay

## 2021-07-25 DIAGNOSIS — D751 Secondary polycythemia: Secondary | ICD-10-CM

## 2021-07-25 DIAGNOSIS — E230 Hypopituitarism: Secondary | ICD-10-CM | POA: Diagnosis not present

## 2021-07-25 DIAGNOSIS — I10 Essential (primary) hypertension: Secondary | ICD-10-CM

## 2021-07-25 LAB — BASIC METABOLIC PANEL
BUN: 16 mg/dL (ref 6–23)
CO2: 26 mEq/L (ref 19–32)
Calcium: 9.7 mg/dL (ref 8.4–10.5)
Chloride: 102 mEq/L (ref 96–112)
Creatinine, Ser: 1.31 mg/dL (ref 0.40–1.50)
GFR: 58.13 mL/min — ABNORMAL LOW (ref >60.00)
Glucose, Bld: 109 mg/dL — ABNORMAL HIGH (ref 70–99)
Potassium: 3.9 mEq/L (ref 3.5–5.1)
Sodium: 138 mEq/L (ref 135–145)

## 2021-07-25 LAB — CBC
HCT: 49.5 % (ref 39.0–52.0)
Hemoglobin: 16.5 g/dL (ref 13.0–17.0)
MCHC: 33.4 g/dL (ref 30.0–36.0)
MCV: 85.9 fl (ref 78.0–100.0)
Platelets: 216 10*3/uL (ref 150.0–400.0)
RBC: 5.76 Mil/uL (ref 4.22–5.81)
RDW: 14.9 % (ref 11.5–15.5)
WBC: 4.6 10*3/uL (ref 4.0–10.5)

## 2021-07-25 LAB — TESTOSTERONE: Testosterone: 294.91 ng/dL — ABNORMAL LOW (ref 300.00–890.00)

## 2021-07-31 ENCOUNTER — Other Ambulatory Visit: Payer: No Typology Code available for payment source

## 2021-08-02 NOTE — Progress Notes (Signed)
Patient ID: Dustin Mckee, male   DOB: 03/27/1956, 65 y.o.   MRN: [id number]            Chief complaint: Endocrinology follow-up  History of Present Illness  Hypogonadism: Diagnosed in 2012 by his previous primary care physician when he was having complaints of fatigue and lethargy He does not know the details about his evaluation and no records are available However he was treated with a testosterone gel for several months with improvement in his symptoms He then stopped treatment on his own  He had had complaints of fatigue, some lethargy, decreased motivation, decreased libido for a few months prior to his initial consultation He had testosterone levels levels that were significantly low  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, prolactin upper normal He had been on CLOMIPHENE half tablet 3 times a week since early April 2017  RECENT history:  Testosterone level has gone down to as low as 127 in the past without treatment  In 9/18 with taking clomiphene 3 times a week follow-up level was up to 326 Because of his complaints of  having some decreased energy level and libido, lower motivation level he was switched from clomiphene to testosterone gel 2 pumps daily With this change he started feeling much better with his energy level and libido  Previously was taking AndroGel 1.62% 3 pumps daily This was changed to [facility] because of high hemoglobin He is taking this twice a day and has no difficulty using it or any local irritation  Recently he feels fairly good with complaints of decreased stamina or fatigue, no decreased libido  Testosterone level now is back to below 300 compared to 415  Previously hemoglobin was high compared to previous levels and now back down to normal levels  His labs show testosterone levels as below:  Lab Results  Component Value Date   TESTOSTERONE 294.91 (L) 07/25/2021    TESTOSTERONE 415.13 05/21/2021   TESTOSTERONE 219.43 (L) 11/15/2020   TESTOSTERONE 301.64 08/14/2020    Baseline free testosterone level was decreased at 19, normal  >35.8  Prolactin level: 15.4  Lab Results  Component Value Date   LH 6.04 03/20/2018   Lab Results  Component Value Date   HGB 16.5 07/25/2021    Diabetes screening: See review of systems      Allergies as of 08/03/2021       Reactions   Iodine Hives        Medication List        Accurate as of August 03, 2021  9:51 AM. If you have any questions, ask your nurse or doctor.          STOP taking these medications    hydrochlorothiazide 25 MG tablet Commonly known as: HYDRODIURIL Stopped by: Reather Littler, MD   tamsulosin 0.4 MG Caps capsule Commonly known as: FLOMAX Stopped by: Reather Littler, MD       TAKE these medications    amLODipine 2.5 MG tablet Commonly known as: NORVASC TAKE 1 TABLET BY MOUTH EVERY DAY   MENS 50+ ADVANCED PO Take by mouth.   Natesto 5.5 MG/ACT Gel Generic drug: Testosterone Insert 1 dose in nostril 3 times a day   nitroGLYCERIN 0.1 mg/hr patch Commonly known as: NITRODUR - Dosed in mg/24 hr Cut patch into one - fourth pieces Place a one fourth piece of patch on  skin over affected area, changing to a new piece every 24  hours.   rosuvastatin 20 MG tablet Commonly known as: CRESTOR Take 1 tablet (20 mg total) by mouth daily.   telmisartan-hydrochlorothiazide 40-12.5 MG tablet Commonly known as: MICARDIS HCT Take 1 tablet by mouth daily.        Allergies:  Allergies  Allergen Reactions   Iodine Hives    Past Medical History:  Diagnosis Date   Allergy    Hyperlipidemia    Hypertension    Prediabetes    Sleep apnea    CPAP    Past Surgical History:  Procedure Laterality Date   APPENDECTOMY     TOOTH EXTRACTION     VASECTOMY      Family History  Problem Relation Age of Onset   Heart disease Mother    Kidney disease Mother    Heart  disease Father        defibrillator/CHF.   Diabetes Father    Heart disease Sister        CHF   Diabetes Sister    Heart disease Sister        CHF    Social History:  reports that he has never smoked. He has never used smokeless tobacco. He reports that he does not drink alcohol and does not use drugs.  Review of Systems  He has had prediabetes with persistently high fasting glucose, now 109  A1c was higher in 2020 at 6.3 and last is stable at 5.9   Has had nutritional counseling as of 10/21  He thinks he is cutting back on carbohydrates generally  Not exercising regularly because of plantar fasciitis   His maximum weight has been about 290 and weight on his last visit was 280   Family history of diabetes   Wt Readings from Last 3 Encounters:  08/03/21 277 lb 9.6 oz (125.9 kg)  06/15/21 260 lb (117.9 kg)  05/24/21 280 lb 9.6 oz (127.3 kg)   Lab Results  Component Value Date   HGBA1C 5.9 05/21/2021   HGBA1C 6.0 11/15/2020   HGBA1C 6.0 06/29/2020   Lab Results  Component Value Date   LDLCALC 79 01/17/2020   CREATININE 1.31 07/25/2021   Hypertension: His blood pressure previously treated by PCP but on his last visit his blood pressure was significantly high and he is now taking Micardis HCT instead of HCT alone  Home BP 120/80-90  BP Readings from Last 3 Encounters:  08/03/21 130/80  06/15/21 (!) 140/92  05/24/21 (!) 160/96     General Examination:   BP 130/80 (Cuff Size: Large)   Pulse 80   Ht 6\' 3"  (1.905 m)   Wt 277 lb 9.6 oz (125.9 kg)   SpO2 94%   BMI 34.70 kg/m    Assessment/ Plan:  Hypogonadotropic Hypogonadism, 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 about 2012  He has had good control of his symptoms with testosterone supplementation He has had normal levels with 3 pumps of AndroGel with good energy level but he had a high hemoglobin of 17.9 With using Natesto instead of AndroGel he is subjectively still doing  better  His testosterone level is just below 300  However his hemoglobin is back to normal   He is agreeable to trying the Natesto 3 times a day but have a lab for testosterone done in another month  Impaired fasting glucose: Stable with fasting glucose 109 Previously A1c in prediabetic range Does need to start an exercise program with walking when he is able to Reminded him to  continue watching carbohydrates and portions for weight loss  Hypertension: Currently managed with Micardis HCT and 2.5 amlodipine  Blood pressure is better controlled now and he will continue the same regimen  History of dyslipidemia: He has been getting Crestor from his PCP and since he does not have a new prescription will renew this for him  Given a list of PCPs to establish with    Reather Littler 08/03/2021, 9:51 AM

## 2021-08-03 ENCOUNTER — Encounter: Payer: Self-pay | Admitting: Endocrinology

## 2021-08-03 ENCOUNTER — Ambulatory Visit: Payer: No Typology Code available for payment source | Admitting: Endocrinology

## 2021-08-03 ENCOUNTER — Other Ambulatory Visit: Payer: Self-pay

## 2021-08-03 VITALS — BP 130/80 | HR 80 | Ht 75.0 in | Wt 277.6 lb

## 2021-08-03 DIAGNOSIS — E785 Hyperlipidemia, unspecified: Secondary | ICD-10-CM

## 2021-08-03 DIAGNOSIS — R7301 Impaired fasting glucose: Secondary | ICD-10-CM | POA: Diagnosis not present

## 2021-08-03 DIAGNOSIS — I10 Essential (primary) hypertension: Secondary | ICD-10-CM | POA: Diagnosis not present

## 2021-08-03 DIAGNOSIS — E230 Hypopituitarism: Secondary | ICD-10-CM | POA: Diagnosis not present

## 2021-08-03 MED ORDER — ROSUVASTATIN CALCIUM 20 MG PO TABS: 20.0000 mg | ORAL_TABLET | Freq: Every day | ORAL | 1 refills | Status: DC

## 2021-08-18 ENCOUNTER — Other Ambulatory Visit: Payer: Self-pay | Admitting: Endocrinology

## 2021-08-29 ENCOUNTER — Ambulatory Visit
Admission: RE | Admit: 2021-08-29 | Discharge: 2021-08-29 | Disposition: A | Discharge status: Home / Self Care | Payer: No Typology Code available for payment source | Source: Ambulatory Visit | Attending: Emergency Medicine | Admitting: Emergency Medicine

## 2021-08-29 ENCOUNTER — Other Ambulatory Visit: Payer: Self-pay

## 2021-08-29 VITALS — BP 175/95 | HR 70 | Temp 98.5°F | Resp 18 | Wt 277.0 lb

## 2021-08-29 DIAGNOSIS — R0602 Shortness of breath: Secondary | ICD-10-CM

## 2021-08-29 DIAGNOSIS — U071 COVID-19: Secondary | ICD-10-CM

## 2021-08-29 DIAGNOSIS — R0789 Other chest pain: Secondary | ICD-10-CM

## 2021-08-29 DIAGNOSIS — I10 Essential (primary) hypertension: Secondary | ICD-10-CM | POA: Diagnosis not present

## 2021-08-29 NOTE — ED Provider Notes (Signed)
[facility]    CSN: [id number] Arrival date & time: 08/29/21  0827      History   Chief Complaint Chief Complaint  Patient presents with   Cough    HPI RANKIN COOLMAN is a 65 y.o. male.   Patient reports positive COVID test on August 25, 2021.  Patient states he is overall feeling better however he woke up yesterday morning with a sensation of chest pressure and some shortness of breath and then experienced the same sensation again last night, states each episode lasted about an hour and did resolve on their own.  Patient reports a 2-year history of treatment for hypertension, patient states he surprised to see his blood pressure as high as it is today.  It is unknown when patient actually began to have high blood pressure.  Patient has been prescribed amlodipine, telmisartan hydrochlorothiazide for his blood pressure, he also has a prescription for nitroglycerin tablets in his chart.  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  States that he is a never smoker.  The history is provided by the patient.   Past Medical History:  Diagnosis Date   Allergy    Hyperlipidemia    Hypertension    Prediabetes    Sleep apnea    CPAP    Patient Active Problem List   Diagnosis Date Noted   Plantar fasciitis of right foot 05/11/2021   Urinary frequency 05/20/2018   Obstructive sleep apnea treated with continuous positive airway pressure (CPAP) 01/13/2018   Excessive daytime sleepiness 10/15/2017   Seasonal allergic rhinitis due to pollen 10/15/2017   Hyperlipidemia 09/05/2014   Vitamin D deficiency 06/14/2014   Benign prostatic hyperplasia with urinary frequency 06/14/2014   GERD 01/23/2009   HYPERLIPIDEMIA 12/14/2008   SLEEP APNEA 11/29/2008    Past Surgical History:  Procedure Laterality Date   APPENDECTOMY     TOOTH EXTRACTION     VASECTOMY         Home Medications    Prior to Admission medications   Medication Sig Start Date  End Date Taking? Authorizing Provider  amLODipine (NORVASC) 2.5 MG tablet TAKE 1 TABLET BY MOUTH EVERY DAY 05/15/21   Shade Flood, MD  Multiple Vitamins-Minerals (MENS 50+ ADVANCED PO) Take by mouth.    [provider]  nitroGLYCERIN (NITRODUR - DOSED IN MG/24 HR) 0.1 mg/hr patch Cut patch into one - fourth pieces Place a one fourth piece of patch on  skin over affected area, changing to a new piece every 24 hours. 05/11/21   Shirlean Mylar, MD  rosuvastatin (CRESTOR) 20 MG tablet Take 1 tablet (20 mg total) by mouth daily. 08/03/21   Reather Littler, MD  telmisartan-hydrochlorothiazide (MICARDIS HCT) 40-12.5 MG tablet TAKE 1 TABLET BY MOUTH EVERY DAY 08/20/21   Reather Littler, MD  Testosterone (NATESTO) 5.5 MG/ACT GEL Insert 1 dose in nostril 3 times a day 05/24/21   Reather Littler, MD    Family History Family History  Problem Relation Age of Onset   Heart disease Mother    Kidney disease Mother    Heart disease Father        defibrillator/CHF.   Diabetes Father    Heart disease Sister        CHF   Diabetes Sister    Heart disease Sister        CHF    Social History Social History   Tobacco Use   Smoking status: Never   Smokeless tobacco:  Never  Substance Use Topics   Alcohol use: No   Drug use: No     Allergies   Iodine   Review of Systems Review of Systems Pertinent findings noted in history of present illness.    Physical Exam Triage Vital Signs ED Triage Vitals  Enc Vitals Group     BP      Pulse      Resp      Temp      Temp src      SpO2      Weight      Height      Head Circumference      Peak Flow      Pain Score      Pain Loc      Pain Edu?      Excl. in GC?    No data found.  Updated Vital Signs BP (!) 175/95 (BP Location: Right Arm) Comment: pt taking OTC cold meds. advised this can increase BP.  Pulse 70   Temp 98.5 F (36.9 C) (Oral)   Resp 18   Wt 277 lb (125.6 kg)   SpO2 96%   BMI 34.62 kg/m   Visual Acuity Right Eye  Distance:   Left Eye Distance:   Bilateral Distance:    Right Eye Near:   Left Eye Near:    Bilateral Near:     Physical Exam Vitals and nursing note reviewed.  Constitutional:      Appearance: Normal appearance.  HENT:     Head: Normocephalic and atraumatic.     Right Ear: Tympanic membrane, ear canal and external ear normal.     Left Ear: Tympanic membrane, ear canal and external ear normal.     Nose: Nose normal.     Mouth/Throat:     Mouth: Mucous membranes are moist.     Pharynx: Oropharynx is clear.  Eyes:     Extraocular Movements: Extraocular movements intact.     Conjunctiva/sclera: Conjunctivae normal.     Pupils: Pupils are equal, round, and reactive to light.  Neck:     Vascular: No carotid bruit or JVD.  Cardiovascular:     Rate and Rhythm: Normal rate and regular rhythm.     Chest Wall: No thrill.     Pulses: Normal pulses.     Heart sounds: Normal heart sounds, S1 normal and S2 normal. No murmur heard. Pulmonary:     Effort: Pulmonary effort is normal.     Breath sounds: Normal breath sounds. No wheezing, rhonchi or rales.  Abdominal:     Palpations: Abdomen is soft.  Musculoskeletal:     Cervical back: Normal range of motion and neck supple.     Right lower leg: No edema.     Left lower leg: No edema.  Lymphadenopathy:     Cervical: No cervical adenopathy.  Skin:    General: Skin is warm and dry.  Neurological:     General: No focal deficit present.     Mental Status: He is alert and oriented to person, place, and time.  Psychiatric:        Mood and Affect: Mood normal.        Behavior: Behavior normal.     UC Treatments / Results  Labs (all labs ordered are listed, but only abnormal results are displayed) Labs Reviewed - No data to display  EKG   Radiology No results found.  Procedures Procedures (including critical care time)  Medications Ordered in  UC Medications - No data to display  Initial Impression / Assessment and Plan /  UC Course  I have reviewed the triage vital signs and the nursing notes.  Pertinent labs & imaging results that were available during my care of the patient were reviewed by me and considered in my medical decision making (see chart for details).     EKG today is normal, patient reassured.  Physical exam is unremarkable.  We talked about his blood pressure and reducing his sodium intake.  I also provided him with a peak flow meter so that he could perform breathing exercises at home to help him prove his perceived work of breathing.  Also encouraged him to follow up with his primary care provider in the next few weeks to recheck his progress.  Patient verbalized understanding and agreement of plan as discussed.  All questions were addressed during visit.  Please see discharge instructions below for further details of plan.  Final Clinical Impressions(s) / UC Diagnoses   Final diagnoses:  Sensation of chest pressure  Essential hypertension  Shortness of breath  COVID-19     Discharge Instructions      COVID recovery can sometimes take several weeks.  At this time, I believe you are doing well.  Your lungs sound very good and your EKG today was unchanged from previous.  Please continue to work on your blood pressure by avoiding excess salt in your diet and taking all your medications as prescribed.  We have provided you with a peak flow meter which should help improve your lung function with regular use, please use 4 times daily, 10 reps each time.  Follow-up with your primary care provider in the next week or 2 just to ensure that you are making good progress in your recovery.     ED Prescriptions   None    PDMP not reviewed this encounter.   Theadora Rama Scales, [place] 08/31/21 [phone]

## 2021-08-29 NOTE — ED Triage Notes (Signed)
Pt states he tested positive for covid on Saturday. Still having cough and sore throat and chest congestion. Afebrile and taking OTC cold meds.

## 2021-08-29 NOTE — Discharge Instructions (Addendum)
COVID recovery can sometimes take several weeks.  At this time, I believe you are doing well.  Your lungs sound very good and your EKG today was unchanged from previous.  Please continue to work on your blood pressure by avoiding excess salt in your diet and taking all your medications as prescribed.  We have provided you with a peak flow meter which should help improve your lung function with regular use, please use 4 times daily, 10 reps each time.  Follow-up with your primary care provider in the next week or 2 just to ensure that you are making good progress in your recovery.

## 2021-09-06 ENCOUNTER — Other Ambulatory Visit (INDEPENDENT_AMBULATORY_CARE_PROVIDER_SITE_OTHER): Payer: No Typology Code available for payment source

## 2021-09-06 ENCOUNTER — Other Ambulatory Visit: Payer: Self-pay

## 2021-09-06 DIAGNOSIS — E230 Hypopituitarism: Secondary | ICD-10-CM

## 2021-11-16 ENCOUNTER — Other Ambulatory Visit: Payer: Self-pay | Admitting: Endocrinology

## 2021-12-03 ENCOUNTER — Other Ambulatory Visit: Payer: No Typology Code available for payment source

## 2021-12-03 ENCOUNTER — Other Ambulatory Visit: Payer: Self-pay | Admitting: Endocrinology

## 2021-12-03 DIAGNOSIS — I10 Essential (primary) hypertension: Secondary | ICD-10-CM

## 2021-12-03 DIAGNOSIS — E230 Hypopituitarism: Secondary | ICD-10-CM

## 2021-12-03 DIAGNOSIS — R7301 Impaired fasting glucose: Secondary | ICD-10-CM

## 2021-12-05 NOTE — Progress Notes (Deleted)
Patient ID: Dustin Mckee, male   DOB: [birth year], 66 y.o.   MRN: [id number]            Chief complaint: Endocrinology follow-up  History of Present Illness  Hypogonadism: Diagnosed in 2012 by his previous primary care physician when he was having complaints of fatigue and lethargy He does not know the details about his evaluation and no records are available However he was treated with a testosterone gel for several months with improvement in his symptoms He then stopped treatment on his own  He had had complaints of fatigue, some lethargy, decreased motivation, decreased libido for a few months prior to his initial consultation He had testosterone levels levels that were significantly low  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, prolactin upper normal He had been on CLOMIPHENE half tablet 3 times a week since early April 2017  RECENT history:  Testosterone level has gone down to as low as 127 in the past without treatment  In 9/18 with taking clomiphene 3 times a week follow-up level was up to 326 Because of his complaints of  having some decreased energy level and libido, lower motivation level he was switched from clomiphene to testosterone gel 2 pumps daily With this change he started feeling much better with his energy level and libido  Previously was taking AndroGel 1.62% 3 pumps daily This was changed to [facility] because of high hemoglobin He is taking this twice a day and has no difficulty using it or any local irritation  Recently he feels fairly good with complaints of decreased stamina or fatigue, no decreased libido  Testosterone level now is back to below 300 compared to 415  Previously hemoglobin was high compared to previous levels and now back down to normal levels  His labs show testosterone levels as below:  Lab Results  Component Value Date   TESTOSTERONE 294.91 (L) 07/25/2021    TESTOSTERONE 415.13 05/21/2021   TESTOSTERONE 219.43 (L) 11/15/2020   TESTOSTERONE 301.64 08/14/2020    Baseline free testosterone level was decreased at 19, normal  >35.8  Prolactin level: 15.4  Lab Results  Component Value Date   LH 6.04 03/20/2018   Lab Results  Component Value Date   HGB 16.5 07/25/2021    Diabetes screening: See review of systems      Allergies as of 12/06/2021       Reactions   Iodine Hives        Medication List        Accurate as of December 05, 2021  3:50 PM. If you have any questions, ask your nurse or doctor.          amLODipine 2.5 MG tablet Commonly known as: NORVASC TAKE 1 TABLET BY MOUTH EVERY DAY   MENS 50+ ADVANCED PO Take by mouth.   Natesto 5.5 MG/ACT Gel Generic drug: Testosterone Insert 1 dose in nostril 3 times a day   nitroGLYCERIN 0.1 mg/hr patch Commonly known as: NITRODUR - Dosed in mg/24 hr Cut patch into one - fourth pieces Place a one fourth piece of patch on  skin over affected area, changing to a new piece every 24 hours.   rosuvastatin 20 MG tablet Commonly known as: CRESTOR Take 1 tablet (20 mg total) by mouth daily.   telmisartan-hydrochlorothiazide 40-12.5 MG tablet Commonly known as: MICARDIS HCT TAKE 1 TABLET BY MOUTH EVERY DAY        Allergies:  Allergies  Allergen Reactions   Iodine Hives    Past Medical History:  Diagnosis Date   Allergy    Hyperlipidemia    Hypertension    Prediabetes    Sleep apnea    CPAP    Past Surgical History:  Procedure Laterality Date   APPENDECTOMY     TOOTH EXTRACTION     VASECTOMY      Family History  Problem Relation Age of Onset   Heart disease Mother    Kidney disease Mother    Heart disease Father        defibrillator/CHF.   Diabetes Father    Heart disease Sister        CHF   Diabetes Sister    Heart disease Sister        CHF    Social History:  reports that he has never smoked. He has never used smokeless tobacco. He reports  that he does not drink alcohol and does not use drugs.  Review of Systems  He has had prediabetes with persistently high fasting glucose, now 109  A1c was higher in 2020 at 6.3 and last is stable at 5.9   Has had nutritional counseling as of 10/21  He thinks he is cutting back on carbohydrates generally  Not exercising regularly because of plantar fasciitis   His maximum weight has been about 290 and weight on his last visit was 280   Family history of diabetes   Wt Readings from Last 3 Encounters:  08/29/21 277 lb (125.6 kg)  08/03/21 277 lb 9.6 oz (125.9 kg)  06/15/21 260 lb (117.9 kg)   Lab Results  Component Value Date   HGBA1C 5.9 05/21/2021   HGBA1C 6.0 11/15/2020   HGBA1C 6.0 06/29/2020   Lab Results  Component Value Date   LDLCALC 79 01/17/2020   CREATININE 1.31 07/25/2021   Hypertension: His blood pressure previously treated by PCP but on his last visit his blood pressure was significantly high and he is now taking Micardis HCT instead of HCT alone  Home BP 120/80-90  BP Readings from Last 3 Encounters:  08/29/21 (!) 175/95  08/03/21 130/80  06/15/21 (!) 140/92     General Examination:   There were no vitals taken for this visit.   Assessment/ Plan:  Hypogonadotropic Hypogonadism, 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 about 2012  He has had good control of his symptoms with testosterone supplementation He has had normal levels with 3 pumps of AndroGel with good energy level but he had a high hemoglobin of 17.9 With using Natesto instead of AndroGel he is subjectively still doing better  His testosterone level is just below 300  However his hemoglobin is back to normal   He is agreeable to trying the Natesto 3 times a day but have a lab for testosterone done in another month  Impaired fasting glucose: Stable with fasting glucose 109 Previously A1c in prediabetic range Does need to start an exercise program with  walking when he is able to Reminded him to continue watching carbohydrates and portions for weight loss  Hypertension: Currently managed with Micardis HCT and 2.5 amlodipine  Blood pressure is better controlled now and he will continue the same regimen  History of dyslipidemia: He has been getting Crestor from his PCP and since he does not have a new prescription will renew this for him  Given a list of PCPs to establish with    Reather Littler 12/05/2021, 3:50 PM

## 2021-12-06 ENCOUNTER — Ambulatory Visit: Payer: No Typology Code available for payment source | Admitting: Endocrinology

## 2022-02-13 ENCOUNTER — Other Ambulatory Visit: Payer: Self-pay | Admitting: Endocrinology

## 2022-05-15 ENCOUNTER — Other Ambulatory Visit: Payer: Self-pay | Admitting: Endocrinology

## 2022-08-09 ENCOUNTER — Other Ambulatory Visit: Payer: Self-pay | Admitting: Endocrinology

## 2022-08-11 ENCOUNTER — Other Ambulatory Visit: Payer: Self-pay | Admitting: Endocrinology

## 2022-08-14 ENCOUNTER — Other Ambulatory Visit: Payer: Self-pay | Admitting: Endocrinology

## 2022-10-24 ENCOUNTER — Encounter: Payer: Self-pay | Admitting: Internal Medicine

## 2022-10-24 ENCOUNTER — Ambulatory Visit: Payer: No Typology Code available for payment source | Admitting: Internal Medicine

## 2022-10-24 VITALS — BP 138/86 | HR 82 | Temp 97.8°F | Resp 16 | Ht 75.0 in | Wt 278.0 lb

## 2022-10-24 DIAGNOSIS — E785 Hyperlipidemia, unspecified: Secondary | ICD-10-CM

## 2022-10-24 DIAGNOSIS — Z0000 Encounter for general adult medical examination without abnormal findings: Secondary | ICD-10-CM

## 2022-10-24 DIAGNOSIS — R7989 Other specified abnormal findings of blood chemistry: Secondary | ICD-10-CM

## 2022-10-24 DIAGNOSIS — I10 Essential (primary) hypertension: Secondary | ICD-10-CM | POA: Diagnosis not present

## 2022-10-24 DIAGNOSIS — E230 Hypopituitarism: Secondary | ICD-10-CM

## 2022-10-24 DIAGNOSIS — Z6834 Body mass index (BMI) 34.0-34.9, adult: Secondary | ICD-10-CM | POA: Diagnosis not present

## 2022-10-24 DIAGNOSIS — E6609 Other obesity due to excess calories: Secondary | ICD-10-CM | POA: Diagnosis not present

## 2022-10-24 DIAGNOSIS — G4733 Obstructive sleep apnea (adult) (pediatric): Secondary | ICD-10-CM | POA: Insufficient documentation

## 2022-10-24 DIAGNOSIS — N401 Enlarged prostate with lower urinary tract symptoms: Secondary | ICD-10-CM

## 2022-10-24 DIAGNOSIS — Z0001 Encounter for general adult medical examination with abnormal findings: Secondary | ICD-10-CM

## 2022-10-24 DIAGNOSIS — K219 Gastro-esophageal reflux disease without esophagitis: Secondary | ICD-10-CM | POA: Diagnosis not present

## 2022-10-24 LAB — URINALYSIS, ROUTINE W REFLEX MICROSCOPIC
Bilirubin Urine: NEGATIVE
Hgb urine dipstick: NEGATIVE
Ketones, ur: NEGATIVE
Leukocytes,Ua: NEGATIVE
Nitrite: NEGATIVE
RBC / HPF: NONE SEEN (ref 0–?)
Specific Gravity, Urine: <=1.005 — AB (ref 1.000–1.030)
Total Protein, Urine: NEGATIVE
Urine Glucose: NEGATIVE
Urobilinogen, UA: 0.2 (ref 0.0–1.0)
WBC, UA: NONE SEEN (ref 0–?)
pH: 6 (ref 5.0–8.0)

## 2022-10-24 LAB — LIPID PANEL
Cholesterol: 274 mg/dL — ABNORMAL HIGH (ref 0–200)
HDL: 46.3 mg/dL (ref >39.00)
LDL Cholesterol: 197 mg/dL — ABNORMAL HIGH (ref 0–99)
NonHDL: 227.93
Total CHOL/HDL Ratio: 6
Triglycerides: 154 mg/dL — ABNORMAL HIGH (ref 0.0–149.0)
VLDL: 30.8 mg/dL (ref 0.0–40.0)

## 2022-10-24 LAB — HEPATIC FUNCTION PANEL
ALT: 60 U/L — ABNORMAL HIGH (ref 0–53)
AST: 31 U/L (ref 0–37)
Albumin: 4.8 g/dL (ref 3.5–5.2)
Alkaline Phosphatase: 46 U/L (ref 39–117)
Bilirubin, Direct: 0.1 mg/dL (ref 0.0–0.3)
Total Bilirubin: 0.6 mg/dL (ref 0.2–1.2)
Total Protein: 7.7 g/dL (ref 6.0–8.3)

## 2022-10-24 LAB — BASIC METABOLIC PANEL
BUN: 13 mg/dL (ref 6–23)
CO2: 30 mEq/L (ref 19–32)
Calcium: 10 mg/dL (ref 8.4–10.5)
Chloride: 100 mEq/L (ref 96–112)
Creatinine, Ser: 1.22 mg/dL (ref 0.40–1.50)
GFR: 62.76 mL/min (ref >60.00)
Glucose, Bld: 88 mg/dL (ref 70–99)
Potassium: 4.1 mEq/L (ref 3.5–5.1)
Sodium: 139 mEq/L (ref 135–145)

## 2022-10-24 LAB — CBC WITH DIFFERENTIAL/PLATELET
Basophils Absolute: 0 10*3/uL (ref 0.0–0.1)
Basophils Relative: 0.4 % (ref 0.0–3.0)
Eosinophils Absolute: 0.1 10*3/uL (ref 0.0–0.7)
Eosinophils Relative: 1.5 % (ref 0.0–5.0)
HCT: 47.2 % (ref 39.0–52.0)
Hemoglobin: 16.1 g/dL (ref 13.0–17.0)
Lymphocytes Relative: 33.2 % (ref 12.0–46.0)
Lymphs Abs: 2.2 10*3/uL (ref 0.7–4.0)
MCHC: 34 g/dL (ref 30.0–36.0)
MCV: 86.7 fl (ref 78.0–100.0)
Monocytes Absolute: 0.4 10*3/uL (ref 0.1–1.0)
Monocytes Relative: 6.1 % (ref 3.0–12.0)
Neutro Abs: 3.8 10*3/uL (ref 1.4–7.7)
Neutrophils Relative %: 58.8 % (ref 43.0–77.0)
Platelets: 279 10*3/uL (ref 150.0–400.0)
RBC: 5.45 Mil/uL (ref 4.22–5.81)
RDW: 14.5 % (ref 11.5–15.5)
WBC: 6.5 10*3/uL (ref 4.0–10.5)

## 2022-10-24 LAB — PSA: PSA: 3.57 ng/mL (ref 0.10–4.00)

## 2022-10-24 LAB — HEMOGLOBIN A1C: Hgb A1c MFr Bld: 6.1 % (ref 4.6–6.5)

## 2022-10-24 LAB — TSH: TSH: 2.03 u[IU]/mL (ref 0.35–5.50)

## 2022-10-24 NOTE — Progress Notes (Unsigned)
Subjective:  Patient ID: Dustin Mckee, male    DOB: 11/02/1956  Age: 66 y.o. MRN: [id number]  CC: Annual Exam and Hypertension   HPI JEVAUGHN DEGOLLADO presents for a CPX and to establish -  He is very active and denies chest pain, shortness of breath, diaphoresis, or edema.  He tells me his blood pressure has been well-controlled.  Outpatient Medications Prior to Visit  Medication Sig Dispense Refill   Multiple Vitamins-Minerals (MENS 50+ ADVANCED PO) Take by mouth.     telmisartan-hydrochlorothiazide (MICARDIS HCT) 40-12.5 MG tablet TAKE 1 TABLET BY MOUTH EVERY DAY 90 tablet 2   amLODipine (NORVASC) 2.5 MG tablet TAKE 1 TABLET BY MOUTH EVERY DAY 30 tablet 0   nitroGLYCERIN (NITRODUR - DOSED IN MG/24 HR) 0.1 mg/hr patch Cut patch into one - fourth pieces Place a one fourth piece of patch on  skin over affected area, changing to a new piece every 24 hours. 15 patch 2   rosuvastatin (CRESTOR) 20 MG tablet Take 1 tablet (20 mg total) by mouth daily. 90 tablet 1   Testosterone (NATESTO) 5.5 MG/ACT GEL Insert 1 dose in nostril 3 times a day 21.96 g 2   No facility-administered medications prior to visit.    ROS Review of Systems  Constitutional: Negative.  Negative for chills, diaphoresis, fatigue, fever and unexpected weight change.  HENT: Negative.    Eyes: Negative.   Respiratory:  Positive for apnea. Negative for cough, chest tightness, shortness of breath and wheezing.   Cardiovascular:  Negative for chest pain, palpitations and leg swelling.  Gastrointestinal:  Negative for abdominal pain, diarrhea, nausea and vomiting.  Endocrine: Negative.  Negative for polyuria.  Genitourinary: Negative.  Negative for difficulty urinating, dysuria, enuresis, hematuria and urgency.  Musculoskeletal: Negative.  Negative for arthralgias and myalgias.  Skin: Negative.   Neurological: Negative.  Negative for dizziness, weakness and light-headedness.  Hematological:  Negative for adenopathy. Does  not bruise/bleed easily.  Psychiatric/Behavioral: Negative.      Objective:  BP 138/86 (BP Location: Right Arm, Patient Position: Sitting, Cuff Size: Large)   Pulse 82   Temp 97.8 F (36.6 C) (Oral)   Resp 16   Ht 6\' 3"  (1.905 m)   Wt 278 lb (126.1 kg)   SpO2 94%   BMI 34.75 kg/m   BP Readings from Last 3 Encounters:  10/24/22 138/86  08/29/21 (!) 175/95  08/03/21 130/80    Wt Readings from Last 3 Encounters:  10/24/22 278 lb (126.1 kg)  08/29/21 277 lb (125.6 kg)  08/03/21 277 lb 9.6 oz (125.9 kg)    Physical Exam Vitals reviewed.  Constitutional:      Appearance: Normal appearance.  HENT:     Nose: Nose normal.     Mouth/Throat:     Mouth: Mucous membranes are moist.  Eyes:     General: No scleral icterus.    Conjunctiva/sclera: Conjunctivae normal.  Cardiovascular:     Rate and Rhythm: Normal rate and regular rhythm.     Pulses: Normal pulses.     Heart sounds: S1 normal and S2 normal. No murmur heard.    No friction rub. No gallop.     Comments: EKG- NSR, 77 bpm No LVH or Q waves Normal EKG Pulmonary:     Effort: Pulmonary effort is normal.     Breath sounds: No stridor. No wheezing, rhonchi or rales.  Abdominal:     General: Abdomen is flat.     Palpations: There is no  mass.     Tenderness: There is no abdominal tenderness. There is no guarding.     Hernia: No hernia is present.  Genitourinary:    Penis: Normal and uncircumcised.      Prostate: Enlarged. Not tender and no nodules present.     Rectum: Normal. Guaiac result negative. No mass, tenderness, anal fissure, external hemorrhoid or internal hemorrhoid. Normal anal tone.  Musculoskeletal:        General: Normal range of motion.     Cervical back: Neck supple.     Right lower leg: No edema.     Left lower leg: No edema.  Lymphadenopathy:     Cervical: No cervical adenopathy.  Skin:    General: Skin is warm and dry.  Neurological:     General: No focal deficit present.     Mental Status:  He is alert.  Psychiatric:        Mood and Affect: Mood normal.        Behavior: Behavior normal.     Lab Results  Component Value Date   WBC 6.5 10/24/2022   HGB 16.1 10/24/2022   HCT 47.2 10/24/2022   PLT 279.0 10/24/2022   GLUCOSE 88 10/24/2022   CHOL 274 (H) 10/24/2022   TRIG 154.0 (H) 10/24/2022   HDL 46.30 10/24/2022   LDLCALC 197 (H) 10/24/2022   ALT 60 (H) 10/24/2022   AST 31 10/24/2022   NA 139 10/24/2022   K 4.1 10/24/2022   CL 100 10/24/2022   CREATININE 1.22 10/24/2022   BUN 13 10/24/2022   CO2 30 10/24/2022   TSH 2.03 10/24/2022   PSA 3.57 10/24/2022   HGBA1C 6.1 10/24/2022    No results found.  Assessment & Plan:   Junior was seen today for annual exam and hypertension.  Diagnoses and all orders for this visit:  Gastroesophageal reflux disease, unspecified whether esophagitis present  Primary hypertension- EKG is negative for LVH. His blood pressure is adequately well-controlled.  Will continue the combination of an ARB and thiazide diuretic. -     TSH; Future -     Urinalysis, Routine w reflex microscopic; Future -     CBC with Differential/Platelet; Future -     Basic metabolic panel; Future -     EKG 12-Lead -     Basic metabolic panel -     CBC with Differential/Platelet -     Urinalysis, Routine w reflex microscopic -     TSH  Encounter for general adult medical examination with abnormal findings- Exam completed, labs reviewed, vaccines reviewed-he deferred on the shingles vaccine, cancer screenings are up-to-date, patient education was given. -     Lipid panel; Future -     PSA; Future -     PSA -     Lipid panel  OSA on CPAP -     Ambulatory referral to Sleep Studies  Class 1 obesity due to excess calories with serious comorbidity and body mass index (BMI) of 34.0 to 34.9 in adult- He is prediabetic but labs are negative for secondary causes. -     TSH; Future -     Hepatic function panel; Future -     Hemoglobin A1c; Future -      Hemoglobin A1c -     Hepatic function panel -     TSH  Hypogonadotropic hypogonadism (HCC) -     CBC -     Testosterone  Elevated LFTs- Will evaluate for NASH. -  US Abdomen Limited RUQ (LIVER/GB); Future  Dyslipidemia, goal LDL below 130- His ASCVD risk score is above 15% so I recommended that he start taking a statin. -     rosuvastatin (CRESTOR) 20 MG tablet; Take 1 tablet (20 mg total) by mouth daily.   I have discontinued Earl Lites A. Donna "Greg"'s nitroGLYCERIN, amLODipine, Natesto, and rosuvastatin. I am also having him start on rosuvastatin. Additionally, I am having him maintain his Multiple Vitamins-Minerals (MENS 50+ ADVANCED PO) and telmisartan-hydrochlorothiazide.  Meds ordered this encounter  Medications   rosuvastatin (CRESTOR) 20 MG tablet    Sig: Take 1 tablet (20 mg total) by mouth daily.    Dispense:  90 tablet    Refill:  1     Follow-up: Return in about 6 months (around 04/25/2023).  Sanda Linger, MD

## 2022-10-25 DIAGNOSIS — R7989 Other specified abnormal findings of blood chemistry: Secondary | ICD-10-CM | POA: Insufficient documentation

## 2022-10-25 MED ORDER — ROSUVASTATIN CALCIUM 20 MG PO TABS: 20.0000 mg | ORAL_TABLET | Freq: Every day | ORAL | 1 refills | Status: DC

## 2022-10-26 ENCOUNTER — Encounter: Payer: Self-pay | Admitting: Internal Medicine

## 2022-11-08 ENCOUNTER — Ambulatory Visit
Admission: RE | Admit: 2022-11-08 | Discharge: 2022-11-08 | Disposition: A | Discharge status: Home / Self Care | Payer: No Typology Code available for payment source | Source: Ambulatory Visit | Attending: Internal Medicine | Admitting: Internal Medicine

## 2022-11-08 DIAGNOSIS — R7989 Other specified abnormal findings of blood chemistry: Secondary | ICD-10-CM

## 2023-01-08 ENCOUNTER — Other Ambulatory Visit (INDEPENDENT_AMBULATORY_CARE_PROVIDER_SITE_OTHER): Payer: No Typology Code available for payment source

## 2023-01-08 ENCOUNTER — Other Ambulatory Visit: Payer: Self-pay | Admitting: Endocrinology

## 2023-01-08 DIAGNOSIS — E230 Hypopituitarism: Secondary | ICD-10-CM

## 2023-01-08 DIAGNOSIS — R7301 Impaired fasting glucose: Secondary | ICD-10-CM

## 2023-01-08 LAB — TESTOSTERONE: Testosterone: 163.88 ng/dL — ABNORMAL LOW (ref 300.00–890.00)

## 2023-01-08 LAB — GLUCOSE, RANDOM: Glucose, Bld: 107 mg/dL — ABNORMAL HIGH (ref 70–99)

## 2023-01-13 ENCOUNTER — Other Ambulatory Visit: Payer: No Typology Code available for payment source

## 2023-01-14 ENCOUNTER — Ambulatory Visit: Payer: No Typology Code available for payment source | Admitting: Endocrinology

## 2023-01-14 ENCOUNTER — Encounter: Payer: Self-pay | Admitting: Endocrinology

## 2023-01-14 VITALS — BP 136/80 | HR 80 | Ht 75.0 in | Wt 284.0 lb

## 2023-01-14 DIAGNOSIS — Z6835 Body mass index (BMI) 35.0-35.9, adult: Secondary | ICD-10-CM

## 2023-01-14 DIAGNOSIS — R7301 Impaired fasting glucose: Secondary | ICD-10-CM

## 2023-01-14 DIAGNOSIS — E230 Hypopituitarism: Secondary | ICD-10-CM

## 2023-01-14 MED ORDER — QSYMIA 7.5-46 MG PO CP24: ORAL_CAPSULE | ORAL | 1 refills | Status: DC

## 2023-01-14 MED ORDER — NATESTO 5.5 MG/ACT NA GEL: NASAL | 2 refills | Status: DC

## 2023-01-14 MED ORDER — QSYMIA 3.75-23 MG PO CP24: ORAL_CAPSULE | ORAL | 0 refills | Status: DC

## 2023-01-14 NOTE — Progress Notes (Signed)
Patient ID: Dustin Mckee, male   DOB: 1956-02-16, 67 y.o.   MRN: [id number]            Chief complaint: Endocrinology follow-up  History of Present Illness  Hypogonadism: Diagnosed in 2012 by his previous primary care physician when he was having complaints of fatigue and lethargy He does not know the details about his evaluation and no records are available However he was treated with a testosterone gel for several months with improvement in his symptoms He then stopped treatment on his own  He had had complaints of fatigue, some lethargy, decreased motivation, decreased libido for a few months prior to his initial consultation He had testosterone levels levels that were significantly low  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, prolactin upper normal He had been on CLOMIPHENE half tablet 3 times a week since early April 2017  RECENT history:  He has not been seen in follow-up since 07/2021  Testosterone level has been as low as 127 in the past without treatment  Previously with taking clomiphene 3 times a week follow-up testosterone level was up to 326 Because of his complaints of  having some decreased energy level and libido, lower motivation level he was switched from clomiphene to testosterone gel 2 pumps daily with improvement in symptoms  Testosterone levels were normal subsequently with AndroGel 1.62% 3 pumps daily This was changed to [facility] because of high hemoglobin of 17.9  However he stopped this about a year and a half ago because he had a respiratory infection and was not taking it with his nasal congestion, also did not resume this subsequently or come back for follow-up  Recently he feels lethargic and tired although this is not consistent, also has some decreased libido  As expected his testosterone level is low at 164  His labs show testosterone levels as below:  Lab Results   Component Value Date   TESTOSTERONE 163.88 (L) 01/08/2023   TESTOSTERONE 294.91 (L) 07/25/2021   TESTOSTERONE 415.13 05/21/2021   TESTOSTERONE 219.43 (L) 11/15/2020    Baseline free testosterone level was decreased at 19, normal  >35.8  Prolactin level: 15.4  Lab Results  Component Value Date   LH 6.04 03/20/2018   Lab Results  Component Value Date   HGB 16.1 10/24/2022    Diabetes screening: See review of systems      Allergies as of 01/14/2023       Reactions   Iodine Hives        Medication List        Accurate as of January 14, 2023  1:23 PM. If you have any questions, ask your nurse or doctor.          MENS 50+ ADVANCED PO Take by mouth.   rosuvastatin 20 MG tablet Commonly known as: CRESTOR Take 1 tablet (20 mg total) by mouth daily.   telmisartan-hydrochlorothiazide 40-12.5 MG tablet Commonly known as: MICARDIS HCT TAKE 1 TABLET BY MOUTH EVERY DAY        Allergies:  Allergies  Allergen Reactions   Iodine Hives    Past Medical History:  Diagnosis Date   Allergy    Hyperlipidemia    Hypertension    Prediabetes    Sleep apnea    CPAP    Past Surgical History:  Procedure Laterality Date   APPENDECTOMY     TOOTH EXTRACTION     VASECTOMY      Family  History  Problem Relation Age of Onset   Heart disease Mother    Kidney disease Mother    Heart disease Father        defibrillator/CHF.   Diabetes Father    Heart disease Sister        CHF   Diabetes Sister    Heart disease Sister        CHF    Social History:  reports that he has never smoked. He has never used smokeless tobacco. He reports that he does not drink alcohol and does not use drugs.  Review of Systems  He has had prediabetes with persistently high fasting glucose, now 107  A1c was however still 2020 at 6.3  When checked by PCP it was 6.1 in December   Has had nutritional counseling as of 10/21  Because of his fatigue he has not done any exercise  His  maximum weight has been about 290 and weight has been trending higher He says he wants some medication to help him start his weight loss  Family history of diabetes   Wt Readings from Last 3 Encounters:  01/14/23 284 lb (128.8 kg)  10/24/22 278 lb (126.1 kg)  08/29/21 277 lb (125.6 kg)   Lab Results  Component Value Date   HGBA1C 6.1 10/24/2022   HGBA1C 5.9 05/21/2021   HGBA1C 6.0 11/15/2020   Lab Results  Component Value Date   LDLCALC 197 (H) 10/24/2022   CREATININE 1.22 10/24/2022   Hypertension: His blood pressure treated with Micardis HCT He does also check at home periodically  BP Readings from Last 3 Encounters:  01/14/23 136/80  10/24/22 138/86  08/29/21 (!) 175/95     General Examination:   BP 136/80 (BP Location: Left Arm, Patient Position: Sitting, Cuff Size: Large)   Pulse 80   Ht '6\' 3"'$  (1.905 m)   Wt 284 lb (128.8 kg)   SpO2 96%   BMI 35.50 kg/m    Assessment/ Plan:  Hypogonadotropic Hypogonadism, 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 about 2012  Previously had good control of his symptoms with testosterone supplementation and most recently was taking Natesto Has not taken any treatment for about a year and a half and is having symptoms of fatigue on most days along with decreased libido He is interested in going back on treatment as his level is below 200 Discussed that he should try to use Natesto instead of AndroGel because of previous tendency to increasing hemoglobin with testosterone gels  He is agreeable to trying the Natesto at least 2 times a day and can take it when he has less symptoms of nasal congestion during the day New prescription sent He will also need to follow-up in about 2 months  Impaired fasting glucose: About the same with fasting glucose 107 and A1c last 6.1 However with his BMI over 35 he needs significant weight loss to help him with features of metabolic syndrome including hypertension,  hypogonadism and dyslipidemia  Since GLP-1 drugs are not covered by his insurance he will start Qsymia 3.75 mg for 2 weeks and then 7.5 mg daily Given written prescription and he will look up further information on the website Discussed possible side effect  Does need to start an exercise program once he has better energy level Needs to be consistently watching carbohydrates and portions for weight loss  Hypertension: Currently managed with Micardis HCT and blood pressure is fairly well-controlled To monitor periodically at home also  Elayne Snare 01/14/2023, 1:23 PM

## 2023-01-16 ENCOUNTER — Ambulatory Visit: Payer: No Typology Code available for payment source | Admitting: Endocrinology

## 2023-02-21 ENCOUNTER — Telehealth: Payer: Self-pay

## 2023-02-21 NOTE — Telephone Encounter (Signed)
Natesto needs a PA.  Patient has been waiting since 01/14/23 to get this approved.

## 2023-02-24 ENCOUNTER — Other Ambulatory Visit (HOSPITAL_COMMUNITY): Payer: Self-pay

## 2023-02-24 ENCOUNTER — Telehealth: Payer: Self-pay

## 2023-02-24 NOTE — Telephone Encounter (Signed)
Patient Advocate Encounter   Received notification from pt msgs that prior authorization is required for Natesto 5.5MG /ACT gel  Submitted: 02/24/23 Key [id number]  Sent for expedited review Status is APPROVED  PA # [id number] Effective dates: 02/24/23 through 02/24/24

## 2023-03-06 ENCOUNTER — Encounter: Payer: Self-pay | Admitting: Neurology

## 2023-03-06 ENCOUNTER — Ambulatory Visit (INDEPENDENT_AMBULATORY_CARE_PROVIDER_SITE_OTHER): Payer: No Typology Code available for payment source | Admitting: Neurology

## 2023-03-06 VITALS — BP 144/80 | HR 80 | Ht 75.0 in | Wt 271.0 lb

## 2023-03-06 DIAGNOSIS — Z6833 Body mass index (BMI) 33.0-33.9, adult: Secondary | ICD-10-CM | POA: Diagnosis not present

## 2023-03-06 DIAGNOSIS — E6609 Other obesity due to excess calories: Secondary | ICD-10-CM

## 2023-03-06 DIAGNOSIS — G4733 Obstructive sleep apnea (adult) (pediatric): Secondary | ICD-10-CM | POA: Diagnosis not present

## 2023-03-06 NOTE — Patient Instructions (Addendum)
67 y.o. year old male  here with:     1) non restorative sleep, higher degree of daytime sleepiness while highly compliant on CPAP. Feeling better when using his travel CPAP, both machines were set at 7 cm water.    He has been tested for testosterone being low, TSH is normal ( and has been for 9 years looking back) , Vit D was low, B12 was normal.    I like to repeat a sleep test, this time at HOME- HST.  This will help me to see if the apnea  is accompanied by hypoxia, if there are centrals or not.  The new machine is to be set for an auto machine -  5- 12 cm water 2 cm EPR,          I plan to follow up either personally or through our NP within 3-4 months.     Screening for Sleep Apnea  Sleep apnea is a condition in which breathing pauses or becomes shallow during sleep. Sleep apnea screening is a test to determine if you are at risk for sleep apnea. The test includes a series of questions. It will only takes a few minutes. Your health care provider may ask you to have this test in preparation for surgery or as part of a physical exam. What are the symptoms of sleep apnea? Common symptoms of sleep apnea include: Snoring. Waking up often at night. Daytime sleepiness. Pauses in breathing. Choking or gasping during sleep. Irritability. Forgetfulness. Trouble thinking clearly. Depression. Personality changes. Most people with sleep apnea do not know that they have it. What are the advantages of sleep apnea screening? Getting screened for sleep apnea can help: Ensure your safety. It is important for your health care providers to know whether or not you have sleep apnea, especially if you are having surgery or have other long-term (chronic) health conditions. Improve your health and allow you to get a better night's rest. Restful sleep can help you: Have more energy. Lose weight. Improve high blood pressure. Improve diabetes management. Prevent stroke. Prevent car  accidents. What happens during the screening? Screening usually includes being asked a list of questions about your sleep quality. Some questions you may be asked include: Do you snore? Is your sleep restless? Do you have daytime sleepiness? Has a partner or spouse told you that you stop breathing during sleep? Have you had trouble concentrating or memory loss? What is your age? What is your neck circumference? To measure your neck, keep your back straight and gently wrap the tape measure around your neck. Put the tape measure at the middle of your neck, between your chin and collarbone. What is your sex assigned at birth? Do you have or are you being treated for high blood pressure? If your screening test is positive, you are at risk for the condition. Further testing may be needed to confirm a diagnosis of sleep apnea. Where to find more information You can find screening tools online or at your health care clinic. For more information about sleep apnea screening and healthy sleep, visit these websites: Centers for Disease Control and Prevention: FootballExhibition.com.br American Sleep Apnea Association: www.sleepapnea.org Contact a health care provider if: You think that you may have sleep apnea. Summary Sleep apnea screening can help determine if you are at risk for sleep apnea. It is important for your health care providers to know whether or not you have sleep apnea, especially if you are having surgery or have other chronic  health conditions. You may be asked to take a screening test for sleep apnea in preparation for surgery or as part of a physical exam. This information is not intended to replace advice given to you by your health care provider. Make sure you discuss any questions you have with your health care provider. Document Revised: 10/06/2020 Document Reviewed: 10/06/2020 Elsevier Patient Education  2023 [employer].

## 2023-03-06 NOTE — Progress Notes (Signed)
SLEEP MEDICINE CLINIC    Provider:  Melvyn Novas, MD  Primary Care Physician:  Etta Grandchild, MD [address] [place] [address]     Referring Provider: Etta Grandchild, Md [address] [place],  [place] [address]          Chief Complaint according to patient   Patient presents with:     New Patient (Initial Visit)           HISTORY OF PRESENT ILLNESS:  Dustin Mckee is a 67 y.o. male patient who is seen upon referral on 03/06/2023 from Dr Yetta Barre. for a new evaluation of sleep apnea. .  Chief concern according to patient :  " I don't feel as refreshed after sleeping with CPAP than I used to, I also sleep better with my travel CPAP , but I don't know how it was st". ResMed machines, both. Nasal pillows.    I have the pleasure of seeing [name] again , on  03/06/23 , he is a long term CPAP patient. His wife works for [employer], as a Child psychotherapist.       Chief complaint according to patient :" I don't have the same benefit by using CPAP".  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   Dustin Mckee reports that in the year 2004 he was diagnosed with sleep apnea at [facility]  center and a CPAP machine was prescribed. About 6 years later around 2010 there was an adjustment made to the pressures. Since then and in between he did not require any changes and he has used his CPAP faithfully ever since. Since 2010 he has gained weight and that may be a factor in his resurgence of sleepiness. He feels more fatigued unable to multitask or focus as well. A download was obtained today in office showing that his CPAP has been used on average 6 hours and 47 minutes per day and that he has is to 30 out of 30 days. His compliance is 96% which is excellent. Unfortunately he was prescribed an escape model which does not allow access to therapeutic data such  as residual AHI. He is using and nasal pillow model as interface. He was retested in 10-14-2017 and diagnosed again, given his most current CPAP machine at the time.  He uses a setting of 7 cm water pressure an nasal pillows, and he prefers his travel CPAP, which is less than one year old, 8 months. [facility] [facility] issued the machines.    Sleep habits are as follows: Patient works daytime job, his usual bedtime is between 10 and 11:00 PM, he falls asleep promptly with his CPAP. He will go to the bathroom perhaps once.  He has noted that he doesn't seem to dream as much anymore.  He sleeps otherwise well through the night , on his left side, until he rises in the morning at 6 AM. He wakes up spontaneously without alarm. The bedroom is cool, quiet and dark. He shares the bedroom with his spouse. He will take a couple of coffee in the morning and commute to work. His workplace does not have access to natural daylight. He has found himself during the day sometimes for a couple of seconds to microsleep. He does not schedule any naps. After work around 6 PM he often feels that he is tired enough  to take a nap but this is rare. He reports not feeling refreshed or restored in AM, with symptoms such as dry mouth, morning headaches, and residual fatigue.  Naps are taken rarely, lasting from 15 to 20 minutes and are more refreshing , but he lacks opportunity to nap.    Sleep medical history and family sleep history:  Dustin Mckee is a 67 y.o. male , seen here as a referral from Dr. Nichola Sizer, MD  / Deliah Boston, PA at [facility] for a sleep consultation.The patient has 3 brothers and his youngest brother was diagnosed with sleep apnea. Both parents were snorers.  The patient slept walked in childhood after elementary school-age this ceased. Oldest son is a sleep walker.    Social history:  Married, 3 children , no ETOH, non smoker. Coffee 1 cup per day.  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  and lives in a household with spouse, daughter is graduating in May from college,  she will be a Child psychotherapist.     I have here the compliance to her from March through April 2024 for [name] and has used his CPAP 28 out of 30 days all of these days over 6 hours with an average of 6 hours and 46 minutes.  Pressure is set at 7 cm water with 3 cm EPR is AHI is 1.8 which speaks for good resolution.  His 95th percentile air leak is only 3.7 L of very low flow.  There are no significant central apneas arising.  He is using nasal pillows so he may stop sometimes to snore even with CPAP use.  From these data I cannot gather why he would be more fatigued or why his sleep would be more restorative.     Review of Systems: Out of a complete 14 system review, the patient complains of only the following symptoms, and all other reviewed systems are negative.:  Fatigue, sleepiness , snoring without CPAP   How likely are you to doze in the following situations: 0 = not likely, 1 = slight chance, 2 = moderate chance, 3 = high chance   Sitting and Reading? Watching Television? Sitting inactive in a public place (theater or meeting)? As a passenger in a car for an hour without a break? Lying down in the afternoon when circumstances permit? Sitting and talking to someone? Sitting quietly after lunch without alcohol? In a car, while stopped for a few minutes in traffic?   Total = 4/ 24 points  on CPAP   FSS endorsed at 48/ 63 points.   Depression score 4/ 15 points - negative,   Social History   Socioeconomic History   Marital status: Married    Spouse name: [name]   Number of children: Not on file   Years of education: Not on file   Highest education level: Not on file  Occupational History   Not on file  Tobacco Use   Smoking status: Never    Smokeless tobacco: Never  Substance and Sexual Activity   Alcohol use: No   Drug use: No   Sexual activity: Yes    Partners: Female    Birth control/protection: None  Other Topics Concern   Not on file  Social History Narrative   Drinks 1-2 cups caffeine daily.   Social Determinants of Health  Financial Resource Strain: Not on file  Food Insecurity: Not on file  Transportation Needs: Not on file  Physical Activity: Not on file  Stress: Not on file  Social Connections: Not on file    Family History  Problem Relation Age of Onset   Heart disease Mother    Kidney disease Mother    Heart disease Father        defibrillator/CHF.   Diabetes Father    Heart disease Sister        CHF   Diabetes Sister    Heart disease Sister        CHF    Past Medical History:  Diagnosis Date   Allergy    Hyperlipidemia    Hypertension    Prediabetes    Sleep apnea    CPAP    Past Surgical History:  Procedure Laterality Date   APPENDECTOMY     TOOTH EXTRACTION     VASECTOMY       Current Outpatient Medications on File Prior to Visit  Medication Sig Dispense Refill   Multiple Vitamins-Minerals (MENS 50+ ADVANCED PO) Take by mouth.     Phentermine-Topiramate (QSYMIA) 7.5-46 MG CP24 1 daily 30 capsule 1   rosuvastatin (CRESTOR) 20 MG tablet Take 1 tablet (20 mg total) by mouth daily. 90 tablet 1   telmisartan-hydrochlorothiazide (MICARDIS HCT) 40-12.5 MG tablet TAKE 1 TABLET BY MOUTH EVERY DAY 90 tablet 2   Testosterone (NATESTO) 5.5 MG/ACT GEL Apply 1 squirt of pump in each nostril 3 times a day 21.96 g 2   No current facility-administered medications on file prior to visit.    Allergies  Allergen Reactions   Iodine Hives     DIAGNOSTIC DATA (LABS, IMAGING, TESTING) - I reviewed patient records, labs, notes, testing and imaging myself where available.  Lab Results  Component Value Date   WBC 6.5 10/24/2022   HGB 16.1 10/24/2022   HCT 47.2 10/24/2022   MCV 86.7  10/24/2022   PLT 279.0 10/24/2022      Component Value Date/Time   NA 139 10/24/2022 1419   NA 140 01/17/2020 1450   K 4.1 10/24/2022 1419   CL 100 10/24/2022 1419   CO2 30 10/24/2022 1419   GLUCOSE 107 (H) 01/08/2023 0914   BUN 13 10/24/2022 1419   BUN 14 01/17/2020 1450   CREATININE 1.22 10/24/2022 1419   CREATININE 1.14 04/17/2016 0950   CALCIUM 10.0 10/24/2022 1419   PROT 7.7 10/24/2022 1419   PROT 7.1 01/17/2020 1450   ALBUMIN 4.8 10/24/2022 1419   ALBUMIN 4.7 01/17/2020 1450   AST 31 10/24/2022 1419   ALT 60 (H) 10/24/2022 1419   ALKPHOS 46 10/24/2022 1419   BILITOT 0.6 10/24/2022 1419   BILITOT 0.5 01/17/2020 1450   GFRNONAA 65 01/17/2020 1450   GFRNONAA 71 08/23/2015 1501   GFRAA 75 01/17/2020 1450   GFRAA 82 08/23/2015 1501   Lab Results  Component Value Date   CHOL 274 (H) 10/24/2022   HDL 46.30 10/24/2022   LDLCALC 197 (H) 10/24/2022   TRIG 154.0 (H) 10/24/2022   CHOLHDL 6 10/24/2022   Lab Results  Component Value Date   HGBA1C 6.1 10/24/2022   Lab Results  Component Value Date   VITAMINB12 699 07/28/2013   Lab Results  Component Value Date   TSH 2.03 10/24/2022    PHYSICAL EXAM:  Today's Vitals   03/06/23 0957  BP: (!) 144/80  Pulse: 80  Weight: 271 lb (122.9 kg)  Height:  6\' 3"  (1.905 m)   Body mass index is 33.87 kg/m.   Wt Readings from Last 3 Encounters:  03/06/23 271 lb (122.9 kg)  01/14/23 284 lb (128.8 kg)  10/24/22 278 lb (126.1 kg)     Ht Readings from Last 3 Encounters:  03/06/23 6\' 3"  (1.905 m)  01/14/23 6\' 3"  (1.905 m)  10/24/22 6\' 3"  (1.905 m)      General: The patient is awake, alert and appears not in acute distress. The patient is well groomed. Head: Normocephalic, atraumatic. Neck is supple. Mallampati 3,  neck circumference:18 inches .  Nasal airflow is patent.  Retrognathia is not seen.  Dental status: biologic Cardiovascular:  Regular rate and cardiac rhythm by pulse,  without distended neck  veins. Respiratory: Lungs are clear to auscultation.  Skin:  Without evidence of ankle edema, or rash. Trunk: The patient's posture is erect.   NEUROLOGIC EXAM: The patient is awake and alert, oriented to place and time.   Memory subjective described as intact.  Attention span & concentration ability appears normal.  Speech is fluent,  without  dysarthria, dysphonia or aphasia.  Mood and affect are appropriate.   Cranial nerves: no loss of smell or taste reported  Pupils are equal and briskly reactive to light. Funduscopic exam deferred.  Extraocular movements in vertical and horizontal planes were intact and without nystagmus. No Diplopia. Visual fields by finger perimetry are intact. Hearing was intact to soft voice and finger rubbing. Facial sensation intact to fine touch. Facial motor strength is symmetric and tongue is in midline.  Neck ROM : rotation, tilt and flexion extension were normal for age and shoulder shrug was symmetrical.    Motor exam:  Symmetric bulk, tone and ROM.   Normal tone without cog wheeling, symmetric grip strength .   Sensory:  Fine touch, pinprick and vibration were tested  and  normal.  Proprioception tested in the upper extremities was normal.   Coordination: Rapid alternating movements in the fingers/hands were of normal speed.  The Finger-to-nose maneuver was intact without evidence of ataxia, dysmetria or tremor.   Gait and station: Patient could rise unassisted from a seated position, walked without assistive device.  Deep tendon reflexes: in the  upper and lower extremities are symmetric and intact.  Babinski response was deferred.    ASSESSMENT AND PLAN 67 y.o. year old male  here with:    1) non restorative sleep, higher degree of daytime sleepiness while highly compliant on CPAP. Feeling better when using his travel CPAP, both machines were set at 7 cm water.   He has been tested for testosterone being low, TSH is normal ( and has been for  9 years looking back) , Vit D was low, B12 was normal.   I like to repeat a sleep test, this time at HOME- HST.  This will help me to see if the apnea  is accompanied by hypoxia, if there are centrals or not.  The new machine is to be set for an auto machine -  5- 12 cm water 2 cm EPR,      I plan to follow up either personally or through our NP within 3-4 months.   I would like to thank Etta Grandchild, MD and Etta Grandchild, Md [address] [place],  [place] [address] for allowing me to meet with and to take care of this pleasant patient.   After spending a total time of  45  minutes face to  face and additional time for physical and neurologic examination, review of laboratory studies,  personal review of imaging studies, reports and results of other testing and review of referral information / records as far as provided in visit,   Electronically signed by: Melvyn Novas, MD 03/06/2023 10:31 AM  [facility] Sleep Board certified by The [employer] of Sleep Medicine and Diplomate of the [employer] of Sleep Medicine. Board certified In Neurology through the ABPN, Fellow of the [employer] of Neurology. Medical Director of [employer].

## 2023-03-19 ENCOUNTER — Ambulatory Visit: Payer: No Typology Code available for payment source | Admitting: Endocrinology

## 2023-03-19 ENCOUNTER — Other Ambulatory Visit: Payer: No Typology Code available for payment source

## 2023-03-26 ENCOUNTER — Telehealth: Payer: Self-pay | Admitting: Neurology

## 2023-03-26 NOTE — Telephone Encounter (Signed)
MAILOUT- aetna no auth req   On the schedule for 04/08/23.

## 2023-04-04 ENCOUNTER — Other Ambulatory Visit: Payer: Self-pay | Admitting: Endocrinology

## 2023-04-08 ENCOUNTER — Ambulatory Visit: Payer: No Typology Code available for payment source | Admitting: Neurology

## 2023-04-08 DIAGNOSIS — G4733 Obstructive sleep apnea (adult) (pediatric): Secondary | ICD-10-CM | POA: Diagnosis not present

## 2023-04-08 DIAGNOSIS — G4719 Other hypersomnia: Secondary | ICD-10-CM

## 2023-04-08 DIAGNOSIS — E6609 Other obesity due to excess calories: Secondary | ICD-10-CM

## 2023-04-19 ENCOUNTER — Other Ambulatory Visit: Payer: Self-pay | Admitting: Internal Medicine

## 2023-04-19 DIAGNOSIS — E785 Hyperlipidemia, unspecified: Secondary | ICD-10-CM

## 2023-04-21 NOTE — Progress Notes (Signed)
[facility]   HOME SLEEP TEST REPORT ( MAIL out TEST by Watch PAT)   STUDY DATA:  04-26-2023 Dustin Penny "Dustin Mckee" 67 year old male [birth year]  ORDERING ALINICIAN: Melvyn Novas, MD  REFERRING CLINICIAN: Dr Yetta Barre, PCP    CLINICAL INFORMATION/HISTORY: 03-06-2023; Chief concern according to patient :  " I don't feel as refreshed after sleeping with CPAP as  I used to, I also sleep better with my travel CPAP , but I don't know how it was st". ResMed machines, both. Nasal pillows.    I have the pleasure of seeing Dustin Mckee again , on  03/06/23 , he is a long term CPAP patient. His wife works for [employer], as a Child psychotherapist.       Chief complaint according to patient :" I don't have the same benefit by using CPAP".  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   Mr. Mitter reports that in the year 2004 he was diagnosed with sleep apnea at [facility] lab  and a CPAP machine was prescribed. About 6 years later , around 2010 ,there was an adjustment made to the pressures. Since then he did not require any changes and he has used his CPAP faithfully ever since. Since 2010 he has gained weight and that may be a factor in his resurgence of sleepiness.  He feels more fatigued , unable to multitask or focus as well. A download was obtained today in office showing that his CPAP ESCAPE model  has been used on average 6 hours and 47 minutes per day and that he has is to 30 out of 30 days. His compliance is 96% which is excellent.    Epworth sleepiness score: 4/24. FSS at 48/ 63 points.  GDS at 4/ 15 points.    BMI:  33.9 kg/m   Neck Circumference: 18"   FINDINGS:   Sleep Summary:   Total Recording Time (hours, min):   7 hours 12 minutes  Total Sleep Time (hours, min):    6 hours 37 minutes             Percent REM (%): 26.5%                                        Respiratory Indices by AASM criteria:   Calculated pAHI (per hour):   8.5/h                          REM pAHI:   16/h                                              NREM pAHI:    5.7/h                          Supine AHI: The patient slept 170 minutes in supine position with an AHI of 11.6/h,  followed by 149 minutes in left lateral position with an AHI of only 7/h  and 80 minutes in the right lateral position with an AHI of 5.4/h.   Snoring  data show a mean volume of 40 dB which is the threshold of pickup.  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 If this patient would have been a Medicare patient and we would apply CMS criteria he would not qualify for CPAP.  As long as he is a commercial and ensured patient following AASM criteria however, he is qualified to use CPAP.      RECOMMENDATION: CPAP would be considered optional, as avoiding supine sleep alone is likely to significantly reduce his apnea index .   I would offer the patient a trial of CPAP with a goal of using it for at least 90 days to see if it makes a difference in his daytime sleepiness and fatigue and also with his cognitive concerns.  Weight loss is also very important in reducing REM sleep apnea, and there should be a maximum effort directed towards weight loss.    INTERPRETING PHYSICIAN:   Melvyn Novas, MD , Founder of  [facility].

## 2023-04-30 ENCOUNTER — Encounter: Payer: Self-pay | Admitting: Neurology

## 2023-05-03 NOTE — Procedures (Signed)
      [facility]   HOME SLEEP TEST REPORT ( MAIL out TEST by Watch PAT)   STUDY DATA:  04-26-2023 Dustin Mckee "Dustin Mckee" 67 year old male 06/30/1956  ORDERING ALINICIAN: Shamel Galyean, MD  REFERRING CLINICIAN: Dr Jones, PCP    CLINICAL INFORMATION/HISTORY: 03-06-2023; Chief concern according to patient :  " I don't feel as refreshed after sleeping with CPAP as  I used to, I also sleep better with my travel CPAP , but I don't know how it was st". ResMed machines, both. Nasal pillows.    I have the pleasure of seeing Dustin Mckee again , on  03/06/23 , he is a long term CPAP patient. His wife works for [employer], as a social worker.       Chief complaint according to patient :" I don't have the same benefit by using CPAP".  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   [name] reports that in the year 2004 he was diagnosed with sleep apnea at [facility] lab  and a CPAP machine was prescribed. About 6 years later , around 2010 ,there was an adjustment made to the pressures. Since then he did not require any changes and he has used his CPAP faithfully ever since. Since 2010 he has gained weight and that may be a factor in his resurgence of sleepiness.  He feels more fatigued , unable to multitask or focus as well. A download was obtained today in office showing that his CPAP ESCAPE model  has been used on average 6 hours and 47 minutes per day and that he has is to 30 out of 30 days. His compliance is 96% which is excellent.    Epworth sleepiness score: 4/24. FSS at 48/ 63 points.  GDS at 4/ 15 points.    BMI:  33.9 kg/m   Neck Circumference: 18"   FINDINGS:   Sleep Summary:   Total Recording Time (hours, min):   7 hours 12 minutes  Total Sleep Time (hours, min):    6 hours 37 minutes             Percent REM (%): 26.5%                                        Respiratory Indices by AASM criteria:   Calculated pAHI (per hour):   8.5/h                          REM pAHI:   16/h                                              NREM pAHI:    5.7/h                          Supine AHI: The patient slept 170 minutes in supine position with an AHI of 11.6/h,  followed by 149 minutes in left lateral position with an AHI of only 7/h  and 80 minutes in the right lateral position with an AHI of 5.4/h.   Snoring   data show a mean volume of 40 dB which is the threshold of pickup.  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 If this patient would have been a Medicare patient and we would apply CMS criteria he would not qualify for CPAP.  As long as he is a commercial and ensured patient following AASM criteria however, he is qualified to use CPAP.      RECOMMENDATION: CPAP would be considered optional, as avoiding supine sleep alone is likely to significantly reduce his apnea index .   I would offer the patient a trial of CPAP with a goal of using it for at least 90 days to see if it makes a difference in his daytime sleepiness and fatigue and also with his cognitive concerns.  Weight loss is also very important in reducing REM sleep apnea, and there should be a maximum effort directed towards weight loss.    INTERPRETING PHYSICIAN:   Lea Walbert, MD , Founder of  [facility].                          

## 2023-05-03 NOTE — Addendum Note (Signed)
Addended by: Melvyn Novas on: 05/03/2023 02:52 PM   Modules accepted: Orders

## 2023-05-03 NOTE — Progress Notes (Signed)
Mild OSA, he can continue  using CPAP as long as AASM criteria are applied ( before he becomes a medicare patient under CMS criteria) , he should not sleep supine and lose weight.  If he likes to continue with CPAP (as he had benefit in the past) I will order an auto-titration device for him, urgently - 6-16 cm water, 3 cm EPR and humidifier and he should follow up on compliance before 07-13-2023.

## 2023-05-04 ENCOUNTER — Encounter: Payer: Self-pay | Admitting: Endocrinology

## 2023-05-05 ENCOUNTER — Telehealth: Payer: Self-pay

## 2023-05-05 NOTE — Telephone Encounter (Signed)
I called pt. I advised pt that Dr. Hulen Luster reviewed their sleep study results and found that pt has mild. Dr. Vickey Huger recommends PAP is optional. Pt would like to continue therapy. . I reviewed PAP compliance expectations with the pt. Pt is agreeable to starting a CPAP. I advised pt that an order will be sent to a DME, [facility], and [facility] will call the pt within about one week after they file with the pt's insurance. [facility] will show the pt how to use the machine, fit for masks, and troubleshoot the CPAP if needed. A follow up appt was made for insurance purposes with Dr. Vickey Huger 06/26/23. Pt verbalized understanding to arrive 15 minutes early and bring their CPAP. Pt verbalized understanding of results. Pt had no questions at this time but was encouraged to call back if questions arise. I have sent the order to [facility] and have received confirmation that they have received the order.

## 2023-05-05 NOTE — Telephone Encounter (Signed)
New, Almon Register, CMA; Kathe Becton; New, Andreas Ohm, Melissa Received, Thank you!       Previous Messages    ----- Message ----- From: Bobbye Morton, CMA Sent: 05/05/2023  11:58 AM EDT To: Penni Homans; Santina Evans; * Subject: CPAP                                          New orders have been placed for the above pt, DOB:01/12/1956 Thanks

## 2023-05-05 NOTE — Telephone Encounter (Signed)
-----   Message from Melvyn Novas, MD sent at 05/03/2023  2:52 PM EDT ----- Mild OSA, he can continue  using CPAP as long as AASM criteria are applied ( before he becomes a medicare patient under CMS criteria) , he should not sleep supine and lose weight.  If he likes to continue with CPAP (as he had benefit in the past) I will order an auto-titration device for him, urgently - 6-16 cm water, 3 cm EPR and humidifier and he should follow up on compliance before 07-13-2023.

## 2023-05-07 ENCOUNTER — Other Ambulatory Visit: Payer: Self-pay | Admitting: Endocrinology

## 2023-06-26 ENCOUNTER — Encounter: Payer: No Typology Code available for payment source | Admitting: Neurology

## 2023-07-10 NOTE — Progress Notes (Signed)
[facility] [address] [place]. [place] [address] [phone]       OFFICE FOLLOW UP NOTE  [name] Date of Birth:  Apr 14, 1956 Medical Record Number:  [id number]    Primary neurologist: Dr. Vickey Huger Reason for visit: Initial CPAP follow-up    SUBJECTIVE:   CHIEF COMPLAINT:  Chief Complaint  Patient presents with   Follow-up    Rm 3, here alone  Pt is here for initial CPAP.  Pt states he is sleeping about 6 hours at night.    Follow-up visit:  Prior visit: 03/06/2023 with Dr. Vickey Huger  Brief HPI:   Dustin Mckee is a 67 y.o. male who was initially evaluated by Dr. Vickey Huger on 03/06/2023 with known history of sleep apnea initially diagnosed in 2004 and ongoing use of CPAP.  Complaint of not feeling as refreshed with CPAP use.  Most recent sleep study 2018 and received new CPAP machine.  Repeat HST 03/31/2023 showed mild sleep apnea with total AHI 8.5/h and REM AHI 16/h.  Received new CPAP 05/20/2023.   Interval history:  Patient is being seen for initial CPAP compliance.  Compliance report shows excellent usage and optimal residual AHI.  Tolerating CPAP well, current use of nasal pillows. No questions or concerns today.  Followed by DME [facility].  ESS 2/24.           ROS:   14 system review of systems performed and negative with exception of those listed in HPI  PMH:  Past Medical History:  Diagnosis Date   Allergy    Hyperlipidemia    Hypertension    Prediabetes    Sleep apnea    CPAP    PSH:  Past Surgical History:  Procedure Laterality Date   APPENDECTOMY     TOOTH EXTRACTION     VASECTOMY      Social History:  Social History   Socioeconomic History   Marital status: Married    Spouse name: [name]   Number of children: Not on file   Years of education: Not on file   Highest education level: Not on file  Occupational History   Not on file  Tobacco Use   Smoking status: Never   Smokeless tobacco:  Never  Substance and Sexual Activity   Alcohol use: No   Drug use: No   Sexual activity: Yes    Partners: Female    Birth control/protection: None  Other Topics Concern   Not on file  Social History Narrative   Drinks 1-2 cups caffeine daily.   Social Determinants of Health   Financial Resource Strain: Not on file  Food Insecurity: Not on file  Transportation Needs: Not on file  Physical Activity: Not on file  Stress: Not on file  Social Connections: Not on file  Intimate Partner Violence: Not on file    Family History:  Family History  Problem Relation Age of Onset   Heart disease Mother    Kidney disease Mother    Heart disease Father        defibrillator/CHF.   Diabetes Father    Heart disease Sister        CHF   Diabetes Sister    Heart disease Sister        CHF    Medications:   Current Outpatient Medications on File Prior to Visit  Medication Sig Dispense Refill   Multiple Vitamins-Minerals (MENS 50+ ADVANCED PO) Take by mouth.     rosuvastatin (CRESTOR) 20  MG tablet TAKE 1 TABLET BY MOUTH EVERY DAY 90 tablet 1   No current facility-administered medications on file prior to visit.    Allergies:   Allergies  Allergen Reactions   Iodine Hives      OBJECTIVE:  Physical Exam  Vitals:   07/15/23 0909  BP: (!) 156/87  Pulse: 70  Weight: 255 lb (115.7 kg)  Height: 6\' 3"  (1.905 m)   Body mass index is 31.87 kg/m. No results found.   General: well developed, well nourished, very pleasant middle-age African-American male, seated, in no evident distress Head: head normocephalic and atraumatic.   Neck: supple with no carotid or supraclavicular bruits Cardiovascular: regular rate and rhythm, no murmurs Musculoskeletal: no deformity Skin:  no rash/petichiae Vascular:  Normal pulses all extremities   Neurologic Exam Mental Status: Awake and fully alert. Oriented to place and time. Recent and remote memory intact. Attention span, concentration and  fund of knowledge appropriate. Mood and affect appropriate.  Cranial Nerves: Pupils equal, briskly reactive to light. Extraocular movements full without nystagmus. Visual fields full to confrontation. Hearing intact. Facial sensation intact. Face, tongue, palate moves normally and symmetrically.  Motor: Normal bulk and tone. Normal strength in all tested extremity muscles Sensory.: intact to touch , pinprick , position and vibratory sensation.  Coordination: Rapid alternating movements normal in all extremities. Finger-to-nose and heel-to-shin performed accurately bilaterally. Gait and Station: Arises from chair without difficulty. Stance is normal. Gait demonstrates normal stride length and balance without use of AD.  Reflexes: 1+ and symmetric. Toes downgoing.         ASSESSMENT/PLAN: Dustin Mckee is a 67 y.o. year old male    OSA on CPAP : Compliance report shows satisfactory usage with optimal residual AHI.  Continue current pressure settings.  Discussed continued nightly usage with ensuring greater than 4 hours nightly for optimal benefit and per insurance purposes.  Continue to follow with DME company for any needed supplies or CPAP related concerns     Follow up in 1 year or call earlier if needed   CC:  PCP: Etta Grandchild, MD    I spent 25 minutes of face-to-face and non-face-to-face time with patient.  This included previsit chart review, lab review, study review, order entry, electronic health record documentation, patient education regarding diagnosis of sleep apnea with review and discussion of compliance report and answered all other questions to patient's satisfaction   Ihor Austin, [facility]  [facility] [address] [place], [place] [address]  Phone [phone] Fax [phone] Note: This document was prepared with digital dictation and possible smart phrase technology. Any transcriptional errors that result from this  process are unintentional.

## 2023-07-15 ENCOUNTER — Encounter: Payer: Self-pay | Admitting: Adult Health

## 2023-07-15 ENCOUNTER — Ambulatory Visit: Payer: No Typology Code available for payment source | Admitting: Adult Health

## 2023-07-15 VITALS — BP 156/87 | HR 70 | Ht 75.0 in | Wt 255.0 lb

## 2023-07-15 DIAGNOSIS — G4733 Obstructive sleep apnea (adult) (pediatric): Secondary | ICD-10-CM

## 2023-07-15 NOTE — Patient Instructions (Signed)
Your Plan:  Continue nightly use of CPAP for adequate sleep apnea management  Continue to follow with your DME company [employer] [facility] for any needed supplies or CPAP related concerns    Follow-up in 1 year or call earlier if needed     Thank you for coming to see [place] at [facility]. I hope we have been able to provide you high quality care today.  You may receive a patient satisfaction survey over the next few weeks. We would appreciate your feedback and comments so that we may continue to improve ourselves and the health of our patients.

## 2023-07-16 ENCOUNTER — Encounter: Payer: No Typology Code available for payment source | Admitting: Neurology

## 2023-07-22 ENCOUNTER — Telehealth: Payer: Self-pay

## 2023-07-30 ENCOUNTER — Encounter: Payer: Self-pay | Admitting: Pharmacist

## 2023-07-31 ENCOUNTER — Other Ambulatory Visit: Payer: Self-pay | Admitting: Internal Medicine

## 2023-07-31 DIAGNOSIS — E785 Hyperlipidemia, unspecified: Secondary | ICD-10-CM

## 2023-08-11 ENCOUNTER — Ambulatory Visit: Payer: No Typology Code available for payment source | Admitting: Internal Medicine

## 2023-08-11 VITALS — BP 162/92 | HR 67 | Temp 97.7°F | Resp 16 | Ht 75.0 in | Wt 258.0 lb

## 2023-08-11 DIAGNOSIS — E785 Hyperlipidemia, unspecified: Secondary | ICD-10-CM

## 2023-08-11 DIAGNOSIS — I119 Hypertensive heart disease without heart failure: Secondary | ICD-10-CM | POA: Diagnosis not present

## 2023-08-11 DIAGNOSIS — R350 Frequency of micturition: Secondary | ICD-10-CM

## 2023-08-11 DIAGNOSIS — R7303 Prediabetes: Secondary | ICD-10-CM | POA: Diagnosis not present

## 2023-08-11 DIAGNOSIS — R972 Elevated prostate specific antigen [PSA]: Secondary | ICD-10-CM

## 2023-08-11 DIAGNOSIS — I10 Essential (primary) hypertension: Secondary | ICD-10-CM

## 2023-08-11 DIAGNOSIS — R7989 Other specified abnormal findings of blood chemistry: Secondary | ICD-10-CM | POA: Diagnosis not present

## 2023-08-11 DIAGNOSIS — N401 Enlarged prostate with lower urinary tract symptoms: Secondary | ICD-10-CM

## 2023-08-11 DIAGNOSIS — Z23 Encounter for immunization: Secondary | ICD-10-CM

## 2023-08-11 LAB — PSA: PSA: 4.45 ng/mL — ABNORMAL HIGH (ref 0.10–4.00)

## 2023-08-11 LAB — CBC WITH DIFFERENTIAL/PLATELET
Basophils Absolute: 0 10*3/uL (ref 0.0–0.1)
Basophils Relative: 0.5 % (ref 0.0–3.0)
Eosinophils Absolute: 0.1 10*3/uL (ref 0.0–0.7)
Eosinophils Relative: 1.2 % (ref 0.0–5.0)
HCT: 48 % (ref 39.0–52.0)
Hemoglobin: 15.9 g/dL (ref 13.0–17.0)
Lymphocytes Relative: 36.8 % (ref 12.0–46.0)
Lymphs Abs: 2.4 10*3/uL (ref 0.7–4.0)
MCHC: 33.1 g/dL (ref 30.0–36.0)
MCV: 86 fL (ref 78.0–100.0)
Monocytes Absolute: 0.4 10*3/uL (ref 0.1–1.0)
Monocytes Relative: 6.1 % (ref 3.0–12.0)
Neutro Abs: 3.6 10*3/uL (ref 1.4–7.7)
Neutrophils Relative %: 55.4 % (ref 43.0–77.0)
Platelets: 258 10*3/uL (ref 150.0–400.0)
RBC: 5.59 Mil/uL (ref 4.22–5.81)
RDW: 14.5 % (ref 11.5–15.5)
WBC: 6.4 10*3/uL (ref 4.0–10.5)

## 2023-08-11 LAB — HEMOGLOBIN A1C: Hgb A1c MFr Bld: 5.8 % (ref 4.6–6.5)

## 2023-08-11 LAB — TSH: TSH: 2.38 u[IU]/mL (ref 0.35–5.50)

## 2023-08-11 NOTE — Patient Instructions (Signed)
Hypertension, Adult High blood pressure (hypertension) is when the force of blood pumping through the arteries is too strong. The arteries are the blood vessels that carry blood from the heart throughout the body. Hypertension forces the heart to work harder to pump blood and may cause arteries to become narrow or stiff. Untreated or uncontrolled hypertension can lead to a heart attack, heart failure, a stroke, kidney disease, and other problems. A blood pressure reading consists of a higher number over a lower number. Ideally, your blood pressure should be below 120/80. The first ("top") number is called the systolic pressure. It is a measure of the pressure in your arteries as your heart beats. The second ("bottom") number is called the diastolic pressure. It is a measure of the pressure in your arteries as the heart relaxes. What are the causes? The exact cause of this condition is not known. There are some conditions that result in high blood pressure. What increases the risk? Certain factors may make you more likely to develop high blood pressure. Some of these risk factors are under your control, including: Smoking. Not getting enough exercise or physical activity. Being overweight. Having too much fat, sugar, calories, or salt (sodium) in your diet. Drinking too much alcohol. Other risk factors include: Having a personal history of heart disease, diabetes, high cholesterol, or kidney disease. Stress. Having a family history of high blood pressure and high cholesterol. Having obstructive sleep apnea. Age. The risk increases with age. What are the signs or symptoms? High blood pressure may not cause symptoms. Very high blood pressure (hypertensive crisis) may cause: Headache. Fast or irregular heartbeats (palpitations). Shortness of breath. Nosebleed. Nausea and vomiting. Vision changes. Severe chest pain, dizziness, and seizures. How is this diagnosed? This condition is diagnosed by  measuring your blood pressure while you are seated, with your arm resting on a flat surface, your legs uncrossed, and your feet flat on the floor. The cuff of the blood pressure monitor will be placed directly against the skin of your upper arm at the level of your heart. Blood pressure should be measured at least twice using the same arm. Certain conditions can cause a difference in blood pressure between your right and left arms. If you have a high blood pressure reading during one visit or you have normal blood pressure with other risk factors, you may be asked to: Return on a different day to have your blood pressure checked again. Monitor your blood pressure at home for 1 week or longer. If you are diagnosed with hypertension, you may have other blood or imaging tests to help your health care provider understand your overall risk for other conditions. How is this treated? This condition is treated by making healthy lifestyle changes, such as eating healthy foods, exercising more, and reducing your alcohol intake. You may be referred for counseling on a healthy diet and physical activity. Your health care provider may prescribe medicine if lifestyle changes are not enough to get your blood pressure under control and if: Your systolic blood pressure is above 130. Your diastolic blood pressure is above 80. Your personal target blood pressure may vary depending on your medical conditions, your age, and other factors. Follow these instructions at home: Eating and drinking  Eat a diet that is high in fiber and potassium, and low in sodium, added sugar, and fat. An example of this eating plan is called the DASH diet. DASH stands for Dietary Approaches to Stop Hypertension. To eat this way: Eat   plenty of fresh fruits and vegetables. Try to fill one half of your plate at each meal with fruits and vegetables. Eat whole grains, such as whole-wheat pasta, brown rice, or whole-grain bread. Fill about one  fourth of your plate with whole grains. Eat or drink low-fat dairy products, such as skim milk or low-fat yogurt. Avoid fatty cuts of meat, processed or cured meats, and poultry with skin. Fill about one fourth of your plate with lean proteins, such as fish, chicken without skin, beans, eggs, or tofu. Avoid pre-made and processed foods. These tend to be higher in sodium, added sugar, and fat. Reduce your daily sodium intake. Many people with hypertension should eat less than 1,500 mg of sodium a day. Do not drink alcohol if: Your health care provider tells you not to drink. You are pregnant, may be pregnant, or are planning to become pregnant. If you drink alcohol: Limit how much you have to: 0-1 drink a day for women. 0-2 drinks a day for men. Know how much alcohol is in your drink. In the [place] one drink equals one 12 oz bottle of beer (355 mL), one 5 oz glass of wine (148 mL), or one 1 oz glass of hard liquor (44 mL). Lifestyle  Work with your health care provider to maintain a healthy body weight or to lose weight. Ask what an ideal weight is for you. Get at least 30 minutes of exercise that causes your heart to beat faster (aerobic exercise) most days of the week. Activities may include walking, swimming, or biking. Include exercise to strengthen your muscles (resistance exercise), such as Pilates or lifting weights, as part of your weekly exercise routine. Try to do these types of exercises for 30 minutes at least 3 days a week. Do not use any products that contain nicotine or tobacco. These products include cigarettes, chewing tobacco, and vaping devices, such as e-cigarettes. If you need help quitting, ask your health care provider. Monitor your blood pressure at home as told by your health care provider. Keep all follow-up visits. This is important. Medicines Take over-the-counter and prescription medicines only as told by your health care provider. Follow directions carefully. Blood  pressure medicines must be taken as prescribed. Do not skip doses of blood pressure medicine. Doing this puts you at risk for problems and can make the medicine less effective. Ask your health care provider about side effects or reactions to medicines that you should watch for. Contact a health care provider if you: Think you are having a reaction to a medicine you are taking. Have headaches that keep coming back (recurring). Feel dizzy. Have swelling in your ankles. Have trouble with your vision. Get help right away if you: Develop a severe headache or confusion. Have unusual weakness or numbness. Feel faint. Have severe pain in your chest or abdomen. Vomit repeatedly. Have trouble breathing. These symptoms may be an emergency. Get help right away. Call 911. Do not wait to see if the symptoms will go away. Do not drive yourself to the hospital. Summary Hypertension is when the force of blood pumping through your arteries is too strong. If this condition is not controlled, it may put you at risk for serious complications. Your personal target blood pressure may vary depending on your medical conditions, your age, and other factors. For most people, a normal blood pressure is less than 120/80. Hypertension is treated with lifestyle changes, medicines, or a combination of both. Lifestyle changes include losing weight, eating a healthy,   low-sodium diet, exercising more, and limiting alcohol. This information is not intended to replace advice given to you by your health care provider. Make sure you discuss any questions you have with your health care provider. Document Revised: 09/04/2021 Document Reviewed: 09/04/2021 Elsevier Patient Education  2024 [employer]  

## 2023-08-11 NOTE — Progress Notes (Unsigned)
Subjective:  Patient ID: Dustin Mckee, male    DOB: 01/31/1956  Age: 67 y.o. MRN: [id number]  CC: Hypertension and Hyperlipidemia   HPI [name] presents for f/up ---  Discussed the use of AI scribe software for clinical note transcription with the patient, who gave verbal consent to proceed.  History of Present Illness   The patient, with a history of prediabetes and hypertension, reports feeling generally well and has been attempting to lose weight. They admit to not being as active as they had planned. They deny experiencing any chest pain, shortness of breath, dizziness, or lightheadedness during physical activity. They also deny any symptoms suggestive of diabetes, such as excessive thirst or urination, and any drastic changes in weight or appetite.  The patient had an EKG performed within the year, which they report was normal. They have also recently received their flu and COVID vaccines. They express willingness to receive a pneumonia vaccine, as recommended due to their age.  The patient has been experiencing high blood pressure readings, with a recent measurement of 160/88 on the left and 162/92 on the right. They report that they had been prescribed blood pressure medication by a previous doctor, but they have not been taking it recently. They usually monitor their blood pressure at home and have not observed readings as high as those recorded during the current consultation.  This finding has reportedly worsened since their last EKG.       Outpatient Medications Prior to Visit  Medication Sig Dispense Refill   Multiple Vitamins-Minerals (MENS 50+ ADVANCED PO) Take by mouth.     rosuvastatin (CRESTOR) 20 MG tablet TAKE 1 TABLET BY MOUTH EVERY DAY 90 tablet 0   No facility-administered medications prior to visit.    ROS Review of Systems  Objective:  BP (!) 162/92 (BP Location: Left Arm, Patient Position: Sitting, Cuff Size: Large)   Pulse 67   Temp 97.7 F  (36.5 C) (Oral)   Resp 16   Ht 6\' 3"  (1.905 m)   Wt 258 lb (117 kg)   SpO2 96%   BMI 32.25 kg/m   BP Readings from Last 3 Encounters:  08/11/23 (!) 162/92  07/15/23 (!) 156/87  03/06/23 (!) 144/80    Wt Readings from Last 3 Encounters:  08/11/23 258 lb (117 kg)  07/15/23 255 lb (115.7 kg)  03/06/23 271 lb (122.9 kg)    Physical Exam Cardiovascular:     Rate and Rhythm: Normal rate and regular rhythm.     Heart sounds: Normal heart sounds, S1 normal and S2 normal.     No friction rub. No gallop.     Comments: EKG- NSR, 70 bpm Minimal LVH No Q waves or ST/T wave changes Musculoskeletal:     Right lower leg: No edema.     Left lower leg: No edema.     Lab Results  Component Value Date   WBC 6.5 10/24/2022   HGB 16.1 10/24/2022   HCT 47.2 10/24/2022   PLT 279.0 10/24/2022   GLUCOSE 107 (H) 01/08/2023   CHOL 274 (H) 10/24/2022   TRIG 154.0 (H) 10/24/2022   HDL 46.30 10/24/2022   LDLCALC 197 (H) 10/24/2022   ALT 60 (H) 10/24/2022   AST 31 10/24/2022   NA 139 10/24/2022   K 4.1 10/24/2022   CL 100 10/24/2022   CREATININE 1.22 10/24/2022   BUN 13 10/24/2022   CO2 30 10/24/2022   TSH 2.03 10/24/2022   PSA 3.57 10/24/2022  HGBA1C 6.1 10/24/2022    US Abdomen Limited RUQ (LIVER/GB)  Result Date: 11/08/2022 CLINICAL DATA:  Elevated LFTs EXAM: ULTRASOUND ABDOMEN LIMITED RIGHT UPPER QUADRANT COMPARISON:  None Available. FINDINGS: Gallbladder: No gallstones or wall thickening visualized. No sonographic Murphy sign noted by sonographer. Common bile duct: Diameter: 2.5 mm Liver: Increased echogenicity. No focal lesion. Portal vein is patent on color Doppler imaging with normal direction of blood flow towards the liver. Other: None. IMPRESSION: 1. Increased hepatic parenchymal echogenicity suggestive of steatosis. 2. No cholelithiasis or sonographic evidence for acute cholecystitis. Electronically Signed   By: Annia Belt M.D.   On: 11/08/2022 10:15    Assessment &  Plan:  Need for vaccination -     Pneumococcal conjugate vaccine 20-valent  Hypertensive left ventricular hypertrophy, without heart failure -     EKG 12-Lead -     TSH; Future -     Urinalysis, Routine w reflex microscopic; Future -     Basic metabolic panel; Future -     CBC with Differential/Platelet; Future  Primary hypertension -     EKG 12-Lead -     TSH; Future -     Urinalysis, Routine w reflex microscopic; Future -     Basic metabolic panel; Future -     CBC with Differential/Platelet; Future  Elevated LFTs -     Hepatic function panel; Future  Dyslipidemia, goal LDL below 100 -     Lipid panel; Future -     TSH; Future -     Hepatic function panel; Future  Prediabetes -     Hemoglobin A1c; Future -     Basic metabolic panel; Future  Benign prostatic hyperplasia with urinary frequency -     PSA; Future     Follow-up: Return in about 3 months (around 11/10/2023).  Sanda Linger, MD

## 2023-08-12 DIAGNOSIS — R7303 Prediabetes: Secondary | ICD-10-CM | POA: Insufficient documentation

## 2023-08-12 DIAGNOSIS — R972 Elevated prostate specific antigen [PSA]: Secondary | ICD-10-CM | POA: Insufficient documentation

## 2023-08-12 DIAGNOSIS — E785 Hyperlipidemia, unspecified: Secondary | ICD-10-CM | POA: Insufficient documentation

## 2023-08-12 LAB — URINALYSIS, ROUTINE W REFLEX MICROSCOPIC
Bilirubin Urine: NEGATIVE
Hgb urine dipstick: NEGATIVE
Ketones, ur: NEGATIVE
Leukocytes,Ua: NEGATIVE
Nitrite: NEGATIVE
RBC / HPF: NONE SEEN (ref 0–?)
Specific Gravity, Urine: <=1.005 — AB (ref 1.000–1.030)
Total Protein, Urine: NEGATIVE
Urine Glucose: NEGATIVE
Urobilinogen, UA: 0.2 (ref 0.0–1.0)
pH: 6.5 (ref 5.0–8.0)

## 2023-08-12 LAB — BASIC METABOLIC PANEL
BUN: 13 mg/dL (ref 6–23)
CO2: 29 meq/L (ref 19–32)
Calcium: 9.9 mg/dL (ref 8.4–10.5)
Chloride: 101 meq/L (ref 96–112)
Creatinine, Ser: 1.1 mg/dL (ref 0.40–1.50)
GFR: 70.67 mL/min (ref >60.00)
Glucose, Bld: 93 mg/dL (ref 70–99)
Potassium: 4.1 meq/L (ref 3.5–5.1)
Sodium: 140 meq/L (ref 135–145)

## 2023-08-12 LAB — HEPATIC FUNCTION PANEL
ALT: 29 U/L (ref 0–53)
AST: 22 U/L (ref 0–37)
Albumin: 4.7 g/dL (ref 3.5–5.2)
Alkaline Phosphatase: 41 U/L (ref 39–117)
Bilirubin, Direct: 0.1 mg/dL (ref 0.0–0.3)
Total Bilirubin: 0.7 mg/dL (ref 0.2–1.2)
Total Protein: 7.5 g/dL (ref 6.0–8.3)

## 2023-08-12 LAB — LIPID PANEL
Cholesterol: 133 mg/dL (ref 0–200)
HDL: 46.8 mg/dL (ref >39.00)
LDL Cholesterol: 68 mg/dL (ref 0–99)
NonHDL: 86.21
Total CHOL/HDL Ratio: 3
Triglycerides: 91 mg/dL (ref 0.0–149.0)
VLDL: 18.2 mg/dL (ref 0.0–40.0)

## 2023-08-12 MED ORDER — TRIAMTERENE-HCTZ 37.5-25 MG PO CAPS: 1.0000 | ORAL_CAPSULE | Freq: Every day | ORAL | 0 refills | Status: DC

## 2023-08-21 ENCOUNTER — Ambulatory Visit: Payer: No Typology Code available for payment source | Admitting: Internal Medicine

## 2023-08-27 ENCOUNTER — Ambulatory Visit
Admission: RE | Admit: 2023-08-27 | Discharge: 2023-08-27 | Disposition: A | Discharge status: Home / Self Care | Payer: No Typology Code available for payment source | Source: Ambulatory Visit | Attending: Internal Medicine | Admitting: Internal Medicine

## 2023-08-27 DIAGNOSIS — E785 Hyperlipidemia, unspecified: Secondary | ICD-10-CM

## 2023-11-06 ENCOUNTER — Other Ambulatory Visit: Payer: Self-pay | Admitting: Internal Medicine

## 2023-11-06 DIAGNOSIS — I10 Essential (primary) hypertension: Secondary | ICD-10-CM

## 2023-11-06 DIAGNOSIS — I119 Hypertensive heart disease without heart failure: Secondary | ICD-10-CM

## 2023-11-06 NOTE — Telephone Encounter (Signed)
Copied from CRM [id number]. Topic: Clinical - Medication Refill >> Nov 06, 2023  8:42 AM Elizebeth Brooking wrote: Most Recent Primary Care Visit:  Provider: Etta Grandchild  Department: [facility] [place]  Visit Type: OFFICE VISIT  Date: 08/11/2023  Medication: ***  Has the patient contacted their pharmacy?  (Agent: If no, request that the patient contact the pharmacy for the refill. If patient does not wish to contact the pharmacy document the reason why and proceed with request.) (Agent: If yes, when and what did the pharmacy advise?)  Is this the correct pharmacy for this prescription?  If no, delete pharmacy and type the correct one.  This is the patient's preferred pharmacy:  [id number], [place] - [address] [place] [address] Phone: [phone] Fax: [phone]   Has the prescription been filled recently?   Is the patient out of the medication?   Has the patient been seen for an appointment in the last year OR does the patient have an upcoming appointment?   Can we respond through MyChart?   Agent: Please be advised that Rx refills may take up to 3 business days. We ask that you follow-up with your pharmacy.

## 2023-11-06 NOTE — Telephone Encounter (Signed)
Copied from CRM [id number]. Topic: Clinical - Medication Refill >> Nov 06, 2023  8:42 AM Elizebeth Brooking wrote: Most Recent Primary Care Visit:  Provider: Etta Grandchild  Department: [facility] [place]  Visit Type: OFFICE VISIT  Date: 08/11/2023  Medication: triamterene-hydrochlorothiazide (DYAZIDE) 37.5-25 MG capsule  Has the patient contacted their pharmacy? Yes (Agent: If no, request that the patient contact the pharmacy for the refill. If patient does not wish to contact the pharmacy document the reason why and proceed with request.) (Agent: If yes, when and what did the pharmacy advise?)  Is this the correct pharmacy for this prescription? Yes If no, delete pharmacy and type the correct one.  This is the patient's preferred pharmacy:  [id number], [place] - [address] [place] [address] Phone: [phone] Fax: [phone]   Has the prescription been filled recently? No  Is the patient out of the medication? No  Has the patient been seen for an appointment in the last year OR does the patient have an upcoming appointment? Yes  Can we respond through MyChart? Yes  Agent: Please be advised that Rx refills may take up to 3 business days. We ask that you follow-up with your pharmacy.

## 2023-11-07 ENCOUNTER — Other Ambulatory Visit: Payer: Self-pay | Admitting: Internal Medicine

## 2023-11-07 DIAGNOSIS — I10 Essential (primary) hypertension: Secondary | ICD-10-CM

## 2023-11-07 DIAGNOSIS — I119 Hypertensive heart disease without heart failure: Secondary | ICD-10-CM

## 2023-11-10 ENCOUNTER — Other Ambulatory Visit: Payer: Self-pay | Admitting: Internal Medicine

## 2023-11-10 DIAGNOSIS — I119 Hypertensive heart disease without heart failure: Secondary | ICD-10-CM

## 2023-11-10 DIAGNOSIS — I10 Essential (primary) hypertension: Secondary | ICD-10-CM

## 2023-11-10 MED ORDER — TRIAMTERENE-HCTZ 37.5-25 MG PO CAPS
1.0000 | ORAL_CAPSULE | Freq: Every day | ORAL | 0 refills | Status: DC
Start: 2023-11-10 — End: 2024-02-06

## 2023-11-10 NOTE — Telephone Encounter (Signed)
Copied from CRM [id number]. Topic: Clinical - Medication Refill >> Nov 10, 2023  3:35 PM Leavy Cella D wrote: Most Recent Primary Care Visit:  Provider: Etta Grandchild  Department: [facility] [place]  Visit Type: OFFICE VISIT  Date: 08/11/2023  Medication: triamterene-hydrochlorothiazide (DYAZIDE) 37.5-25 MG capsule  Has the patient contacted their pharmacy? Yes (Agent: If no, request that the patient contact the pharmacy for the refill. If patient does not wish to contact the pharmacy document the reason why and proceed with request.) (Agent: If yes, when and what did the pharmacy advise?)   Is this the correct pharmacy for this prescription? Yes If no, delete pharmacy and type the correct one.  This is the patient's preferred pharmacy:  [id number], [place] - [address] [place] [address] Phone: [phone] Fax: [phone]   Has the prescription been filled recently? No  Is the patient out of the medication? Yes  Has the patient been seen for an appointment in the last year OR does the patient have an upcoming appointment? Yes  Can we respond through MyChart? Yes  Agent: Please be advised that Rx refills may take up to 3 business days. We ask that you follow-up with your pharmacy.

## 2023-12-03 ENCOUNTER — Encounter: Payer: Self-pay | Admitting: Internal Medicine

## 2023-12-03 ENCOUNTER — Ambulatory Visit: Payer: No Typology Code available for payment source | Admitting: Internal Medicine

## 2023-12-03 VITALS — BP 146/74 | HR 84 | Temp 98.1°F | Resp 16 | Ht 75.0 in | Wt 263.6 lb

## 2023-12-03 DIAGNOSIS — K219 Gastro-esophageal reflux disease without esophagitis: Secondary | ICD-10-CM

## 2023-12-03 DIAGNOSIS — I10 Essential (primary) hypertension: Secondary | ICD-10-CM

## 2023-12-03 DIAGNOSIS — R972 Elevated prostate specific antigen [PSA]: Secondary | ICD-10-CM | POA: Diagnosis not present

## 2023-12-03 DIAGNOSIS — Z0001 Encounter for general adult medical examination with abnormal findings: Secondary | ICD-10-CM | POA: Diagnosis not present

## 2023-12-03 LAB — BASIC METABOLIC PANEL
BUN: 19 mg/dL (ref 6–23)
CO2: 29 meq/L (ref 19–32)
Calcium: 10.1 mg/dL (ref 8.4–10.5)
Chloride: 101 meq/L (ref 96–112)
Creatinine, Ser: 1.23 mg/dL (ref 0.40–1.50)
GFR: 61.67 mL/min (ref >60.00)
Glucose, Bld: 119 mg/dL — ABNORMAL HIGH (ref 70–99)
Potassium: 3.7 meq/L (ref 3.5–5.1)
Sodium: 140 meq/L (ref 135–145)

## 2023-12-03 LAB — URINALYSIS, ROUTINE W REFLEX MICROSCOPIC
Bilirubin Urine: NEGATIVE
Hgb urine dipstick: NEGATIVE
Ketones, ur: NEGATIVE
Leukocytes,Ua: NEGATIVE
Nitrite: NEGATIVE
Specific Gravity, Urine: 1.02 (ref 1.000–1.030)
Total Protein, Urine: NEGATIVE
Urine Glucose: NEGATIVE
Urobilinogen, UA: 0.2 (ref 0.0–1.0)
pH: 6 (ref 5.0–8.0)

## 2023-12-03 LAB — CBC WITH DIFFERENTIAL/PLATELET
Basophils Absolute: 0 10*3/uL (ref 0.0–0.1)
Basophils Relative: 0.4 % (ref 0.0–3.0)
Eosinophils Absolute: 0.1 10*3/uL (ref 0.0–0.7)
Eosinophils Relative: 1.2 % (ref 0.0–5.0)
HCT: 48.1 % (ref 39.0–52.0)
Hemoglobin: 16.3 g/dL (ref 13.0–17.0)
Lymphocytes Relative: 27.1 % (ref 12.0–46.0)
Lymphs Abs: 1.9 10*3/uL (ref 0.7–4.0)
MCHC: 33.8 g/dL (ref 30.0–36.0)
MCV: 86.6 fL (ref 78.0–100.0)
Monocytes Absolute: 0.4 10*3/uL (ref 0.1–1.0)
Monocytes Relative: 5.8 % (ref 3.0–12.0)
Neutro Abs: 4.6 10*3/uL (ref 1.4–7.7)
Neutrophils Relative %: 65.5 % (ref 43.0–77.0)
Platelets: 277 10*3/uL (ref 150.0–400.0)
RBC: 5.55 Mil/uL (ref 4.22–5.81)
RDW: 15 % (ref 11.5–15.5)
WBC: 7 10*3/uL (ref 4.0–10.5)

## 2023-12-03 LAB — PSA: PSA: 4.04 ng/mL — ABNORMAL HIGH (ref 0.10–4.00)

## 2023-12-03 MED ORDER — AMLODIPINE BESYLATE 5 MG PO TABS: 5.0000 mg | ORAL_TABLET | Freq: Every day | ORAL | 1 refills | Status: DC

## 2023-12-03 NOTE — Progress Notes (Signed)
Subjective:  Patient ID: Dustin Mckee, male    DOB: 02-18-1956  Age: 68 y.o. MRN: [id number]  CC: Annual Exam, Hypertension, and Hyperlipidemia   HPI Dustin Mckee presents for a CPX and f/up ---  Discussed the use of AI scribe software for clinical note transcription with the patient, who gave verbal consent to proceed.  History of Present Illness   The patient, with a history of hypertension, presents for a follow-up visit expressing concerns about his blood pressure. He reports experiencing occasional morning headaches for the past couple of months, which he suspects may be related to his blood pressure. He has been monitoring his blood pressure at home, noting readings around 120-130/80-90 mmHg, which he considers closer to normal. However, he has observed higher readings during doctor's visits, with a recent reading of 140/something. He denies any visual disturbances, chest pain, or shortness of breath.  The patient is currently on a regimen of antihypertensive medications, including diuretics, which he does not believe are causing any side effects. He has not noticed any swelling in his legs or feet. He maintains an active lifestyle, involving a lot of walking and physical work related to his job in [employer]. He denies any smoking or drinking habits.  In addition to hypertension, the patient has a history of elevated PSA. However, he denies any urinary symptoms such as painful or frequent urination, or hematuria. He also denies any family history of colon or prostate cancer.  The patient acknowledges a slight weight gain over the holiday season but does not express any other health concerns. He is compliant with his current medication regimen and has received his flu and COVID vaccines.       Outpatient Medications Prior to Visit  Medication Sig Dispense Refill   Multiple Vitamins-Minerals (MENS 50+ ADVANCED PO) Take by mouth.     rosuvastatin (CRESTOR) 20 MG  tablet TAKE 1 TABLET BY MOUTH EVERY DAY 90 tablet 0   triamterene-hydrochlorothiazide (DYAZIDE) 37.5-25 MG capsule Take 1 each (1 capsule total) by mouth daily. 90 capsule 0   No facility-administered medications prior to visit.    ROS Review of Systems  Constitutional:  Positive for unexpected weight change (wt gain). Negative for appetite change, chills, diaphoresis and fatigue.  HENT: Negative.    Eyes:  Negative for visual disturbance.  Respiratory:  Negative for cough, chest tightness, shortness of breath and wheezing.   Cardiovascular:  Negative for chest pain, palpitations and leg swelling.  Gastrointestinal:  Negative for abdominal pain, constipation and vomiting.  Genitourinary: Negative.  Negative for difficulty urinating.  Musculoskeletal: Negative.  Negative for arthralgias and myalgias.  Skin: Negative.   Neurological:  Positive for headaches. Negative for dizziness, weakness and light-headedness.  Hematological:  Negative for adenopathy. Does not bruise/bleed easily.  Psychiatric/Behavioral: Negative.      Objective:  BP (!) 146/74 (BP Location: Left Arm, Patient Position: Sitting, Cuff Size: Large) Comment: BP (R) 148/70 (L) 146/74  Pulse 84   Temp 98.1 F (36.7 C) (Oral)   Resp 16   Ht 6\' 3"  (1.905 m)   Wt 263 lb 9.6 oz (119.6 kg)   SpO2 96%   BMI 32.95 kg/m   BP Readings from Last 3 Encounters:  12/03/23 (!) 146/74  08/11/23 (!) 162/92  07/15/23 (!) 156/87    Wt Readings from Last 3 Encounters:  12/03/23 263 lb 9.6 oz (119.6 kg)  08/11/23 258 lb (117 kg)  07/15/23 255 lb (115.7 kg)  Physical Exam Vitals reviewed.  Constitutional:      Appearance: Normal appearance.  HENT:     Nose: Nose normal.     Mouth/Throat:     Mouth: Mucous membranes are moist.  Eyes:     General: No scleral icterus.    Conjunctiva/sclera: Conjunctivae normal.  Cardiovascular:     Rate and Rhythm: Normal rate and regular rhythm.     Heart sounds: No murmur heard.     No friction rub. No gallop.  Pulmonary:     Effort: Pulmonary effort is normal.     Breath sounds: No stridor. No wheezing, rhonchi or rales.  Abdominal:     General: Abdomen is flat.     Palpations: There is no mass.     Tenderness: There is no abdominal tenderness. There is no guarding.     Hernia: No hernia is present.  Musculoskeletal:        General: Normal range of motion.     Cervical back: Neck supple.     Right lower leg: No edema.     Left lower leg: No edema.  Lymphadenopathy:     Cervical: No cervical adenopathy.  Skin:    General: Skin is warm and dry.  Neurological:     General: No focal deficit present.     Mental Status: He is alert. Mental status is at baseline.  Psychiatric:        Mood and Affect: Mood normal.        Behavior: Behavior normal.     Lab Results  Component Value Date   WBC 7.0 12/03/2023   HGB 16.3 12/03/2023   HCT 48.1 12/03/2023   PLT 277.0 12/03/2023   GLUCOSE 119 (H) 12/03/2023   CHOL 133 08/11/2023   TRIG 91.0 08/11/2023   HDL 46.80 08/11/2023   LDLCALC 68 08/11/2023   ALT 29 08/11/2023   AST 22 08/11/2023   NA 140 12/03/2023   K 3.7 12/03/2023   CL 101 12/03/2023   CREATININE 1.23 12/03/2023   BUN 19 12/03/2023   CO2 29 12/03/2023   TSH 2.38 08/11/2023   PSA 4.04 (H) 12/03/2023   HGBA1C 5.8 08/11/2023    CT CARDIAC SCORING ([facility] LOCATIONS ONLY) Result Date: 08/28/2023 CLINICAL DATA:  68 year old African American male with family history of coronary artery disease. * Tracking Code: FCC * EXAM: CT CARDIAC CORONARY ARTERY CALCIUM SCORE TECHNIQUE: Non-contrast imaging through the heart was performed using prospective ECG gating. Image post processing was performed on an independent workstation, allowing for quantitative analysis of the heart and coronary arteries. Note that this exam targets the heart and the chest was not imaged in its entirety. COMPARISON:  No priors. FINDINGS: CORONARY CALCIUM SCORES: Left Main: 0 LAD: 11 LCx:  1 RCA/PDA: 6 Total Agatston Score: 19 MESA database percentile: 56th AORTA MEASUREMENTS: Ascending Aorta: 3.3 cm Descending Aorta:2.9 cm OTHER FINDINGS: Several small 2-4 mm pulmonary nodules are noted scattered in the lungs. Within the visualized portions of the thorax there are no other larger more suspicious appearing pulmonary nodules or masses, there is no acute consolidative airspace disease, no pleural effusions, no pneumothorax and no lymphadenopathy. Visualized portions of the upper abdomen are unremarkable. There are no aggressive appearing lytic or blastic lesions noted in the visualized portions of the skeleton. IMPRESSION: 1. Patient's total coronary artery calcium score is 19 which is 56th percentile for patient's of matched age, gender and race/ethnicity. Please note that although the presence of coronary artery calcium documents the  presence of coronary artery disease, the severity of this disease and any potential stenosis cannot be assessed on this noncontrast CT examination. Assessment for potential risk factor modification, dietary therapy or pharmacologic therapy may be warranted, if clinically indicated. 2. Small 2-4 mm pulmonary nodules in the lungs, nonspecific, but statistically likely benign. No follow-up needed if patient is low-risk (and has no known or suspected primary neoplasm). Non-contrast chest CT can be considered in 12 months if patient is high-risk. This recommendation follows the consensus statement: Guidelines for Management of Incidental Pulmonary Nodules Detected on CT Images: From the Fleischner Society 2017; Radiology 2017; 284:228-243. Electronically Signed   By: Trudie Reed M.D.   On: 08/28/2023 08:57    Assessment & Plan:   Rising PSA level- His PSA is down to 4.04. -     PSA; Future -     Urinalysis, Routine w reflex microscopic; Future  Gastroesophageal reflux disease, unspecified whether esophagitis present -     CBC with Differential/Platelet;  Future  Encounter for general adult medical examination with abnormal findings- Exam completed, labs reviewed, vaccines reviewed, cancer screenings are UTD, pt ed material was given.   Primary hypertension- He has not achieved his BP goal. Will add a CCB. -     Basic metabolic panel; Future -     CBC with Differential/Platelet; Future -     amLODIPine Besylate; Take 1 tablet (5 mg total) by mouth daily.  Dispense: 90 tablet; Refill: 1     Follow-up: Return in about 6 months (around 06/01/2024).  Sanda Linger, MD

## 2023-12-03 NOTE — Patient Instructions (Signed)
Health Maintenance, Male Adopting a healthy lifestyle and getting preventive care are important in promoting health and wellness. Ask your health care provider about: The right schedule for you to have regular tests and exams. Things you can do on your own to prevent diseases and keep yourself healthy. What should I know about diet, weight, and exercise? Eat a healthy diet  Eat a diet that includes plenty of vegetables, fruits, low-fat dairy products, and lean protein. Do not eat a lot of foods that are high in solid fats, added sugars, or sodium. Maintain a healthy weight Body mass index (BMI) is a measurement that can be used to identify possible weight problems. It estimates body fat based on height and weight. Your health care provider can help determine your BMI and help you achieve or maintain a healthy weight. Get regular exercise Get regular exercise. This is one of the most important things you can do for your health. Most adults should: Exercise for at least 150 minutes each week. The exercise should increase your heart rate and make you sweat (moderate-intensity exercise). Do strengthening exercises at least twice a week. This is in addition to the moderate-intensity exercise. Spend less time sitting. Even light physical activity can be beneficial. Watch cholesterol and blood lipids Have your blood tested for lipids and cholesterol at 66 years of age, then have this test every 5 years. You may need to have your cholesterol levels checked more often if: Your lipid or cholesterol levels are high. You are older than 66 years of age. You are at high risk for heart disease. What should I know about cancer screening? Many types of cancers can be detected early and may often be prevented. Depending on your health history and family history, you may need to have cancer screening at various ages. This may include screening for: Colorectal cancer. Prostate cancer. Skin cancer. Lung  cancer. What should I know about heart disease, diabetes, and high blood pressure? Blood pressure and heart disease High blood pressure causes heart disease and increases the risk of stroke. This is more likely to develop in people who have high blood pressure readings or are overweight. Talk with your health care provider about your target blood pressure readings. Have your blood pressure checked: Every 3-5 years if you are 18-39 years of age. Every year if you are 40 years old or older. If you are between the ages of 65 and 75 and are a current or former smoker, ask your health care provider if you should have a one-time screening for abdominal aortic aneurysm (AAA). Diabetes Have regular diabetes screenings. This checks your fasting blood sugar level. Have the screening done: Once every three years after age 45 if you are at a normal weight and have a low risk for diabetes. More often and at a younger age if you are overweight or have a high risk for diabetes. What should I know about preventing infection? Hepatitis B If you have a higher risk for hepatitis B, you should be screened for this virus. Talk with your health care provider to find out if you are at risk for hepatitis B infection. Hepatitis C Blood testing is recommended for: Everyone born from 1945 through 1965. Anyone with known risk factors for hepatitis C. Sexually transmitted infections (STIs) You should be screened each year for STIs, including gonorrhea and chlamydia, if: You are sexually active and are younger than 66 years of age. You are older than 66 years of age and your   health care provider tells you that you are at risk for this type of infection. Your sexual activity has changed since you were last screened, and you are at increased risk for chlamydia or gonorrhea. Ask your health care provider if you are at risk. Ask your health care provider about whether you are at high risk for HIV. Your health care provider  may recommend a prescription medicine to help prevent HIV infection. If you choose to take medicine to prevent HIV, you should first get tested for HIV. You should then be tested every 3 months for as long as you are taking the medicine. Follow these instructions at home: Alcohol use Do not drink alcohol if your health care provider tells you not to drink. If you drink alcohol: Limit how much you have to 0-2 drinks a day. Know how much alcohol is in your drink. In the [place] one drink equals one 12 oz bottle of beer (355 mL), one 5 oz glass of wine (148 mL), or one 1 oz glass of hard liquor (44 mL). Lifestyle Do not use any products that contain nicotine or tobacco. These products include cigarettes, chewing tobacco, and vaping devices, such as e-cigarettes. If you need help quitting, ask your health care provider. Do not use street drugs. Do not share needles. Ask your health care provider for help if you need support or information about quitting drugs. General instructions Schedule regular health, dental, and eye exams. Stay current with your vaccines. Tell your health care provider if: You often feel depressed. You have ever been abused or do not feel safe at home. Summary Adopting a healthy lifestyle and getting preventive care are important in promoting health and wellness. Follow your health care provider's instructions about healthy diet, exercising, and getting tested or screened for diseases. Follow your health care provider's instructions on monitoring your cholesterol and blood pressure. This information is not intended to replace advice given to you by your health care provider. Make sure you discuss any questions you have with your health care provider. Document Revised: 03/19/2021 Document Reviewed: 03/19/2021 Elsevier Patient Education  2024 [employer]  

## 2024-01-18 ENCOUNTER — Other Ambulatory Visit: Payer: Self-pay | Admitting: Internal Medicine

## 2024-01-18 DIAGNOSIS — E785 Hyperlipidemia, unspecified: Secondary | ICD-10-CM

## 2024-02-04 ENCOUNTER — Encounter: Payer: Self-pay | Admitting: Endocrinology

## 2024-02-04 ENCOUNTER — Ambulatory Visit: Admitting: Endocrinology

## 2024-02-04 VITALS — BP 122/72 | HR 85 | Ht 75.0 in | Wt 263.0 lb

## 2024-02-04 DIAGNOSIS — R972 Elevated prostate specific antigen [PSA]: Secondary | ICD-10-CM

## 2024-02-04 DIAGNOSIS — E230 Hypopituitarism: Secondary | ICD-10-CM | POA: Diagnosis not present

## 2024-02-04 NOTE — Progress Notes (Incomplete)
 Outpatient Endocrinology Note Dustin Vetra Shinall, MD   Patient's Name: Dustin Mckee    DOB: 19-Sep-1956    MRN: [id number]  REASON OF VISIT: Follow up for hypogonadism  PCP:  Etta Grandchild, MD  HISTORY OF PRESENT ILLNESS:   Dustin Mckee is a 68 y.o. old male with past medical history listed below, is here for follow up for hypogonadism.  Pertinent Hx: Patient was previously seen by Dr. Lucianne Muss and was last time seen in March 2024.  Patient was following for management of hypogonadism.  He was diagnosed with hypogonadism in 2012 by his previous primary care provider at that time had complaints of fatigue and lethargy.  No records of initial evaluation of hypogonadism is available to review.  He was treated with testosterone gel for several months in the past with improvement on symptoms, and then he stopped on his own.  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  He was treated with clomiphene since early of 2017.  He had testosterone level as low as 127 in the past without treatment.  He had baseline free testosterone level 19 with normal of more than 35.8. When he was on clomiphene 3 times a week testosterone level was up to 326.  With recurrence of symptoms later clomiphene was switched to testosterone gel 2 pumps daily and had improvement of the symptoms.  He had normalization of testosterone with AndroGel 1.62% 3 pumps daily.  Which was later changed to [facility] because of high hemoglobin of 17.9.    He ultimately stopped Natesto with concern of nasal congestion.  He had intermittent follow-up with endocrinology in the past.  He was last time seen in this clinic in March 2024.  Other : He has sleep apnea treated with CPAP. He has history of  prediabetes, on diet control and lifestyle modification, hemoglobin A1c was 5.8% in September 2024.  He was treated with Qsymia in the past.  Interval history Patient was last seen in this clinic in March 2024.  Patient had been on testosterone supplement with topical and nasal intermittently in the past.  Patient was planned to restart Natesto last year however he said he never restarted.  He has not been on testosterone supplement for about more than 1 to 2 years.  Patient complains of fatigue, low motivation, low libido and he wants to restart testosterone supplement and wants to consider other form of treatment other than nasal.  Total testosterone was 163 in February 2024.  With a  With review of chart, patient had mildly elevated PSA was 4.04 in January 2025 and was 4.45 in September 2024.   REVIEW OF SYSTEMS:  As per history of present illness.   PAST MEDICAL HISTORY: Past Medical History:  Diagnosis Date  . Allergy   . Hyperlipidemia   . Hypertension   . Prediabetes   . Sleep apnea    CPAP    PAST SURGICAL HISTORY: Past Surgical History:  Procedure Laterality Date  . APPENDECTOMY    . TOOTH EXTRACTION    . VASECTOMY      ALLERGIES: Allergies  Allergen Reactions  . Iodine Hives    FAMILY HISTORY:  Family History  Problem Relation Age of Onset  . Heart disease Mother   . Kidney disease Mother   .  Heart disease Father        defibrillator/CHF.  . Diabetes Father   . Heart disease Sister        CHF  . Diabetes Sister   . Heart disease Sister        CHF    SOCIAL HISTORY: Social History   Socioeconomic History  . Marital status: Married    Spouse name: [name]  . Number of children: Not on file  . Years of education: Not on file  . Highest education level: Bachelor's degree (e.g., BA, AB, BS)  Occupational History  . Not on file  Tobacco Use  . Smoking status: Never  . Smokeless tobacco: Never  Substance and Sexual Activity  . Alcohol use: No  .  Drug use: No  . Sexual activity: Yes    Partners: Female    Birth control/protection: None  Other Topics Concern  . Not on file  Social History Narrative   Drinks 1-2 cups caffeine daily.   Social Drivers of Corporate investment banker Strain: Low Risk  (08/11/2023)   Overall Financial Resource Strain (CARDIA)   . Difficulty of Paying Living Expenses: Not hard at all  Food Insecurity: No Food Insecurity (08/11/2023)   Hunger Vital Sign   . Worried About Programme researcher, broadcasting/film/video in the Last Year: Never true   . Ran Out of Food in the Last Year: Never true  Transportation Needs: No Transportation Needs (08/11/2023)   PRAPARE - Transportation   . Lack of Transportation (Medical): No   . Lack of Transportation (Non-Medical): No  Physical Activity: Insufficiently Active (08/11/2023)   Exercise Vital Sign   . Days of Exercise per Week: 2 days   . Minutes of Exercise per Session: 20 min  Stress: No Stress Concern Present (08/11/2023)   [employer] of Occupational Health - Occupational Stress Questionnaire   . Feeling of Stress : Not at all  Social Connections: Socially Integrated (08/11/2023)   Social Connection and Isolation Panel [NHANES]   . Frequency of Communication with Friends and Family: More than three times a week   . Frequency of Social Gatherings with Friends and Family: More than three times a week   . Attends Religious Services: More than 4 times per year   . Active Member of Clubs or Organizations: Yes   . Attends Banker Meetings: More than 4 times per year   . Marital Status: Married    MEDICATIONS:  Current Outpatient Medications  Medication Sig Dispense Refill  . amLODipine (NORVASC) 5 MG tablet Take 1 tablet (5 mg total) by mouth daily. 90 tablet 1  . Multiple Vitamins-Minerals (MENS 50+ ADVANCED PO) Take by mouth.    . rosuvastatin (CRESTOR) 20 MG tablet TAKE 1 TABLET BY MOUTH EVERY DAY 90 tablet 0  . triamterene-hydrochlorothiazide (DYAZIDE) 37.5-25  MG capsule Take 1 each (1 capsule total) by mouth daily. 90 capsule 0   No current facility-administered medications for this visit.    PHYSICAL EXAM: Vitals:   02/04/24 1052  BP: 122/72  Pulse: 85  SpO2: 95%  Weight: 263 lb (119.3 kg)  Height: 6\' 3"  (1.905 m)   Body mass index is 32.87 kg/m.  Wt Readings from Last 3 Encounters:  02/04/24 263 lb (119.3 kg)  12/03/23 263 lb 9.6 oz (119.6 kg)  08/11/23 258 lb (117 kg)    General: Well developed, well nourished male in no apparent distress. Non-Cushingoid. No obvious Klinefelter's syndrome body habitus HEENT: AT/[place], no external  lesions. Hearing intact to the spoken word Eyes: EOMI. No proptosis, stare and lid lag. Conjunctiva clear and no icterus. Visual field grossly intact in confrontation Neck: Trachea midline, neck supple without appreciable thyromegaly or lymphadenopathy and no palpable thyroid nodules Lungs: Clear to auscultation, no wheeze. Respirations not labored Heart: S1S2, Regular in rate and rhythm.  Abdomen: Soft, non tender, non distended Neurologic: Alert, oriented, normal speech, deep tendon biceps reflexes normal,  no gross focal neurological deficit. No obvious proximal weakness Extremities: No pedal pitting edema, no tremors of outstretched hands, no excessive hair growth Skin: Warm, color good. Psychiatric: Does not appear depressed or anxious.   PERTINENT HISTORIC LABORATORY AND IMAGING STUDIES:  All pertinent laboratory results were reviewed. Please see HPI also for further details.    Latest Reference Range & Units 10/24/22 14:19 08/11/23 16:03 12/03/23 15:04  PSA 0.10 - 4.00 ng/mL 3.57 4.45 (H) 4.04 (H)  (H): Data is abnormally high  ASSESSMENT / PLAN  1. Hypogonadotropic hypogonadism (HCC)   2. Elevated PSA    -Patient was intermittently following this clinic and was being managed for hypogonadotropic hypogonadism, intermittently with topical AndroGel testosterone supplement.  He has not been on  testosterone supplement for more than 1 to 2 years.  He has complaints of fatigue, low libido and wants to consider testosterone therapy for urgent nasal form. -Discussed that as he has been off of testosterone Therapy for several months I would like to reevaluate for hypogonadism.  No records of initial evaluation of hypogonadism may be available to review.  Mr. Larkey is a 68 y.o. old male with erectile dysfunction who was found to have low total testosterone levels in one occasion. To confirm the diagnosis of secondary hypogonadism we will check again his 8 AM total, bioavailable and free testosterone. We will also check his LH and FSH levels.   If we confirm the diagnosis of secondary hypogonadism we will proceed with measurements of 8 AM cortisol, free T4, and prolactin, and will obtain a sella MRI.   We discussed the benefits and side-effects of starting testosterone replacement.   Patient has hypogonadotropic hypogonadism. Differentials to exclude: hyperprolactinemia, hereditary hemochromatosis (fatigue, hypogonadism). Lab test suggestive of hereditary hemochromatosis: unexplained abnormal LFT, ferritin >300 in men, transferrin saturation >45% in men.   Based on endocrine society guidelines for Monitoring Men Receiving Testosterone Therapy:    -Before starting testosterone therapy, will get LFTs, lipid panel, hemoglobin and hematocrit (withhold initial treatment if hematocrit >48%).  -Will start patient on ***IM testosterone cypionate  - will Monitor Testosterone concentrations 3 months after initiation of T therapy:   Goal of therapy is raise serum T concentrations into the mid-normal range.    If mid-interval T is >600 ng/dL (16.1 nmol/L) or <096 ng/dL (04.5 nmol/L), will adjust dose or frequency.   -When he is at goal, will evaluate annually  to assess whether symptoms have responded to treatment and whether the patient is suffering from any adverse effects    - will check hematocrit at  baseline *** , 3-6 mo after starting treatment ***, and then annually ***. If hematocrit is >54%, will stop therapy until hematocrit decreases to a safe level; will also  evaluate the patient for hypoxia and sleep apnea; reinitiate therapy with a reduced dose.   - will Measure BMD of lumbar spine and/or femoral neck after 1-2 y of Testosterone therapy in hypogonadal men with osteoporosis -- I explained the potential benefits and risks of monitoring for prostate cancer and engage  the patient in shared decision making regarding the prostate monitoring plan.    Causes of abnormal SHBG include the following: ?Increased SHBG concentrations - Aging, hyperthyroidism, high estrogen concentrations, liver disease, HIV, antiseizure medications  ?Decreased SHBG concentrations - Moderate obesity, insulin resistance, type 2 diabetes, hypothyroidism, growth hormone excess, exogenous androgens/anabolic steroids, glucocorticoids, progestins, nephrotic syndrome      As discussed during your visit, testicular atrophy is commonly seen in men taking exogenous testosterone for many years. Additionally administration of testosterone exogenously suppresses LH secretion and thereby suppresses the high concentration of intratesticular testosterone essential for spermatogenesis.  For most men with secondary hypogonadism due to hypothalamic or pituitary disease who are seeking fertility, gonadotropin therapy is recommended. hCG is used to replace LH in these men who  desire to become fertile. During treatment, exogenous testosterone is stopped while patient is on hCG. Patient is then monitored for many months to see if there is improvement in endogenous testosterone and later sperm production.   - Start  Androgel/Testim 1% gel 50-100 mg/day (5g = 50mg  of testosterone)                         Wash the skin with soap and water before having skin-to-skin     contact.    Apply to clean, intact, dry skin of shoulders/upper  arms/abdomen (not to genitals)   Cover the application site with a shirt   Testosterone levels are maintained when the application site is    washed 4-6H after application  Androderm 2-6 mg /day   contact me if develop skin irritation as could use Triamcinolone    cream to prevent it.  Tesosterone Cypionate IM 100-200 mg every 2 weeks  Axiron 2% solution (30mg  per actuation) 30-120 mg/day   -Discussed the expectations: Libido improves markedly and immediately in 1-2 months. Energy and fat free mass within first 3-6 months. BMD increase may take up to 2 years. -Discussed importance of follow up PSA and H&H levels. DRE to be done yearly by PCP. -Needs screening for OSA if develops suggestive symptoms (e.g. snoring, non refreshing sleep etc) -Testosterone Blood levels to be checked  for IM: at midpoint between the 2 injections  for patch: apply at bedtime and check level in AM  for GEL: anytime  for Buccal: immediately before or after application of fresh system  for Pellets: at the end of the dosing interval -Goal level in the low/mid normal range.   Possible complications of testosterone supplementation include: Progression of prostate CA/increase in PSA Decreases in HDL Acne Oily skin Testicular atrophy Gynecomastia Polycythemia Sleep apnea  Likely benefits of testosterone supplementation include improvements in: Strength and muscle mass Sense of well-being Sexual activity/ libido Energy  Decreased overall body fat  Check Hb, PSA before the next visit, stop testosterone for any above mentioned side effects including urinary symptoms and notify me.   Labs: LH, FSH, Prolactin, TSH, Free/total, CBC, PSA, iron studies   Earl Lites "Tammy Sours" was seen today for follow-up.  Diagnoses and all orders for this visit:  Hypogonadotropic hypogonadism (HCC) -     Follicle stimulating hormone -     Luteinizing hormone -     Testosterone, Total, LC/MS/MS -     Testosterone, Free,  LC/MS/MS -     Prolactin  Elevated PSA -     PSA    DISPOSITION Follow up in clinic in *** months suggested.  All questions answered and patient verbalized understanding of the  plan.  Dustin Romelle Muldoon, MD [facility] Endocrinology [facility] [address] [place], [address] [place], [place] [address] Phone # [phone]  At least part of this note was generated using voice recognition software. Inadvertent word errors may have occurred, which were not recognized during the proofreading process.

## 2024-02-04 NOTE — Progress Notes (Addendum)
 Outpatient Endocrinology Note Dustin Phares Zaccone, MD   Patient's Name: Dustin Mckee    DOB: [birth year]    MRN: [id number]  REASON OF VISIT: Follow up for hypogonadism  PCP:  Dustin Grandchild, MD  HISTORY OF PRESENT ILLNESS:   Dustin Mckee is a 68 y.o. old male with past medical history listed below, is here for follow up for hypogonadism.  Pertinent Hx: Patient was previously seen by Dr. Lucianne Mckee and was last time seen in March 2024.  Patient was following for management of hypogonadism.  He was diagnosed with hypogonadism in 2012 by his previous primary care provider at that time had complaints of fatigue and lethargy.  No records of initial evaluation of hypogonadism is available to review.  He was treated with testosterone gel for several months in the past with improvement on symptoms, and then he stopped on his own.  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  He was treated with clomiphene since early of 2017.  He had testosterone level as low as 127 in the past without treatment.  He had baseline free testosterone level 19 with normal of more than 35.8. When he was on clomiphene 3 times a week testosterone level was up to 326.  With recurrence of symptoms later clomiphene was switched to testosterone gel 2 pumps daily and had improvement of the symptoms.  He had normalization of testosterone with AndroGel 1.62% 3 pumps daily.  Which was later changed to [facility] because of high hemoglobin of 17.9.    He ultimately stopped Natesto with concern of nasal congestion.  He had intermittent follow-up with endocrinology in the past.  He was last time seen in this clinic in March 2024.  Other : He has sleep apnea treated with CPAP. He has history of  prediabetes, on diet control and lifestyle modification, hemoglobin A1c was 5.8% in September 2024.  He was treated with Qsymia in the past.  Interval history Patient was last seen in this clinic in March 2024.  Patient had been on testosterone supplement with topical and nasal intermittently in the past.  Patient was planned to restart Natesto last year however he said he never restarted.  He has not been on testosterone supplement for about more than 1 to 2 years.  Patient complains of fatigue, low motivation, low libido and he wants to restart testosterone supplement and wants to consider other form of treatment other than nasal.  Total testosterone was 163 in February 2024.  With a  With review of chart, patient had mildly elevated PSA was 4.04 in January 2025 and was 4.45 in September 2024.   REVIEW OF SYSTEMS:  As per history of present illness.   PAST MEDICAL HISTORY: Past Medical History:  Diagnosis Date   Allergy    Hyperlipidemia    Hypertension    Prediabetes    Sleep apnea    CPAP    PAST SURGICAL HISTORY: Past Surgical History:  Procedure Laterality Date   APPENDECTOMY     TOOTH EXTRACTION     VASECTOMY      ALLERGIES: Allergies  Allergen Reactions   Iodine Hives    FAMILY HISTORY:  Family History  Problem Relation Age of Onset   Heart disease Mother    Kidney disease Mother  Heart disease Father        defibrillator/CHF.   Diabetes Father    Heart disease Sister        CHF   Diabetes Sister    Heart disease Sister        CHF    SOCIAL HISTORY: Social History   Socioeconomic History   Marital status: Married    Spouse name: Dustin Mckee   Number of children: Not on file   Years of education: Not on file   Highest education level: Bachelor's degree (e.g., BA, AB, BS)  Occupational History   Not on file  Tobacco Use   Smoking status: Never   Smokeless tobacco: Never  Substance and Sexual Activity   Alcohol use: No   Drug use: No   Sexual  activity: Yes    Partners: Female    Birth control/protection: None  Other Topics Concern   Not on file  Social History Narrative   Drinks 1-2 cups caffeine daily.   Social Drivers of Corporate investment banker Strain: Low Risk  (08/11/2023)   Overall Financial Resource Strain (CARDIA)    Difficulty of Paying Living Expenses: Not hard at all  Food Insecurity: No Food Insecurity (08/11/2023)   Hunger Vital Sign    Worried About Running Out of Food in the Last Year: Never true    Ran Out of Food in the Last Year: Never true  Transportation Needs: No Transportation Needs (08/11/2023)   PRAPARE - Administrator, Civil Service (Medical): No    Lack of Transportation (Non-Medical): No  Physical Activity: Insufficiently Active (08/11/2023)   Exercise Vital Sign    Days of Exercise per Week: 2 days    Minutes of Exercise per Session: 20 min  Stress: No Stress Concern Present (08/11/2023)   Dustin Mckee of Occupational Health - Occupational Stress Questionnaire    Feeling of Stress : Not at all  Social Connections: Socially Integrated (08/11/2023)   Social Connection and Isolation Panel [NHANES]    Frequency of Communication with Friends and Family: More than three times a week    Frequency of Social Gatherings with Friends and Family: More than three times a week    Attends Religious Services: More than 4 times per year    Active Member of [employer] or Organizations: Yes    Attends Engineer, structural: More than 4 times per year    Marital Status: Married    MEDICATIONS:  Current Outpatient Medications  Medication Sig Dispense Refill   amLODipine (NORVASC) 5 MG tablet Take 1 tablet (5 mg total) by mouth daily. 90 tablet 1   Multiple Vitamins-Minerals (MENS 50+ ADVANCED PO) Take by mouth.     rosuvastatin (CRESTOR) 20 MG tablet TAKE 1 TABLET BY MOUTH EVERY DAY 90 tablet 0   Testosterone Undecanoate (JATENZO) 158 MG CAPS Take 1 capsule (158 mg total) by mouth 2  (two) times daily with a meal. 60 capsule 5   triamterene-hydrochlorothiazide (DYAZIDE) 37.5-25 MG capsule TAKE 1 EACH (1 CAPSULE TOTAL) BY MOUTH DAILY. 90 capsule 0   triamterene-hydrochlorothiazide (DYAZIDE) 37.5-25 MG capsule Take 1 each (1 capsule total) by mouth daily. 90 capsule 0   No current facility-administered medications for this visit.    PHYSICAL EXAM: Vitals:   02/04/24 1052  BP: 122/72  Pulse: 85  SpO2: 95%  Weight: 263 lb (119.3 kg)  Height: 6\' 3"  (1.905 m)   Body mass index is 32.87 kg/m.  Wt Readings from Last 3  Encounters:  02/04/24 263 lb (119.3 kg)  12/03/23 263 lb 9.6 oz (119.6 kg)  08/11/23 258 lb (117 kg)    General: Well developed, well nourished male in no apparent distress. Non-Cushingoid. No obvious Klinefelter's syndrome body habitus HEENT: AT/[place], no external lesions. Hearing intact to the spoken word Eyes: EOMI. No proptosis, stare and lid lag. Conjunctiva clear and no icterus. Visual field grossly intact in confrontation Neck: Trachea midline, neck supple without appreciable thyromegaly or lymphadenopathy and no palpable thyroid nodules Lungs: Clear to auscultation, no wheeze. Respirations not labored Heart: S1S2, Regular in rate and rhythm.  Abdomen: Soft, non tender, non distended Neurologic: Alert, oriented, normal speech, deep tendon biceps reflexes normal,  no gross focal neurological deficit. No obvious proximal weakness Extremities: No pedal pitting edema, no tremors of outstretched hands, no excessive hair growth Skin: Warm, color good. Psychiatric: Does not appear depressed or anxious.   PERTINENT HISTORIC LABORATORY AND IMAGING STUDIES:  All pertinent laboratory results were reviewed. Please see HPI also for further details.    Latest Reference Range & Units 10/24/22 14:19 08/11/23 16:03 12/03/23 15:04  PSA 0.10 - 4.00 ng/mL 3.57 4.45 (H) 4.04 (H)  (H): Data is abnormally high  ASSESSMENT / PLAN  1. Hypogonadotropic hypogonadism  (HCC)   2. Elevated PSA    -Patient was intermittently following this clinic and was being managed for hypogonadotropic hypogonadism, intermittently with topical AndroGel testosterone supplement.  He has not been on testosterone supplement for more than 1 to 2 years.  He has complaints of fatigue, low libido and wants to consider testosterone therapy for urgent nasal form. -Discussed that as he has been off of testosterone Therapy for several months I would like to reevaluate for hypogonadism.  No records of initial evaluation of hypogonadism may be available to review. -Also discussed that possibility of low sex hormone binding globulin can appropriately measured total testosterone is low however will need to provider free testosterone level to determine hypogonadism.  Plan: -Check total, free testosterone along with gonadotropin LH, FSH and prolactin. -He had recent mildly elevated PSAs I would like to recheck PSA level. -If he continued to have elevated PSA we will consider referral to urology. -With the laboratory results if suggestive of hypogonadism we will plan for injectable or oral testosterone supplement.  He had polycythemia with topical testosterone in the past.  He does not like to be on nasal testosterone had concern of nasal congestion in the past. -Patient will complete lab in early morning and fasting.   Ardeen Jourdain" was seen today for follow-up.  Diagnoses and all orders for this visit:  Hypogonadotropic hypogonadism (HCC) -     Follicle stimulating hormone -     Luteinizing hormone -     Testosterone, Total, LC/MS/MS -     Testosterone, Free, LC/MS/MS -     Prolactin -     Sex hormone binding globulin -     Testosterone Undecanoate (JATENZO) 158 MG CAPS; Take 1 capsule (158 mg total) by mouth 2 (two) times daily with a meal.  Elevated PSA -     PSA  Other orders -     Sex hormone binding globulin -     Testosterone, free   Labs reviewed he continued to have  low total and free testosterone's.  Normal gonadotropins.  PSA has improved and normal.  He is known to have hypogonadotropic hypogonadism.  Will plan for testosterone therapy.  Start Jatenzo 158 mg 2 times a day with meals.  Sent prescription to [facility], if not covered will plan for injectable testosterone.    Latest Reference Range & Units 02/06/24 08:18  LH 1.6 - 15.2 mIU/mL 4.2  FSH 1.4 - 12.8 mIU/mL 5.5  Prolactin 2.0 - 18.0 ng/mL 5.0  Sex Horm Binding Glob, Serum 22 - 77 nmol/L 17 (L)  Testosterone, Total, LC-MS-MS 250 - 1,100 ng/dL 191 (L)  Testosterone Free 46.0 - 224.0 pg/mL 30.8 (L)  PSA < OR = 4.00 ng/mL 3.46  (L): Data is abnormally low  DISPOSITION Follow up in clinic in 3 months suggested.  All questions answered and patient verbalized understanding of the plan.  Dustin Abreanna Drawdy, MD [facility] [address] [place], [address] [place], [place] [address] Phone # [phone]  At least part of this note was generated using voice recognition software. Inadvertent word errors may have occurred, which were not recognized during the proofreading process.

## 2024-02-06 ENCOUNTER — Other Ambulatory Visit

## 2024-02-06 ENCOUNTER — Other Ambulatory Visit: Payer: Self-pay | Admitting: Internal Medicine

## 2024-02-06 DIAGNOSIS — I10 Essential (primary) hypertension: Secondary | ICD-10-CM

## 2024-02-06 DIAGNOSIS — I119 Hypertensive heart disease without heart failure: Secondary | ICD-10-CM

## 2024-02-09 MED ORDER — TRIAMTERENE-HCTZ 37.5-25 MG PO CAPS: 1.0000 | ORAL_CAPSULE | Freq: Every day | ORAL | 0 refills | Status: DC

## 2024-02-10 LAB — TESTOSTERONE, FREE: TESTOSTERONE FREE: 30.8 pg/mL — ABNORMAL LOW (ref 46.0–224.0)

## 2024-02-10 LAB — LUTEINIZING HORMONE: LH: 4.2 m[IU]/mL (ref 1.6–15.2)

## 2024-02-10 LAB — SEX HORMONE BINDING GLOBULIN: Sex Hormone Binding: 17 nmol/L — ABNORMAL LOW (ref 22–77)

## 2024-02-10 LAB — PSA: PSA: 3.46 ng/mL (ref <=4.00)

## 2024-02-10 LAB — PROLACTIN: Prolactin: 5 ng/mL (ref 2.0–18.0)

## 2024-02-10 LAB — TESTOSTERONE, TOTAL, LC/MS/MS: Testosterone, Total, LC-MS-MS: 164 ng/dL — ABNORMAL LOW (ref 250–1100)

## 2024-02-10 LAB — FOLLICLE STIMULATING HORMONE: FSH: 5.5 m[IU]/mL (ref 1.4–12.8)

## 2024-02-12 ENCOUNTER — Telehealth: Payer: Self-pay

## 2024-02-12 ENCOUNTER — Encounter: Payer: Self-pay | Admitting: Endocrinology

## 2024-02-12 DIAGNOSIS — Z6832 Body mass index (BMI) 32.0-32.9, adult: Secondary | ICD-10-CM

## 2024-02-12 DIAGNOSIS — E66811 Obesity, class 1: Secondary | ICD-10-CM

## 2024-02-12 MED ORDER — JATENZO 158 MG PO CAPS: 158.0000 mg | ORAL_CAPSULE | Freq: Two times a day (BID) | ORAL | 5 refills | Status: DC

## 2024-02-12 NOTE — Telephone Encounter (Signed)
 Patient given results and medication changes as directed by MD.

## 2024-02-12 NOTE — Addendum Note (Signed)
 Addended by: Jahmal Dunavant, [place] on: 02/12/2024 03:42 PM   Modules accepted: Orders

## 2024-02-12 NOTE — Telephone Encounter (Signed)
-----   Message from [place] Thapa sent at 02/12/2024  3:43 PM EDT ----- Please notify patient of Labs reviewed he continued to have low total and free testosterone's.  Normal gonadotropins.  PSA has improved and normal.  He is known to have hypogonadotropic hypogonadism.  Will plan for testosterone therapy.  Start oral Jatenzo 158 mg 2 times a day with meals.  Sent prescription to [facility], if not covered will plan for injectable testosterone.

## 2024-02-13 ENCOUNTER — Telehealth: Payer: Self-pay

## 2024-02-13 DIAGNOSIS — E230 Hypopituitarism: Secondary | ICD-10-CM

## 2024-02-13 MED ORDER — JATENZO 158 MG PO CAPS: 158.0000 mg | ORAL_CAPSULE | Freq: Two times a day (BID) | ORAL | 5 refills | Status: DC

## 2024-02-13 NOTE — Addendum Note (Signed)
 Addended by: Henretta Quist, [place] on: 02/13/2024 10:51 AM   Modules accepted: Orders

## 2024-02-13 NOTE — Telephone Encounter (Signed)
 Sent to [facility] pharmacy as well.

## 2024-02-13 NOTE — Telephone Encounter (Signed)
 Patient request medication be [facility] to [facility] on [address]. MD aware

## 2024-02-16 ENCOUNTER — Other Ambulatory Visit: Payer: Self-pay

## 2024-02-16 MED ORDER — QSYMIA 7.5-46 MG PO CP24: ORAL_CAPSULE | ORAL | 4 refills | Status: DC

## 2024-02-16 MED ORDER — QSYMIA 3.75-23 MG PO CP24
ORAL_CAPSULE | ORAL | 0 refills | Status: DC
Start: 2024-02-16 — End: 2024-07-14

## 2024-02-16 NOTE — Telephone Encounter (Signed)
 Sent prescription for Qsymia start 3.75/23 mg 1 tablet daily for 14 days and increase to 7.5/46 mg daily.

## 2024-03-01 ENCOUNTER — Telehealth: Payer: Self-pay

## 2024-03-01 NOTE — Telephone Encounter (Signed)
 Pharmacy Patient Advocate Encounter   Received notification from CoverMyMeds that prior authorization for Qsymia  3.75-23MG  er capsules is required/requested.   Insurance verification completed.   The patient is insured through [facility] .   Per test claim: PA required; PA submitted to above mentioned insurance via CoverMyMeds Key/confirmation #/EOC [id number] Status is pending

## 2024-03-04 ENCOUNTER — Other Ambulatory Visit (HOSPITAL_COMMUNITY): Payer: Self-pay

## 2024-03-04 NOTE — Telephone Encounter (Signed)
 PA APPROVED through 05/30/24

## 2024-04-13 ENCOUNTER — Other Ambulatory Visit: Payer: Self-pay | Admitting: Internal Medicine

## 2024-04-14 ENCOUNTER — Telehealth: Payer: Self-pay | Admitting: Internal Medicine

## 2024-04-19 ENCOUNTER — Ambulatory Visit: Admitting: Family Medicine

## 2024-04-19 ENCOUNTER — Encounter: Payer: Self-pay | Admitting: Family Medicine

## 2024-04-19 ENCOUNTER — Ambulatory Visit
Admission: RE | Admit: 2024-04-19 | Discharge: 2024-04-19 | Disposition: A | Discharge status: Home / Self Care | Source: Ambulatory Visit | Attending: Family Medicine | Admitting: Family Medicine

## 2024-04-19 VITALS — BP 157/89 | Ht 75.0 in | Wt 263.0 lb

## 2024-04-19 DIAGNOSIS — I10 Essential (primary) hypertension: Secondary | ICD-10-CM | POA: Diagnosis not present

## 2024-04-19 DIAGNOSIS — M5412 Radiculopathy, cervical region: Secondary | ICD-10-CM

## 2024-04-19 MED ORDER — PREDNISONE 10 MG PO TABS: ORAL_TABLET | ORAL | 0 refills | Status: DC

## 2024-04-19 NOTE — Assessment & Plan Note (Signed)
 Patient with elevated blood pressure today on presentation and recheck  Plan: - Should continue his amlodipine  and Dyazide as prescribed - Follow-up with PCP as scheduled

## 2024-04-19 NOTE — Progress Notes (Signed)
 DATE OF VISIT: 04/19/2024        Kaylee Partridge DOB: [birth year] MRN: [id number]  CC:  Rt arm pain  History- OREE HISLOP is a 68 y.o. RT-hand dominant male for evaluation and treatment of Rt arm pain Patient has been complaining of right arm pain over the last week Symptoms radiating from the right neck/shoulder into the right hand and thumb Denies any specific injury or trauma, does note he was buffing scratches off of his daughter's car the weekend prior to this starting Has been experiencing increasing night pain and difficulty sleeping Has been taking Aleve 2 tabs twice daily with limited improvement Has been using Unisom at bedtime to help with sleep Reports prior history of left-sided radiculopathy, but no right-sided symptoms in the past No recent imaging Denies any upper extremity weakness Some associated numbness that radiates into his right thumb  Job: As a Emergency planning/management officer, mainly computer work   Past Medical History Past Medical History:  Diagnosis Date   Allergy    Hyperlipidemia    Hypertension    Prediabetes    Sleep apnea    CPAP    Past Surgical History Past Surgical History:  Procedure Laterality Date   APPENDECTOMY     TOOTH EXTRACTION     VASECTOMY      Medications Current Outpatient Medications  Medication Sig Dispense Refill   predniSONE  (DELTASONE ) 10 MG tablet Take as directed per MD instructions 21 tablet 0   amLODipine  (NORVASC ) 5 MG tablet Take 1 tablet (5 mg total) by mouth daily. 90 tablet 1   Multiple Vitamins-Minerals (MENS 50+ ADVANCED PO) Take by mouth.     Phentermine-Topiramate (QSYMIA ) 3.75-23 MG CP24 Take 1 tablet daily for 14 days, then increase to dose of 7.5 mg / 46 mg daily. 14 capsule 0   Phentermine-Topiramate (QSYMIA ) 7.5-46 MG CP24 Take 1 tablet daily, start after completion of 14 days of 3.75 mg /23 mg dose 30 capsule 4   rosuvastatin  (CRESTOR ) 20 MG tablet TAKE 1 TABLET BY MOUTH EVERY DAY 90 tablet 0   Testosterone   Undecanoate (JATENZO ) 158 MG CAPS Take 1 capsule (158 mg total) by mouth 2 (two) times daily with a meal. 60 capsule 5   triamterene -hydrochlorothiazide  (DYAZIDE) 37.5-25 MG capsule TAKE 1 EACH (1 CAPSULE TOTAL) BY MOUTH DAILY. 90 capsule 0   triamterene -hydrochlorothiazide  (DYAZIDE) 37.5-25 MG capsule Take 1 each (1 capsule total) by mouth daily. 90 capsule 0   No current facility-administered medications for this visit.    Allergies is allergic to iodine.  Family History - reviewed per EMR and intake form  Social History   reports no history of alcohol use.  reports that he has never smoked. He has never used smokeless tobacco.  reports no history of drug use. OCCUPATION: Emergency planning/management officer, mainly computer work   EXAM: Vitals: BP (!) 157/89   Ht 6\' 3"  (1.905 m)   Wt 263 lb (119.3 kg)   BMI 32.87 kg/m  General: AOx3, NAD, pleasant SKIN: no rashes or lesions, skin clean, dry, intact MSK: C-spine: Full range of motion without pain.  No midline or paraspinal tenderness.  Negative Spurling's bilaterally.  Normal upper extremity strength 5/5 bilaterally.  Normal grip strength bilaterally.  NEURO: sensation intact to light touch, DTR +2/4 bicep, tricep, brachioradialis bilaterally VASC: pulses 2+ and symmetric radial artery bilaterally, no edema   Assessment & Plan Right cervical radiculopathy Acute right-sided radiculopathy along C6 distribution, likely related to bumping his daughter's car.  Does  have history of prior left-sided radiculopathy  Plan: - Diagnosis and treatment discussed - Rx 6-day prednisone  taper to take as directed.  Should take with food.  Should not take with any other NSAIDs - Heating pad as needed - Imaging: C-spine x-rays rule out bony abnormality - Follow-up 2 weeks for reevaluation, if no improvement consider physical therapy versus advanced imaging with MRI Primary hypertension Patient with elevated blood pressure today on presentation and  recheck  Plan: - Should continue his amlodipine  and Dyazide as prescribed - Follow-up with PCP as scheduled  Patient expressed understanding & agreement with above.  Encounter Diagnoses  Name Primary?   Right cervical radiculopathy Yes   Primary hypertension     Orders Placed This Encounter  Procedures   DG Cervical Spine 2 or 3 views    Orders Placed This Encounter  Procedures   DG Cervical Spine 2 or 3 views

## 2024-04-24 ENCOUNTER — Other Ambulatory Visit: Payer: Self-pay | Admitting: Internal Medicine

## 2024-04-24 DIAGNOSIS — E785 Hyperlipidemia, unspecified: Secondary | ICD-10-CM

## 2024-04-26 ENCOUNTER — Encounter: Payer: Self-pay | Admitting: Family Medicine

## 2024-04-26 ENCOUNTER — Ambulatory Visit: Payer: Self-pay | Admitting: Family Medicine

## 2024-05-03 ENCOUNTER — Ambulatory Visit: Admitting: Family Medicine

## 2024-05-03 ENCOUNTER — Encounter: Payer: Self-pay | Admitting: Family Medicine

## 2024-05-03 VITALS — BP 128/84 | Ht 75.0 in | Wt 255.0 lb

## 2024-05-03 DIAGNOSIS — M5412 Radiculopathy, cervical region: Secondary | ICD-10-CM

## 2024-05-03 DIAGNOSIS — M47812 Spondylosis without myelopathy or radiculopathy, cervical region: Secondary | ICD-10-CM

## 2024-05-03 MED ORDER — MELOXICAM 15 MG PO TABS: 15.0000 mg | ORAL_TABLET | Freq: Every day | ORAL | 1 refills | Status: DC

## 2024-05-03 NOTE — Progress Notes (Signed)
    SUBJECTIVE:   CHIEF COMPLAINT / HPI:   GM is a 68yo M pf R cervical radiculopathy f/u. - Seen 6/9 for R cervical radiculopathy, was given 6day pred taper.  - Since then, he reports his symptoms are overall improving. He is no longer having neck/shoulder pain, but he still has lingering soreness/pain in his triceps area. He also has a numb/tingling sensation that radiates down the dorsum of his forearm into the palmar aspect of his thumb. - He has been taking Aleve as needed for the pain - He has never had steroid injections in the past  DG Cervical Spine 6/9 - Straightening of the cervical spine with mild to moderate degenerative changes, most pronounced at C5-C6 and C6-C7.  PERTINENT  PMH / PSH: Hypertension, OSA, L sided cervical radiculopathy  OBJECTIVE:   BP 128/84   Ht 6' 3 (1.905 m)   Wt 255 lb (115.7 kg)   BMI 31.87 kg/m   General: Alert, pleasant man. NAD. HEENT: NCAT. MMM. Resp:Normal WOB on RA.  Skin: Warm, well perfused  Msk: 5 out of 5 symmetric strength in bilateral upper extremities.  Sensation intact bilaterally.  Tenderness to palpation of right triceps distribution.  No tenderness of rotator cuff, cervical spine, biceps.   ASSESSMENT/PLAN:   Assessment & Plan Right cervical radiculopathy Improving but still symptomatic.  Imaging consistent with degenerative changes of C-spine and distribution of symptoms consistent with C6.  Given persistence of symptoms, will refer to PT -Start meloxicam 15 mg daily for 7 days, then as needed -Referral to PT -Consider MRI if persistent   Twyla Nearing, MD [facility]

## 2024-05-05 ENCOUNTER — Ambulatory Visit: Admitting: Endocrinology

## 2024-05-05 ENCOUNTER — Encounter: Payer: Self-pay | Admitting: Endocrinology

## 2024-05-05 VITALS — BP 140/82 | HR 84 | Resp 16 | Ht 75.0 in | Wt 267.6 lb

## 2024-05-05 DIAGNOSIS — E230 Hypopituitarism: Secondary | ICD-10-CM

## 2024-05-05 DIAGNOSIS — E66811 Obesity, class 1: Secondary | ICD-10-CM | POA: Diagnosis not present

## 2024-05-05 NOTE — Progress Notes (Signed)
 Outpatient Endocrinology Note Dustin Keonta Alsip, MD   Patient's Name: Dustin Mckee    DOB: May 03, 1956    MRN: [id number]  REASON OF VISIT: Follow up for hypogonadism  PCP:  Dustin Debby CROME, MD  HISTORY OF PRESENT ILLNESS:   Dustin Mckee is a 68 y.o. old male with past medical history listed below, is here for follow up for hypogonadism.  Pertinent Hx: Patient was previously seen by Dr. Von and was last time seen in March 2024.  Patient was following for management of hypogonadism.  He was diagnosed with hypogonadism in 2012 by his previous primary care provider at that time had complaints of fatigue and lethargy.  No records of initial evaluation of hypogonadism is available to review.  He was treated with testosterone  gel for several months in the past with improvement on symptoms, and then he stopped on his own.  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  He was treated with clomiphene  since early of 2017.  He had testosterone  level as low as 127 in the past without treatment.  He had baseline free testosterone  level 19 with normal of more than 35.8. When he was on clomiphene  3 times a week testosterone  level was up to 326.  With recurrence of symptoms later clomiphene  was switched to testosterone  gel 2 pumps daily and had improvement of the symptoms.  He had normalization of testosterone  with AndroGel  1.62% 3 pumps daily.  Which was later changed to Natesto  because of high hemoglobin of 17.9.    He ultimately stopped Natesto  with concern of nasal congestion.  He had intermittent follow-up with endocrinology in the past.   Visit in March 2025 : He had significantly low testosterone  including total testosterone  of 164, testosterone  low 20.8  with normal gonadotropins, consistent with hypogonadotropic hypogonadism.  Started on Jatenzo  158 mg 2 times a day.  Obesity : BMI 33 He has history of prediabetes, obesity BMI 33 on diet control and lifestyle modification, hemoglobin A1c was 5.8% in September 2024.  He was treated with Qsymia  in the past.  Restarted on Qsymia  3.75/20 mg 1 tablet daily for 14 days and increase to 7.5/46 mg daily, beginning February 2025.  Other : He has sleep apnea treated with CPAP.  Interval history Patient has been on Jatenzo , reports feeling better and increased energy.  Reports compliance with taking twice a day.  He took Qsymia  for few weeks, currently not taking for more than a month.  He felt better and was able to lose weight when on Qsymia .  No other complaints today.   REVIEW OF SYSTEMS:  As per history of present illness.   PAST MEDICAL HISTORY: Past Medical History:  Diagnosis Date   Allergy    Hyperlipidemia    Hypertension    Prediabetes    Sleep apnea    CPAP    PAST SURGICAL HISTORY: Past Surgical History:  Procedure Laterality Date   APPENDECTOMY     TOOTH EXTRACTION     VASECTOMY      ALLERGIES: Allergies  Allergen Reactions   Iodine Hives    FAMILY HISTORY:  Family History  Problem Relation Age of Onset   Heart disease Mother    Kidney disease Mother    Heart disease Father  defibrillator/CHF.   Diabetes Father    Heart disease Sister        CHF   Diabetes Sister    Heart disease Sister        CHF    SOCIAL HISTORY: Social History   Socioeconomic History   Marital status: Married    Spouse name: Barnie   Number of children: Not on file   Years of education: Not on file   Highest education level: Bachelor's degree (e.g., BA, AB, BS)  Occupational History   Not on file  Tobacco Use   Smoking status: Never   Smokeless tobacco: Never  Substance and Sexual Activity   Alcohol use: No   Drug use: No   Sexual activity: Yes    Partners:  Female    Birth control/protection: None  Other Topics Concern   Not on file  Social History Narrative   Drinks 1-2 cups caffeine daily.   Social Drivers of Corporate investment banker Strain: Low Risk  (08/11/2023)   Overall Financial Resource Strain (CARDIA)    Difficulty of Paying Living Expenses: Not hard at all  Food Insecurity: No Food Insecurity (08/11/2023)   Hunger Vital Sign    Worried About Running Out of Food in the Last Year: Never true    Ran Out of Food in the Last Year: Never true  Transportation Needs: No Transportation Needs (08/11/2023)   PRAPARE - Administrator, Civil Service (Medical): No    Lack of Transportation (Non-Medical): No  Physical Activity: Insufficiently Active (08/11/2023)   Exercise Vital Sign    Days of Exercise per Week: 2 days    Minutes of Exercise per Session: 20 min  Stress: No Stress Concern Present (08/11/2023)   [employer] of Occupational Health - Occupational Stress Questionnaire    Feeling of Stress : Not at all  Social Connections: Socially Integrated (08/11/2023)   Social Connection and Isolation Panel    Frequency of Communication with Friends and Family: More than three times a week    Frequency of Social Gatherings with Friends and Family: More than three times a week    Attends Religious Services: More than 4 times per year    Active Member of [employer] or Organizations: Yes    Attends Engineer, structural: More than 4 times per year    Marital Status: Married    MEDICATIONS:  Current Outpatient Medications  Medication Sig Dispense Refill   amLODipine  (NORVASC ) 5 MG tablet Take 1 tablet (5 mg total) by mouth daily. 90 tablet 1   meloxicam (MOBIC) 15 MG tablet Take 1 tablet (15 mg total) by mouth daily. 30 tablet 1   Multiple Vitamins-Minerals (MENS 50+ ADVANCED PO) Take by mouth.     Phentermine-Topiramate (QSYMIA ) 7.5-46 MG CP24 Take 1 tablet daily, start after completion of 14 days of 3.75 mg /23 mg  dose 30 capsule 4   predniSONE  (DELTASONE ) 10 MG tablet Take as directed per MD instructions 21 tablet 0   rosuvastatin  (CRESTOR ) 20 MG tablet TAKE 1 TABLET BY MOUTH EVERY DAY 90 tablet 0   Testosterone  Undecanoate (JATENZO ) 158 MG CAPS Take 1 capsule (158 mg total) by mouth 2 (two) times daily with a meal. 60 capsule 5   triamterene -hydrochlorothiazide  (DYAZIDE) 37.5-25 MG capsule TAKE 1 EACH (1 CAPSULE TOTAL) BY MOUTH DAILY. 90 capsule 0   triamterene -hydrochlorothiazide  (DYAZIDE) 37.5-25 MG capsule Take 1 each (1 capsule total) by mouth daily. 90 capsule 0   Phentermine-Topiramate (QSYMIA )  3.75-23 MG CP24 Take 1 tablet daily for 14 days, then increase to dose of 7.5 mg / 46 mg daily. (Patient not taking: Reported on 05/05/2024) 14 capsule 0   No current facility-administered medications for this visit.    PHYSICAL EXAM: Vitals:   05/05/24 0824 05/05/24 0825  BP: (!) 152/82 (!) 140/82  Pulse: 84   Resp: 16   SpO2: 94%   Weight: 267 lb 9.6 oz (121.4 kg)   Height: 6' 3 (1.905 m)     Body mass index is 33.45 kg/m.  Wt Readings from Last 3 Encounters:  05/05/24 267 lb 9.6 oz (121.4 kg)  05/03/24 255 lb (115.7 kg)  04/19/24 263 lb (119.3 kg)    General: Well developed, well nourished male in no apparent distress. Non-Cushingoid. No obvious Klinefelter's syndrome body habitus HEENT: AT/[place], no external lesions. Hearing intact to the spoken word Eyes: EOMI. No proptosis, stare and lid lag. Conjunctiva clear and no icterus. Visual field grossly intact in confrontation Neck: Trachea midline, neck supple without appreciable thyromegaly or lymphadenopathy and no palpable thyroid  nodules Lungs: Clear to auscultation, no wheeze. Respirations not labored Heart: S1S2, Regular in rate and rhythm.  Abdomen: Soft, non tender, non distended Neurologic: Alert, oriented, normal speech, deep tendon biceps reflexes normal,  no gross focal neurological deficit. No obvious proximal weakness Extremities:  No pedal pitting edema, no tremors of outstretched hands, no excessive hair growth Skin: Warm, color good. Psychiatric: Does not appear depressed or anxious.   PERTINENT HISTORIC LABORATORY AND IMAGING STUDIES:  All pertinent laboratory results were reviewed. Please see HPI also for further details.    Latest Reference Range & Units 10/24/22 14:19 08/11/23 16:03 12/03/23 15:04  PSA 0.10 - 4.00 ng/mL 3.57 4.45 (H) 4.04 (H)  (H): Data is abnormally high  ASSESSMENT / PLAN  1. Hypogonadotropic hypogonadism (HCC)   2. Obesity (BMI 30.0-34.9)    - Patient has hypogonadotropic hypogonadism.  He was following intermittently in this clinic.  He was on and off of testosterone  therapy.  Reevaluation in March 2025 he had low total and free testosterone  with inappropriately normal gonadotropins consistent with hypogonadotropic hypogonadism.  - Jatenzo  testosterone  therapy was started in March 2025.  He had been on topical and nasal testosterone , and clomiphene  in the past.  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  -He has been on Qsymia  for weight management.  Plan: -Patient currently on Jatenzo  158 mg 2 times a day. -Will check total and free testosterone  today, and adjust her dose as needed. - Restart Qsymia  7.5/46 mg 1 tablet daily.  He has been off of it for more than a month.    Diagnoses and all orders for this visit:  Hypogonadotropic hypogonadism (HCC) -     Testosterone  , Free and Total  Obesity (BMI 30.0-34.9)    DISPOSITION Follow up in clinic in 4-5 months suggested.  All questions answered and patient verbalized understanding of the plan.  Dustin Esmeralda Malay, MD [facility] Endocrinology [facility] [address] [place], [address] [place], [place] [address] Phone # [phone]  At least part of this note was generated using voice recognition software. Inadvertent word errors may have occurred, which were not recognized during the  proofreading process.

## 2024-05-05 NOTE — Telephone Encounter (Signed)
 done

## 2024-05-09 ENCOUNTER — Other Ambulatory Visit: Payer: Self-pay | Admitting: Internal Medicine

## 2024-05-09 DIAGNOSIS — I119 Hypertensive heart disease without heart failure: Secondary | ICD-10-CM

## 2024-05-09 DIAGNOSIS — I10 Essential (primary) hypertension: Secondary | ICD-10-CM

## 2024-05-09 LAB — TESTOSTERONE, FREE & TOTAL
Free Testosterone: 89.3 pg/mL (ref 35.0–155.0)
Testosterone, Total, LC-MS-MS: 380 ng/dL (ref 250–1100)

## 2024-05-10 ENCOUNTER — Ambulatory Visit: Payer: Self-pay | Admitting: Endocrinology

## 2024-05-25 ENCOUNTER — Ambulatory Visit: Admitting: Physical Therapy

## 2024-05-26 ENCOUNTER — Other Ambulatory Visit: Payer: Self-pay | Admitting: Internal Medicine

## 2024-05-26 DIAGNOSIS — I10 Essential (primary) hypertension: Secondary | ICD-10-CM

## 2024-05-27 NOTE — Therapy (Signed)
 OUTPATIENT PHYSICAL THERAPY CERVICAL EVALUATION   Patient Name: Dustin Mckee MRN: [id number] DOB:03-26-1956, 68 y.o., male Today's Date: 05/28/2024  END OF SESSION:  PT End of Session - 05/28/24 1020     Visit Number 1    Authorization Type [employer]    PT Start Time 1020    PT Stop Time 1100    PT Time Calculation (min) 40 min          Past Medical History:  Diagnosis Date   Allergy    Hyperlipidemia    Hypertension    Prediabetes    Sleep apnea    CPAP   Past Surgical History:  Procedure Laterality Date   APPENDECTOMY     TOOTH EXTRACTION     VASECTOMY     Patient Active Problem List   Diagnosis Date Noted   Prediabetes 08/12/2023   Dyslipidemia, goal LDL below 100 08/12/2023   Rising PSA level 08/12/2023   Class 1 obesity due to excess calories without serious comorbidity with body mass index (BMI) of 33.0 to 33.9 in adult 03/06/2023   Primary hypertension 10/24/2022   Encounter for general adult medical examination with abnormal findings 10/24/2022   Class 1 obesity due to excess calories with serious comorbidity and body mass index (BMI) of 34.0 to 34.9 in adult 10/24/2022   Plantar fasciitis of right foot 05/11/2021   Obstructive sleep apnea treated with continuous positive airway pressure (CPAP) 01/13/2018   Excessive daytime sleepiness 10/15/2017   Seasonal allergic rhinitis due to pollen 10/15/2017   Vitamin D  deficiency 06/14/2014   Benign prostatic hyperplasia with urinary frequency 06/14/2014   GERD 01/23/2009   Sleep apnea 11/29/2008    PCP: Debby Molt  REFERRING PROVIDER: Rainell Cedar   REFERRING DIAG:  M54.12 (ICD-10-CM) - Right cervical radiculopathy  M47.812 (ICD-10-CM) - Cervical spondylosis without myelopathy    THERAPY DIAG:  No diagnosis found.  Rationale for Evaluation and Treatment: Rehabilitation  ONSET DATE: about 2 months ago   SUBJECTIVE:                                                                                                                                                                                                          SUBJECTIVE STATEMENT: Things are pretty okay. I don't know if it is really the neck anymore. The problem in mainly in the R upper arm. Sometimes I have numbness in my arm and in my thumb. It started with repetitive motion to rub paint off the car on the fender.    PERTINENT HISTORY:  GM is a 68yo M pf R cervical radiculopathy f/u. - Seen 6/9 for R cervical radiculopathy, was given 6day pred taper.  - Since then, he reports his symptoms are overall improving. He is no longer having neck/shoulder pain, but he still has lingering soreness/pain in his triceps area. He also has a numb/tingling sensation that radiates down the dorsum of his forearm into the palmar aspect of his thumb. - He has been taking Aleve as needed for the pain - He has never had steroid injections in the past  PAIN:  Are you having pain? Yes: NPRS scale: 5/10 Pain location: R upper arm Pain description: it has gotten better since it initially started, pulling pain in the arm  Aggravating factors: nothing specifically  Relieving factors: Melodic, ice  PRECAUTIONS: None  RED FLAGS: None     WEIGHT BEARING RESTRICTIONS: No  FALLS:  Has patient fallen in last 6 months? No  LIVING ENVIRONMENT: Lives with: lives with their family Lives in: House/apartment  OCCUPATION: works at a computer  PLOF: Independent  PATIENT GOALS: be able make the pain go away so I can lift and do work around the house that requires lifting and reaching   NEXT MD VISIT: 06/03/24  OBJECTIVE:  Note: Objective measures were completed at Evaluation unless otherwise noted.  DIAGNOSTIC FINDINGS:  Straightening of the cervical spine. Suboptimal visualization of cervicothoracic junction. Mild to moderate disc space narrowing C5-C6 and C6-C7 with mild disc space narrowing at C3-C4. Dens and lateral masses are within normal  limits.  COGNITION: Overall cognitive status: Within functional limits for tasks assessed  SENSATION: WFL  POSTURE: No Significant postural limitations  PALPATION: TTP along upper arm and deltoid tuberosity    CERVICAL ROM:   Active ROM A/PROM (deg) eval  Flexion WNL  Extension WNL  Right lateral flexion WNL  Left lateral flexion 80% a little bit of tightness  Right rotation 80% available, some slight pain  Left rotation WNL   (Blank rows = not tested)  UPPER EXTREMITY ROM: WFL no pain   UPPER EXTREMITY MMT: 4/5 flexion and abd, some shaking with resistance on R side, everything else 5/5   TREATMENT DATE:  05/28/24- EVAL                                                                                                                                 PATIENT EDUCATION:  Education details: POC, HEP, icing and avoiding repetitive and painful movements Person educated: Patient Education method: Explanation and Demonstration Education comprehension: verbalized understanding  HOME EXERCISE PROGRAM: Access Code: [email or web address] URL: [email or web address] Date: 05/28/2024 Prepared by: Almetta Fam  Exercises - Doorway Pec Stretch at 90 Degrees Abduction  - 1 x daily - 7 x weekly - 2 sets - 2 reps - 15 hold - Seated Posterior Shoulder Stretch  - 1 x daily - 7 x weekly - 2 reps - 15 hold - Shoulder extension with resistance -  Neutral  - 1 x daily - 7 x weekly - 2 sets - 10 reps - Standing Shoulder Row with Anchored Resistance  - 1 x daily - 7 x weekly - 2 sets - 10 reps - Seated Cervical Sidebending Stretch  - 1 x daily - 7 x weekly - 2 reps - 15 hold  ASSESSMENT:  CLINICAL IMPRESSION: Patient is a 68 y.o. male who was seen today for physical therapy evaluation and treatment for upper arm pain and N/T into lower arm and thumb. He pinpoints his pain at deltoid tuberosity. It is tender to touch and feels like a knot. He may have inflammation from repetitive  movements. He reports ice and Meloxicam  have been helping. The pain has improved since it started 2 months ago. However, he is still unable to lift overhead because reaching causes some pain. He is also very tight in bilateral upper traps. Patient will benefit from PT to address his pain, posture, and flexibility to allow him to complete household work without difficulty.   OBJECTIVE IMPAIRMENTS: decreased strength, impaired flexibility, improper body mechanics, and pain.   ACTIVITY LIMITATIONS: carrying, lifting, and reach over head  PARTICIPATION LIMITATIONS: cleaning, yard work, and work around [employer] POTENTIAL: Good  CLINICAL DECISION MAKING: Stable/uncomplicated  EVALUATION COMPLEXITY: Low  GOALS: Goals reviewed with patient? Yes  SHORT TERM GOALS: Target date: 06/25/24  Patient will be independent with initial HEP.  Baseline:  Goal status: INITIAL  2.  Patient will demonstrate full pain free cervical ROM. Baseline:  Goal status: INITIAL  LONG TERM GOALS: Target date: 07/23/24  Patient will be independent with advanced/ongoing HEP to improve outcomes and carryover.  Baseline:  Goal status: INITIAL  2.  Patient will report 75% improvement in neck/arm pain to improve QOL. (<2/10) Baseline: 5/10 Goal status: INITIAL  3.  Patient will be able to lift overhead and complete household projects without pain.  Baseline:  Goal status: INITIAL   PLAN:  PT FREQUENCY: 1x/week  PT DURATION: 8 weeks  PLANNED INTERVENTIONS: 97110-Therapeutic exercises, 97530- Therapeutic activity, 97112- Neuromuscular re-education, 97535- Self Care, 02859- Manual therapy, 97035- Ultrasound, 02966- Ionotophoresis 4mg /ml Dexamethasone, 20560 (1-2 muscles), 20561 (3+ muscles)- Dry Needling, Patient/Family education, Taping, Joint mobilization, Joint manipulation, Spinal manipulation, Spinal mobilization, Cryotherapy, and Moist heat  PLAN FOR NEXT SESSION: ionto patch if still having pain,  STW for upper traps, can start some strengthening as tolerated    Almetta Fam, PT 05/28/2024, 10:57 AM

## 2024-05-28 ENCOUNTER — Ambulatory Visit: Attending: Family Medicine

## 2024-05-28 DIAGNOSIS — M5412 Radiculopathy, cervical region: Secondary | ICD-10-CM | POA: Insufficient documentation

## 2024-05-28 DIAGNOSIS — M79621 Pain in right upper arm: Secondary | ICD-10-CM | POA: Diagnosis present

## 2024-05-28 DIAGNOSIS — M47812 Spondylosis without myelopathy or radiculopathy, cervical region: Secondary | ICD-10-CM | POA: Insufficient documentation

## 2024-06-03 ENCOUNTER — Ambulatory Visit: Admitting: Family Medicine

## 2024-06-03 ENCOUNTER — Encounter: Payer: Self-pay | Admitting: Family Medicine

## 2024-06-03 VITALS — BP 138/80 | Ht 76.0 in | Wt 255.0 lb

## 2024-06-03 DIAGNOSIS — M5412 Radiculopathy, cervical region: Secondary | ICD-10-CM | POA: Diagnosis not present

## 2024-06-03 DIAGNOSIS — M47812 Spondylosis without myelopathy or radiculopathy, cervical region: Secondary | ICD-10-CM | POA: Diagnosis not present

## 2024-06-03 NOTE — Progress Notes (Addendum)
 PCP: Joshua Debby CROME, MD  Subjective:   HPI: Patient is a 68 y.o. male here for follow up regarding right cervical radiculopathy.  Patient says that his pain is much improved.However he does still endorse some numbness and tingling in his thumb and his anterior forearm. He usually feels numbness/tingling most in the mornings.  Patient does also endorse some mild pain at the anterior proximal part of his upper arm. Says this recently flared after doing circular wiping motions helping his daughter clean her car.  Otherwise denies weakness or neck pain He recently finished his first prescription of meloxicam . He picked up his new prescription but did not take any meloxicam  today and feels his symptoms are manageable. He has been feeling a lot of relief with use of lidocaine patches.  He had one session of physical therapy so far with plan for 3 more session. He has been working on trapezius and paravertebral muscle relaxation techniques which he says have helped his symptoms as well.   Past Medical History:  Diagnosis Date   Allergy    Hyperlipidemia    Hypertension    Prediabetes    Sleep apnea    CPAP    Current Outpatient Medications on File Prior to Visit  Medication Sig Dispense Refill   amLODipine  (NORVASC ) 5 MG tablet TAKE 1 TABLET (5 MG TOTAL) BY MOUTH DAILY. 90 tablet 1   meloxicam  (MOBIC ) 15 MG tablet Take 1 tablet (15 mg total) by mouth daily. 30 tablet 1   Multiple Vitamins-Minerals (MENS 50+ ADVANCED PO) Take by mouth.     Phentermine-Topiramate (QSYMIA ) 3.75-23 MG CP24 Take 1 tablet daily for 14 days, then increase to dose of 7.5 mg / 46 mg daily. (Patient not taking: Reported on 05/05/2024) 14 capsule 0   Phentermine-Topiramate (QSYMIA ) 7.5-46 MG CP24 Take 1 tablet daily, start after completion of 14 days of 3.75 mg /23 mg dose 30 capsule 4   predniSONE  (DELTASONE ) 10 MG tablet Take as directed per MD instructions 21 tablet 0   rosuvastatin  (CRESTOR ) 20 MG tablet TAKE 1  TABLET BY MOUTH EVERY DAY 90 tablet 0   Testosterone  Undecanoate (JATENZO ) 158 MG CAPS Take 1 capsule (158 mg total) by mouth 2 (two) times daily with a meal. 60 capsule 5   triamterene -hydrochlorothiazide  (DYAZIDE) 37.5-25 MG capsule Take 1 each (1 capsule total) by mouth daily. 90 capsule 0   triamterene -hydrochlorothiazide  (DYAZIDE) 37.5-25 MG capsule Take 1 each (1 capsule total) by mouth daily. Take 1 Each (1 capsule total) by mouth daily. Patient to follow up with PCP prior to future refills 90 capsule 0   No current facility-administered medications on file prior to visit.    Past Surgical History:  Procedure Laterality Date   APPENDECTOMY     TOOTH EXTRACTION     VASECTOMY      Allergies  Allergen Reactions   Iodine Hives    BP 138/80   Ht 6' 4 (1.93 m)   Wt 255 lb (115.7 kg)   BMI 31.04 kg/m       No data to display              No data to display              Objective:  Physical Exam:  Gen: NAD, comfortable in exam room MSK:  No asymmetry on inspection  slightly range of motion of neck with extension, otherwise full range of motion, no pain with passive or active range of motion  No midline or paravertebral tenderness of cervical spine, no hypertrophy of paravertebral or trapezius muscles bilaterally  5/5 Strength in upper extremities, Sensation slightly different over anterior medial forearm on right side and right thumb, proprioception intact bilaterally  DTR 2/4 biceps, triceps, brachial, radialis bilaterally  Spurling's test with mild discomfort on right side, no radiating symptoms down his arm  Yergason's negative bilaterally    Assessment & Plan:   Assessment & Plan Right cervical radiculopathy Cervical spondylosis without myelopathy Patient's symptoms have much improved with just mild numbness remaining. Likely does not need MRI given improving symptoms.   PLAN: - Discussed gabapentin for remaining radicular symptoms, patient declined  as his symptoms were not very bothersome.  Could consider in the future if worsening.  - Continue physical therapy  - Can take meloxicam  as needed with goal of weaning off, continue using conservative measures such as lidocaine patches, voltaren gel  - Follow up in 6-8 weeks, consider MRI Cspine if worsening or not improving

## 2024-06-04 ENCOUNTER — Ambulatory Visit: Admitting: Physical Therapy

## 2024-06-04 DIAGNOSIS — M5412 Radiculopathy, cervical region: Secondary | ICD-10-CM

## 2024-06-04 DIAGNOSIS — M79621 Pain in right upper arm: Secondary | ICD-10-CM

## 2024-06-04 NOTE — Therapy (Signed)
 OUTPATIENT PHYSICAL THERAPY CERVICAL    Patient Name: Dustin Mckee MRN: [id number] DOB:1956-08-28, 68 y.o., male Today's Date: 06/04/2024  END OF SESSION:  PT End of Session - 06/04/24 0754     Visit Number 2    Date for PT Re-Evaluation 07/23/24    Authorization Type [employer]    PT Start Time 0754    PT Stop Time 0835    PT Time Calculation (min) 41 min          Past Medical History:  Diagnosis Date   Allergy    Hyperlipidemia    Hypertension    Prediabetes    Sleep apnea    CPAP   Past Surgical History:  Procedure Laterality Date   APPENDECTOMY     TOOTH EXTRACTION     VASECTOMY     Patient Active Problem List   Diagnosis Date Noted   Prediabetes 08/12/2023   Dyslipidemia, goal LDL below 100 08/12/2023   Rising PSA level 08/12/2023   Class 1 obesity due to excess calories without serious comorbidity with body mass index (BMI) of 33.0 to 33.9 in adult 03/06/2023   Primary hypertension 10/24/2022   Encounter for general adult medical examination with abnormal findings 10/24/2022   Class 1 obesity due to excess calories with serious comorbidity and body mass index (BMI) of 34.0 to 34.9 in adult 10/24/2022   Plantar fasciitis of right foot 05/11/2021   Obstructive sleep apnea treated with continuous positive airway pressure (CPAP) 01/13/2018   Excessive daytime sleepiness 10/15/2017   Seasonal allergic rhinitis due to pollen 10/15/2017   Vitamin D  deficiency 06/14/2014   Benign prostatic hyperplasia with urinary frequency 06/14/2014   GERD 01/23/2009   Sleep apnea 11/29/2008    PCP: Debby Molt  REFERRING PROVIDER: Rainell Cedar   REFERRING DIAG:  [phone] (ICD-10-CM) - Right cervical radiculopathy  M47.812 (ICD-10-CM) - Cervical spondylosis without myelopathy    THERAPY DIAG:  Radiculopathy, cervical region  Pain in right upper arm  Rationale for Evaluation and Treatment: Rehabilitation  ONSET DATE: about 2 months ago   SUBJECTIVE:                                                                                                                                                                                                          SUBJECTIVE STATEMENT: overall better. Mild discomfort, intermittent forearm tingling and RT thumb. Verb doing HEP and they are helping   Things are pretty okay. I don't know if it is really the neck anymore. The problem in mainly in the R  upper arm. Sometimes I have numbness in my arm and in my thumb. It started with repetitive motion to rub paint off the car on the fender.    PERTINENT HISTORY:  GM is a 68yo M pf R cervical radiculopathy f/u. - Seen 6/9 for R cervical radiculopathy, was given 6day pred taper.  - Since then, he reports his symptoms are overall improving. He is no longer having neck/shoulder pain, but he still has lingering soreness/pain in his triceps area. He also has a numb/tingling sensation that radiates down the dorsum of his forearm into the palmar aspect of his thumb. - He has been taking Aleve as needed for the pain - He has never had steroid injections in the past  PAIN:  Are you having pain? Yes: NPRS scale: 5/10 Pain location: R upper arm Pain description: it has gotten better since it initially started, pulling pain in the arm  Aggravating factors: nothing specifically  Relieving factors: Melodic, ice  PRECAUTIONS: None  RED FLAGS: None     WEIGHT BEARING RESTRICTIONS: No  FALLS:  Has patient fallen in last 6 months? No  LIVING ENVIRONMENT: Lives with: lives with their family Lives in: House/apartment  OCCUPATION: works at a computer  PLOF: Independent  PATIENT GOALS: be able make the pain go away so I can lift and do work around the house that requires lifting and reaching   NEXT MD VISIT: 06/03/24  OBJECTIVE:  Note: Objective measures were completed at Evaluation unless otherwise noted.  DIAGNOSTIC FINDINGS:  Straightening of the cervical spine. Suboptimal  visualization of cervicothoracic junction. Mild to moderate disc space narrowing C5-C6 and C6-C7 with mild disc space narrowing at C3-C4. Dens and lateral masses are within normal limits.  COGNITION: Overall cognitive status: Within functional limits for tasks assessed  SENSATION: WFL  POSTURE: No Significant postural limitations  PALPATION: TTP along upper arm and deltoid tuberosity    CERVICAL ROM:   Active ROM A/PROM (deg) eval  Flexion WNL  Extension WNL  Right lateral flexion WNL  Left lateral flexion 80% a little bit of tightness  Right rotation 80% available, some slight pain  Left rotation WNL   (Blank rows = not tested)  UPPER EXTREMITY ROM: WFL no pain   UPPER EXTREMITY MMT: 4/5 flexion and abd, some shaking with resistance on R side, everything else 5/5   TREATMENT DATE:   06/04/24 UBE L 4 2 min each way Cable pulleys standing row 15# 2 sets 10 Cable pulleys shld ext 15# 2 sets 10 Green tband ER 2 sets 10 Cerv retraction head on ball on wall 15 x hold 3 sec- increased forarm numbness Seated 5# row, horz abd, shld ext 2 sets 10- fatigue but no N/T Mech cerv tratcion 15 min   05/28/24- EVAL  PATIENT EDUCATION:  Education details: POC, HEP, icing and avoiding repetitive and painful movements Person educated: Patient Education method: Medical illustrator Education comprehension: verbalized understanding  HOME EXERCISE PROGRAM: Access Code: [email or web address] URL: [email or web address] Date: 06/04/2024 Prepared by: Janeli Lewison  Exercises - Seated Cervical Retraction  - 5 x daily - 7 x weekly - 1 sets - 10 reps - 3 hold - Shoulder External Rotation and Scapular Retraction with Resistance  - 1 x daily - 7 x weekly - 2 sets - 10 reps - Seated Shoulder Horizontal Abduction with Resistance - Palms Down  - 1 x  daily - 7 x weekly - 2 sets - 10 reps Access Code: [email or web address] URL: [email or web address] Date: 05/28/2024 Prepared by: Almetta Fam  Exercises - Doorway Pec Stretch at 90 Degrees Abduction  - 1 x daily - 7 x weekly - 2 sets - 2 reps - 15 hold - Seated Posterior Shoulder Stretch  - 1 x daily - 7 x weekly - 2 reps - 15 hold - Shoulder extension with resistance - Neutral  - 1 x daily - 7 x weekly - 2 sets - 10 reps - Standing Shoulder Row with Anchored Resistance  - 1 x daily - 7 x weekly - 2 sets - 10 reps - Seated Cervical Sidebending Stretch  - 1 x daily - 7 x weekly - 2 reps - 15 hold  ASSESSMENT:  CLINICAL IMPRESSION: pt arrives feeling better. Intermittent dull ache and N/T forearm and thurmb RT side. Pt reports doing HEP and I added  couple ex today. Cerv retraction increased some N/T but went away as soon as he stopped. Fatigue but no pain with ex. Postural cuing with ex. Cerv traction to address N/T     Patient is a 68 y.o. male who was seen today for physical therapy evaluation and treatment for upper arm pain and N/T into lower arm and thumb. He pinpoints his pain at deltoid tuberosity. It is tender to touch and feels like a knot. He may have inflammation from repetitive movements. He reports ice and Meloxicam  have been helping. The pain has improved since it started 2 months ago. However, he is still unable to lift overhead because reaching causes some pain. He is also very tight in bilateral upper traps. Patient will benefit from PT to address his pain, posture, and flexibility to allow him to complete household work without difficulty.   OBJECTIVE IMPAIRMENTS: decreased strength, impaired flexibility, improper body mechanics, and pain.   ACTIVITY LIMITATIONS: carrying, lifting, and reach over head  PARTICIPATION LIMITATIONS: cleaning, yard work, and work around [employer] POTENTIAL: Good  CLINICAL DECISION MAKING: Stable/uncomplicated  EVALUATION COMPLEXITY:  Low  GOALS: Goals reviewed with patient? Yes  SHORT TERM GOALS: Target date: 06/25/24  Patient will be independent with initial HEP.  Baseline:  Goal status: 06/04/24 MET  2.  Patient will demonstrate full pain free cervical ROM. Baseline:  Goal status: INITIAL  LONG TERM GOALS: Target date: 07/23/24  Patient will be independent with advanced/ongoing HEP to improve outcomes and carryover.  Baseline:  Goal status: INITIAL  2.  Patient will report 75% improvement in neck/arm pain to improve QOL. (<2/10) Baseline: 5/10 Goal status: INITIAL  3.  Patient will be able to lift overhead and complete household projects without pain.  Baseline:  Goal status: INITIAL   PLAN:  PT FREQUENCY: 1x/week  PT DURATION: 8 weeks  PLANNED INTERVENTIONS: 97110-Therapeutic exercises, 97530- Therapeutic activity, [id number]- Neuromuscular re-education, 97535- Self Care, 02859-  Manual therapy, [id number]- Ultrasound, 02966- Ionotophoresis 4mg /ml Dexamethasone, 20560 (1-2 muscles), 20561 (3+ muscles)- Dry Needling, Patient/Family education, Taping, Joint mobilization, Joint manipulation, Spinal manipulation, Spinal mobilization, Cryotherapy, and Moist heat  PLAN FOR NEXT SESSION: assess response to today session with ex and traction , progress with ex, STW and traction as needed  Jachob Mcclean,ANGIE, PTA 06/04/2024, 8:25 AM

## 2024-06-08 ENCOUNTER — Ambulatory Visit: Admitting: Physical Therapy

## 2024-06-08 DIAGNOSIS — M79621 Pain in right upper arm: Secondary | ICD-10-CM

## 2024-06-08 DIAGNOSIS — M5412 Radiculopathy, cervical region: Secondary | ICD-10-CM

## 2024-06-08 NOTE — Therapy (Signed)
 OUTPATIENT PHYSICAL THERAPY CERVICAL    Patient Name: STARLIN STEIB MRN: [id number] DOB:[birth year], 68 y.o., male Today's Date: 06/08/2024  END OF SESSION:  PT End of Session - 06/08/24 1313     Visit Number 3    Date for PT Re-Evaluation 07/23/24    Authorization Type [employer]    PT Start Time 1315    PT Stop Time 1355    PT Time Calculation (min) 40 min          Past Medical History:  Diagnosis Date   Allergy    Hyperlipidemia    Hypertension    Prediabetes    Sleep apnea    CPAP   Past Surgical History:  Procedure Laterality Date   APPENDECTOMY     TOOTH EXTRACTION     VASECTOMY     Patient Active Problem List   Diagnosis Date Noted   Prediabetes 08/12/2023   Dyslipidemia, goal LDL below 100 08/12/2023   Rising PSA level 08/12/2023   Class 1 obesity due to excess calories without serious comorbidity with body mass index (BMI) of 33.0 to 33.9 in adult 03/06/2023   Primary hypertension 10/24/2022   Encounter for general adult medical examination with abnormal findings 10/24/2022   Class 1 obesity due to excess calories with serious comorbidity and body mass index (BMI) of 34.0 to 34.9 in adult 10/24/2022   Plantar fasciitis of right foot 05/11/2021   Obstructive sleep apnea treated with continuous positive airway pressure (CPAP) 01/13/2018   Excessive daytime sleepiness 10/15/2017   Seasonal allergic rhinitis due to pollen 10/15/2017   Vitamin D  deficiency 06/14/2014   Benign prostatic hyperplasia with urinary frequency 06/14/2014   GERD 01/23/2009   Sleep apnea 11/29/2008    PCP: Debby Molt  REFERRING PROVIDER: Rainell Cedar   REFERRING DIAG:  [phone] (ICD-10-CM) - Right cervical radiculopathy  M47.812 (ICD-10-CM) - Cervical spondylosis without myelopathy    THERAPY DIAG:  Radiculopathy, cervical region  Pain in right upper arm  Rationale for Evaluation and Treatment: Rehabilitation  ONSET DATE: about 2 months ago   SUBJECTIVE:                                                                                                                                                                                                          SUBJECTIVE STATEMENT: overall better. Ex are going well. Traction helped. Numbness mostly gone.   PERTINENT HISTORY:  GM is a 68yo M pf R cervical radiculopathy f/u. - Seen 6/9 for R cervical radiculopathy, was given 6day pred taper.  -  Since then, he reports his symptoms are overall improving. He is no longer having neck/shoulder pain, but he still has lingering soreness/pain in his triceps area. He also has a numb/tingling sensation that radiates down the dorsum of his forearm into the palmar aspect of his thumb. - He has been taking Aleve as needed for the pain - He has never had steroid injections in the past  PAIN:  Are you having pain? Slight numbess PRECAUTIONS: None  RED FLAGS: None     WEIGHT BEARING RESTRICTIONS: No  FALLS:  Has patient fallen in last 6 months? No  LIVING ENVIRONMENT: Lives with: lives with their family Lives in: House/apartment  OCCUPATION: works at a computer  PLOF: Independent  PATIENT GOALS: be able make the pain go away so I can lift and do work around the house that requires lifting and reaching   NEXT MD VISIT: 06/03/24  OBJECTIVE:  Note: Objective measures were completed at Evaluation unless otherwise noted.  DIAGNOSTIC FINDINGS:  Straightening of the cervical spine. Suboptimal visualization of cervicothoracic junction. Mild to moderate disc space narrowing C5-C6 and C6-C7 with mild disc space narrowing at C3-C4. Dens and lateral masses are within normal limits.  COGNITION: Overall cognitive status: Within functional limits for tasks assessed  SENSATION: WFL  POSTURE: No Significant postural limitations  PALPATION: TTP along upper arm and deltoid tuberosity    CERVICAL ROM:   Active ROM A/PROM (deg) eval 06/08/24  Flexion WNL   Extension WNL    Right lateral flexion WNL   Left lateral flexion 80% a little bit of tightness WNL  Right rotation 80% available, some slight pain WNL  Left rotation WNL    (Blank rows = not tested)  UPPER EXTREMITY ROM: WFL no pain   UPPER EXTREMITY MMT: 4/5 flexion and abd, some shaking with resistance on R side, everything else 5/5   TREATMENT DATE:  06/08/24 UBE L 4 2 min each way Lat pull Down 35# 2 sets 10 Seated Row 35# 2 sets 10 Ball vs wall 5 x CW and CCW Red tband 3 way stab on wall 10 x BIL Cable pulleys standing row 15# 2 sets 10 Cable pulleys shld ext 15# 2 sets 10 Mech cerv tratcion 15 min    06/04/24 UBE L 4 2 min each way Cable pulleys standing row 15# 2 sets 10 Cable pulleys shld ext 15# 2 sets 10 Green tband ER 2 sets 10 Cerv retraction head on ball on wall 15 x hold 3 sec- increased forarm numbness Seated 5# row, horz abd, shld ext 2 sets 10- fatigue but no N/T Mech cerv tratcion 15 min   05/28/24- EVAL                                                                                                                                 PATIENT EDUCATION:  Education details: POC, HEP, icing and avoiding repetitive and painful movements Person educated:  Patient Education method: Explanation and Demonstration Education comprehension: verbalized understanding  HOME EXERCISE PROGRAM: Access Code: [email or web address] URL: [email or web address] Date: 06/04/2024 Prepared by: Alder Murri  Exercises - Seated Cervical Retraction  - 5 x daily - 7 x weekly - 1 sets - 10 reps - 3 hold - Shoulder External Rotation and Scapular Retraction with Resistance  - 1 x daily - 7 x weekly - 2 sets - 10 reps - Seated Shoulder Horizontal Abduction with Resistance - Palms Down  - 1 x daily - 7 x weekly - 2 sets - 10 reps Access Code: [email or web address] URL: [email or web address] Date: 05/28/2024 Prepared by: Almetta Fam  Exercises - Doorway Pec Stretch at 90 Degrees Abduction   - 1 x daily - 7 x weekly - 2 sets - 2 reps - 15 hold - Seated Posterior Shoulder Stretch  - 1 x daily - 7 x weekly - 2 reps - 15 hold - Shoulder extension with resistance - Neutral  - 1 x daily - 7 x weekly - 2 sets - 10 reps - Standing Shoulder Row with Anchored Resistance  - 1 x daily - 7 x weekly - 2 sets - 10 reps - Seated Cervical Sidebending Stretch  - 1 x daily - 7 x weekly - 2 reps - 15 hold  ASSESSMENT:  CLINICAL IMPRESSION: pt arrived feeling better overall. Minimal N/T in thumb. Verb doing HEP. Pt felt traction was helpful s we ended session with this again. Postural strengthening ex with cuing to not over engage traps. No pain or N/T but fatigue. All STG met.    Patient is a 68 y.o. male who was seen today for physical therapy evaluation and treatment for upper arm pain and N/T into lower arm and thumb. He pinpoints his pain at deltoid tuberosity. It is tender to touch and feels like a knot. He may have inflammation from repetitive movements. He reports ice and Meloxicam  have been helping. The pain has improved since it started 2 months ago. However, he is still unable to lift overhead because reaching causes some pain. He is also very tight in bilateral upper traps. Patient will benefit from PT to address his pain, posture, and flexibility to allow him to complete household work without difficulty.   OBJECTIVE IMPAIRMENTS: decreased strength, impaired flexibility, improper body mechanics, and pain.   ACTIVITY LIMITATIONS: carrying, lifting, and reach over head  PARTICIPATION LIMITATIONS: cleaning, yard work, and work around [employer] POTENTIAL: Good  CLINICAL DECISION MAKING: Stable/uncomplicated  EVALUATION COMPLEXITY: Low  GOALS: Goals reviewed with patient? Yes  SHORT TERM GOALS: Target date: 06/25/24  Patient will be independent with initial HEP.  Baseline:  Goal status: 06/04/24 MET  2.  Patient will demonstrate full pain free cervical ROM. Baseline:  Goal  status:06/08/24 MET  LONG TERM GOALS: Target date: 07/23/24  Patient will be independent with advanced/ongoing HEP to improve outcomes and carryover.  Baseline:  Goal status: INITIAL  2.  Patient will report 75% improvement in neck/arm pain to improve QOL. (<2/10) Baseline: 5/10 Goal status: INITIAL  3.  Patient will be able to lift overhead and complete household projects without pain.  Baseline:  Goal status: INITIAL   PLAN:  PT FREQUENCY: 1x/week  PT DURATION: 8 weeks  PLANNED INTERVENTIONS: 97110-Therapeutic exercises, 97530- Therapeutic activity, 97112- Neuromuscular re-education, 97535- Self Care, 02859- Manual therapy, 97035- Ultrasound, 02966- Ionotophoresis 4mg /ml Dexamethasone, 79439 (1-2 muscles), 20561 (3+ muscles)- Dry Needling, Patient/Family education, Taping, Joint mobilization, Joint manipulation, Spinal manipulation, Spinal mobilization,  Cryotherapy, and Moist heat  PLAN FOR NEXT SESSION:assess and progress Alberto Pina,ANGIE, PTA 06/08/2024, 1:46 PM

## 2024-06-18 ENCOUNTER — Encounter: Payer: Self-pay | Admitting: Endocrinology

## 2024-06-18 ENCOUNTER — Ambulatory Visit: Attending: Family Medicine | Admitting: Physical Therapy

## 2024-06-18 DIAGNOSIS — M5412 Radiculopathy, cervical region: Secondary | ICD-10-CM | POA: Diagnosis present

## 2024-06-18 DIAGNOSIS — M79621 Pain in right upper arm: Secondary | ICD-10-CM | POA: Diagnosis present

## 2024-06-18 NOTE — Therapy (Signed)
 OUTPATIENT PHYSICAL THERAPY CERVICAL    Patient Name: Dustin Mckee MRN: [id number] DOB:1956/02/02, 68 y.o., male Today's Date: 06/18/2024  END OF SESSION:  PT End of Session - 06/18/24 0757     Visit Number 4    Date for PT Re-Evaluation 07/23/24    Authorization Type [employer]    PT Start Time [phone]    PT Stop Time 0840    PT Time Calculation (min) 42 min          Past Medical History:  Diagnosis Date   Allergy    Hyperlipidemia    Hypertension    Prediabetes    Sleep apnea    CPAP   Past Surgical History:  Procedure Laterality Date   APPENDECTOMY     TOOTH EXTRACTION     VASECTOMY     Patient Active Problem List   Diagnosis Date Noted   Prediabetes 08/12/2023   Dyslipidemia, goal LDL below 100 08/12/2023   Rising PSA level 08/12/2023   Class 1 obesity due to excess calories without serious comorbidity with body mass index (BMI) of 33.0 to 33.9 in adult 03/06/2023   Primary hypertension 10/24/2022   Encounter for general adult medical examination with abnormal findings 10/24/2022   Class 1 obesity due to excess calories with serious comorbidity and body mass index (BMI) of 34.0 to 34.9 in adult 10/24/2022   Plantar fasciitis of right foot 05/11/2021   Obstructive sleep apnea treated with continuous positive airway pressure (CPAP) 01/13/2018   Excessive daytime sleepiness 10/15/2017   Seasonal allergic rhinitis due to pollen 10/15/2017   Vitamin D  deficiency 06/14/2014   Benign prostatic hyperplasia with urinary frequency 06/14/2014   GERD 01/23/2009   Sleep apnea 11/29/2008    PCP: Debby Molt  REFERRING PROVIDER: Rainell Cedar   REFERRING DIAG:  [phone] (ICD-10-CM) - Right cervical radiculopathy  M47.812 (ICD-10-CM) - Cervical spondylosis without myelopathy    THERAPY DIAG:  Radiculopathy, cervical region  Pain in right upper arm  Rationale for Evaluation and Treatment: Rehabilitation  ONSET DATE: about 2 months ago   SUBJECTIVE:                                                                                                                                                                                                          SUBJECTIVE STATEMENT: a lot better. Numbness much less and inconsistent. Slight thumb numbness- mornings are worst then slowly resolves.  PERTINENT HISTORY:  GM is a 68yo M pf R cervical radiculopathy f/u. - Seen 6/9 for R cervical radiculopathy, was given  6day pred taper.  - Since then, he reports his symptoms are overall improving. He is no longer having neck/shoulder pain, but he still has lingering soreness/pain in his triceps area. He also has a numb/tingling sensation that radiates down the dorsum of his forearm into the palmar aspect of his thumb. - He has been taking Aleve as needed for the pain - He has never had steroid injections in the past  PAIN:  Are you having pain? Slight numbess PRECAUTIONS: None  RED FLAGS: None     WEIGHT BEARING RESTRICTIONS: No  FALLS:  Has patient fallen in last 6 months? No  LIVING ENVIRONMENT: Lives with: lives with their family Lives in: House/apartment  OCCUPATION: works at a computer  PLOF: Independent  PATIENT GOALS: be able make the pain go away so I can lift and do work around the house that requires lifting and reaching   NEXT MD VISIT: 06/03/24  OBJECTIVE:  Note: Objective measures were completed at Evaluation unless otherwise noted.  DIAGNOSTIC FINDINGS:  Straightening of the cervical spine. Suboptimal visualization of cervicothoracic junction. Mild to moderate disc space narrowing C5-C6 and C6-C7 with mild disc space narrowing at C3-C4. Dens and lateral masses are within normal limits.  COGNITION: Overall cognitive status: Within functional limits for tasks assessed  SENSATION: WFL  POSTURE: No Significant postural limitations  PALPATION: TTP along upper arm and deltoid tuberosity    CERVICAL ROM:   Active ROM A/PROM (deg) eval  06/08/24  Flexion WNL   Extension WNL   Right lateral flexion WNL   Left lateral flexion 80% a little bit of tightness WNL  Right rotation 80% available, some slight pain WNL  Left rotation WNL    (Blank rows = not tested)  UPPER EXTREMITY ROM: WFL no pain   UPPER EXTREMITY MMT: 4/5 flexion and abd, some shaking with resistance on R side, everything else 5/5   TREATMENT DATE:  06/18/24 MMT UE WNLS Cerv ROM WNLs HEP checked and Goals Assessed UBE L4 3 min each way Seated rows 45# 2 sets 10 Lat Pull 45# 2 sets 10 6# OH press 10 x 2 sets 5# shld flex into abd 10x then reverse 10 x 6# seated row, shld ext and horz abd 2 sets 10 Mech traction 15 min   06/08/24 UBE L 4 2 min each way Lat pull Down 35# 2 sets 10 Seated Row 35# 2 sets 10 Ball vs wall 5 x CW and CCW Red tband 3 way stab on wall 10 x BIL Cable pulleys standing row 15# 2 sets 10 Cable pulleys shld ext 15# 2 sets 10 Mech cerv tratcion 15 min    06/04/24 UBE L 4 2 min each way Cable pulleys standing row 15# 2 sets 10 Cable pulleys shld ext 15# 2 sets 10 Green tband ER 2 sets 10 Cerv retraction head on ball on wall 15 x hold 3 sec- increased forarm numbness Seated 5# row, horz abd, shld ext 2 sets 10- fatigue but no N/T Mech cerv tratcion 15 min   05/28/24- EVAL  PATIENT EDUCATION:  Education details: POC, HEP, icing and avoiding repetitive and painful movements Person educated: Patient Education method: Medical illustrator Education comprehension: verbalized understanding  HOME EXERCISE PROGRAM: Access Code: [email or web address] URL: [email or web address] Date: 06/04/2024 Prepared by: Robbert Langlinais  Exercises - Seated Cervical Retraction  - 5 x daily - 7 x weekly - 1 sets - 10 reps - 3 hold - Shoulder External Rotation and Scapular Retraction with Resistance   - 1 x daily - 7 x weekly - 2 sets - 10 reps - Seated Shoulder Horizontal Abduction with Resistance - Palms Down  - 1 x daily - 7 x weekly - 2 sets - 10 reps Access Code: [email or web address] URL: [email or web address] Date: 05/28/2024 Prepared by: Almetta Fam  Exercises - Doorway Pec Stretch at 90 Degrees Abduction  - 1 x daily - 7 x weekly - 2 sets - 2 reps - 15 hold - Seated Posterior Shoulder Stretch  - 1 x daily - 7 x weekly - 2 reps - 15 hold - Shoulder extension with resistance - Neutral  - 1 x daily - 7 x weekly - 2 sets - 10 reps - Standing Shoulder Row with Anchored Resistance  - 1 x daily - 7 x weekly - 2 sets - 10 reps - Seated Cervical Sidebending Stretch  - 1 x daily - 7 x weekly - 2 reps - 15 hold  ASSESSMENT:  CLINICAL IMPRESSION: pt arrived feeling better overall 90%.Minimal N/T in thumb. Morninggs are worst then gradually get better. Progressed postural strengthening with cuing needed. No increase in symptoms.Assessed ROM,MMT and goals. Finished session with traction as he feels beneficial and helping symptoms   Patient is a 68 y.o. male who was seen today for physical therapy evaluation and treatment for upper arm pain and N/T into lower arm and thumb. He pinpoints his pain at deltoid tuberosity. It is tender to touch and feels like a knot. He may have inflammation from repetitive movements. He reports ice and Meloxicam  have been helping. The pain has improved since it started 2 months ago. However, he is still unable to lift overhead because reaching causes some pain. He is also very tight in bilateral upper traps. Patient will benefit from PT to address his pain, posture, and flexibility to allow him to complete household work without difficulty.   OBJECTIVE IMPAIRMENTS: decreased strength, impaired flexibility, improper body mechanics, and pain.   ACTIVITY LIMITATIONS: carrying, lifting, and reach over head  PARTICIPATION LIMITATIONS: cleaning, yard work, and work  around [employer] POTENTIAL: Good  CLINICAL DECISION MAKING: Stable/uncomplicated  EVALUATION COMPLEXITY: Low  GOALS: Goals reviewed with patient? Yes  SHORT TERM GOALS: Target date: 06/25/24  Patient will be independent with initial HEP.  Baseline:  Goal status: 06/04/24 MET  2.  Patient will demonstrate full pain free cervical ROM. Baseline:  Goal status:06/08/24 MET  LONG TERM GOALS: Target date: 07/23/24  Patient will be independent with advanced/ongoing HEP to improve outcomes and carryover.  Baseline:  Goal status: evolving 06/18/24  2.  Patient will report 75% improvement in neck/arm pain to improve QOL. (<2/10) Baseline: 5/10 Goal status: 06/18/24 MET 90%  3.  Patient will be able to lift overhead and complete household projects without pain.  Baseline:  Goal status: progressing 06/18/24   PLAN:  PT FREQUENCY: 1x/week  PT DURATION: 8 weeks  PLANNED INTERVENTIONS: 97110-Therapeutic exercises, 97530- Therapeutic activity, [id number]- Neuromuscular re-education, 97535- Self Care, 02859- Manual therapy, 97035- Ultrasound, 02966- Ionotophoresis 4mg /ml Dexamethasone, 20560 (1-2 muscles),  79438 (3+ muscles)- Dry Needling, Patient/Family education, Taping, Joint mobilization, Joint manipulation, Spinal manipulation, Spinal mobilization, Cryotherapy, and Moist heat  PLAN FOR NEXT SESSION:possible D/C or HOLD Dariusz Brase,ANGIE, PTA 06/18/2024, 8:27 AM

## 2024-06-21 NOTE — Telephone Encounter (Signed)
 I would recommend to contact his insurance to check if these medications are covered.  If he was able to refill Jatenzo  in the past, I would think he should be able to refill.  Otherwise you may have to do prior authorization.  Regarding Qsymia  is also the same, this is a weight loss medication as long as he has weight loss benefit through insurance this one or the other weight loss medication should be covered.  He can check with his insurance.  Iraq Adryana Mogensen, MD [facility] [address] [place], [address] [place], [place] [address] Phone # [phone]

## 2024-06-22 ENCOUNTER — Telehealth: Payer: Self-pay

## 2024-06-22 ENCOUNTER — Other Ambulatory Visit (HOSPITAL_COMMUNITY): Payer: Self-pay

## 2024-06-22 NOTE — Telephone Encounter (Signed)
 Pharmacy Patient Advocate Encounter   Received notification from Patient Advice Request messages that prior authorization for Qsymia  7.5-46MG  er capsules  is required/requested.   Insurance verification completed.   The patient is insured through [facility] .   Per test claim: PA required and submitted KEY/EOC/Request #: [id number] from 06/22/24 to 06/22/25  Case ID # [id number]

## 2024-06-22 NOTE — Telephone Encounter (Signed)
 Pharmacy Patient Advocate Encounter   Received notification from Patient Advice Request messages that prior authorization for Jatenzo  158mg  tablets is required/requested.   Insurance verification completed.   The patient is insured through [facility] [employer] .   Per test claim:  Testosterone  gel is preferred by the insurance.  If suggested medication is appropriate, Please send in a new RX and discontinue this one. If not, please advise as to why it's not appropriate so that we may request a Prior Authorization. Please note, some preferred medications may still require a PA.  If the suggested medications have not been trialed and there are no contraindications to their use, the PA will not be submitted, as it will not be approved.

## 2024-06-25 ENCOUNTER — Ambulatory Visit: Admitting: Physical Therapy

## 2024-06-25 DIAGNOSIS — M79621 Pain in right upper arm: Secondary | ICD-10-CM

## 2024-06-25 DIAGNOSIS — M5412 Radiculopathy, cervical region: Secondary | ICD-10-CM

## 2024-06-25 NOTE — Telephone Encounter (Signed)
 PA has been started through [employer]. Key: [facility]  Waiting for clinical questions to populate.

## 2024-06-25 NOTE — Therapy (Signed)
 OUTPATIENT PHYSICAL THERAPY CERVICAL    Patient Name: Dustin Mckee MRN: [id number] DOB:December 14, 1955, 68 y.o., male Today's Date: 06/25/2024  END OF SESSION:  PT End of Session - 06/25/24 0803     Visit Number 5    Date for PT Re-Evaluation 07/23/24    Authorization Type Aetna    PT Start Time 0800    PT Stop Time 0840    PT Time Calculation (min) 40 min          Past Medical History:  Diagnosis Date   Allergy    Hyperlipidemia    Hypertension    Prediabetes    Sleep apnea    CPAP   Past Surgical History:  Procedure Laterality Date   APPENDECTOMY     TOOTH EXTRACTION     VASECTOMY     Patient Active Problem List   Diagnosis Date Noted   Prediabetes 08/12/2023   Dyslipidemia, goal LDL below 100 08/12/2023   Rising PSA level 08/12/2023   Class 1 obesity due to excess calories without serious comorbidity with body mass index (BMI) of 33.0 to 33.9 in adult 03/06/2023   Primary hypertension 10/24/2022   Encounter for general adult medical examination with abnormal findings 10/24/2022   Class 1 obesity due to excess calories with serious comorbidity and body mass index (BMI) of 34.0 to 34.9 in adult 10/24/2022   Plantar fasciitis of right foot 05/11/2021   Obstructive sleep apnea treated with continuous positive airway pressure (CPAP) 01/13/2018   Excessive daytime sleepiness 10/15/2017   Seasonal allergic rhinitis due to pollen 10/15/2017   Vitamin D  deficiency 06/14/2014   Benign prostatic hyperplasia with urinary frequency 06/14/2014   GERD 01/23/2009   Sleep apnea 11/29/2008    PCP: Debby Molt  REFERRING PROVIDER: Rainell Cedar   REFERRING DIAG:  [phone] (ICD-10-CM) - Right cervical radiculopathy  M47.812 (ICD-10-CM) - Cervical spondylosis without myelopathy    THERAPY DIAG:  Radiculopathy, cervical region  Pain in right upper arm  Rationale for Evaluation and Treatment: Rehabilitation  ONSET DATE: about 2 months ago   SUBJECTIVE:                                                                                                                                                                                                          SUBJECTIVE STATEMENT: 95% better. Mild shld pain and mild numbness. Did some OH head work without any issues  PERTINENT HISTORY:  GM is a 68yo M pf R cervical radiculopathy f/u. - Seen 6/9 for R cervical radiculopathy, was given 6day  pred taper.  - Since then, he reports his symptoms are overall improving. He is no longer having neck/shoulder pain, but he still has lingering soreness/pain in his triceps area. He also has a numb/tingling sensation that radiates down the dorsum of his forearm into the palmar aspect of his thumb. - He has been taking Aleve as needed for the pain - He has never had steroid injections in the past  PAIN:  Are you having pain? Slight numbess PRECAUTIONS: None  RED FLAGS: None     WEIGHT BEARING RESTRICTIONS: No  FALLS:  Has patient fallen in last 6 months? No  LIVING ENVIRONMENT: Lives with: lives with their family Lives in: House/apartment  OCCUPATION: works at a computer  PLOF: Independent  PATIENT GOALS: be able make the pain go away so I can lift and do work around the house that requires lifting and reaching   NEXT MD VISIT: 06/03/24  OBJECTIVE:  Note: Objective measures were completed at Evaluation unless otherwise noted.  DIAGNOSTIC FINDINGS:  Straightening of the cervical spine. Suboptimal visualization of cervicothoracic junction. Mild to moderate disc space narrowing C5-C6 and C6-C7 with mild disc space narrowing at C3-C4. Dens and lateral masses are within normal limits.  COGNITION: Overall cognitive status: Within functional limits for tasks assessed  SENSATION: WFL  POSTURE: No Significant postural limitations  PALPATION: TTP along upper arm and deltoid tuberosity    CERVICAL ROM:   Active ROM A/PROM (deg) eval 06/08/24  Flexion WNL    Extension WNL   Right lateral flexion WNL   Left lateral flexion 80% a little bit of tightness WNL  Right rotation 80% available, some slight pain WNL  Left rotation WNL    (Blank rows = not tested)  UPPER EXTREMITY ROM: WFL no pain   UPPER EXTREMITY MMT: 4/5 flexion and abd, some shaking with resistance on R side, everything else 5/5   TREATMENT DATE:  06/25/24 UBE L 4 3 min each way Seated rows 45# 2 sets 12 Lat Pull 45# 2 sets 12 Chest Press 35# 2 sets 12 Push Ups 8 x Dips 2 sets 10 8# OH press 2 sets 10 35# upright row 2 sets 10 10# ER 2 sets 10- pain delt area 15# IR 2 sets 10  06/18/24 MMT UE WNLS Cerv ROM WNLs HEP checked and Goals Assessed UBE L4 3 min each way Seated rows 45# 2 sets 10 Lat Pull 45# 2 sets 10 6# OH press 10 x 2 sets 5# shld flex into abd 10x then reverse 10 x 6# seated row, shld ext and horz abd 2 sets 10 Mech traction 15 min   06/08/24 UBE L 4 2 min each way Lat pull Down 35# 2 sets 10 Seated Row 35# 2 sets 10 Ball vs wall 5 x CW and CCW Red tband 3 way stab on wall 10 x BIL Cable pulleys standing row 15# 2 sets 10 Cable pulleys shld ext 15# 2 sets 10 Mech cerv tratcion 15 min    06/04/24 UBE L 4 2 min each way Cable pulleys standing row 15# 2 sets 10 Cable pulleys shld ext 15# 2 sets 10 Green tband ER 2 sets 10 Cerv retraction head on ball on wall 15 x hold 3 sec- increased forarm numbness Seated 5# row, horz abd, shld ext 2 sets 10- fatigue but no N/T Mech cerv tratcion 15 min   05/28/24- EVAL  PATIENT EDUCATION:  Education details: POC, HEP, icing and avoiding repetitive and painful movements Person educated: Patient Education method: Medical illustrator Education comprehension: verbalized understanding  HOME EXERCISE PROGRAM: Access Code: [email or web address] URL:  [email or web address] Date: 06/04/2024 Prepared by: Semaya Vida  Exercises - Seated Cervical Retraction  - 5 x daily - 7 x weekly - 1 sets - 10 reps - 3 hold - Shoulder External Rotation and Scapular Retraction with Resistance  - 1 x daily - 7 x weekly - 2 sets - 10 reps - Seated Shoulder Horizontal Abduction with Resistance - Palms Down  - 1 x daily - 7 x weekly - 2 sets - 10 reps Access Code: [email or web address] URL: [email or web address] Date: 05/28/2024 Prepared by: Almetta Fam  Exercises - Doorway Pec Stretch at 90 Degrees Abduction  - 1 x daily - 7 x weekly - 2 sets - 2 reps - 15 hold - Seated Posterior Shoulder Stretch  - 1 x daily - 7 x weekly - 2 reps - 15 hold - Shoulder extension with resistance - Neutral  - 1 x daily - 7 x weekly - 2 sets - 10 reps - Standing Shoulder Row with Anchored Resistance  - 1 x daily - 7 x weekly - 2 sets - 10 reps - Seated Cervical Sidebending Stretch  - 1 x daily - 7 x weekly - 2 reps - 15 hold  ASSESSMENT:  CLINICAL IMPRESSION: pt arrives 95% better. Mild shld pain and mild numbness. Pt states did some OH work without issue. Verb doing HEP and working to get back in gym. Fatigue with advanced ex esp push ups and OH. Pain and weakness with ER. Still tender RT delt area. Asked pt to increase doing HEP with Tband esp ER and horz abd plus incorporate push ups  Patient is a 68 y.o. male who was seen today for physical therapy evaluation and treatment for upper arm pain and N/T into lower arm and thumb. He pinpoints his pain at deltoid tuberosity. It is tender to touch and feels like a knot. He may have inflammation from repetitive movements. He reports ice and Meloxicam  have been helping. The pain has improved since it started 2 months ago. However, he is still unable to lift overhead because reaching causes some pain. He is also very tight in bilateral upper traps. Patient will benefit from PT to address his pain, posture, and  flexibility to allow him to complete household work without difficulty.   OBJECTIVE IMPAIRMENTS: decreased strength, impaired flexibility, improper body mechanics, and pain.   ACTIVITY LIMITATIONS: carrying, lifting, and reach over head  PARTICIPATION LIMITATIONS: cleaning, yard work, and work around [employer] POTENTIAL: Good  CLINICAL DECISION MAKING: Stable/uncomplicated  EVALUATION COMPLEXITY: Low  GOALS: Goals reviewed with patient? Yes  SHORT TERM GOALS: Target date: 06/25/24  Patient will be independent with initial HEP.  Baseline:  Goal status: 06/04/24 MET  2.  Patient will demonstrate full pain free cervical ROM. Baseline:  Goal status:06/08/24 MET  LONG TERM GOALS: Target date: 07/23/24  Patient will be independent with advanced/ongoing HEP to improve outcomes and carryover.  Baseline:  Goal status: evolving 06/18/24  2.  Patient will report 75% improvement in neck/arm pain to improve QOL. (<2/10) Baseline: 5/10 Goal status: 06/18/24 MET 90%  3.  Patient will be able to lift overhead and complete household projects without pain.  Baseline:  Goal status: progressing 06/18/24   MET 06/25/24   PLAN:  PT FREQUENCY: 1x/week  PT DURATION: 8  weeks  PLANNED INTERVENTIONS: 97110-Therapeutic exercises, 97530- Therapeutic activity, 97112- Neuromuscular re-education, [phone]- Self Care, 02859- Manual therapy, 97035- Ultrasound, [phone]- Ionotophoresis 4mg /ml Dexamethasone, 20560 (1-2 muscles), 20561 (3+ muscles)- Dry Needling, Patient/Family education, Taping, Joint mobilization, Joint manipulation, Spinal manipulation, Spinal mobilization, Cryotherapy, and Moist heat  PLAN FOR NEXT SESSION:possible D/C or HOLD Esau Fridman,ANGIE, PTA 06/25/2024, 8:33 AM

## 2024-06-25 NOTE — Telephone Encounter (Signed)
 Clinical info submitted.

## 2024-06-27 ENCOUNTER — Other Ambulatory Visit: Payer: Self-pay | Admitting: Family Medicine

## 2024-07-01 NOTE — Therapy (Incomplete)
 OUTPATIENT PHYSICAL THERAPY CERVICAL    Patient Name: Dustin Mckee MRN: [id number] DOB:1955-12-10, 68 y.o., male Today's Date: 07/01/2024  END OF SESSION:    Past Medical History:  Diagnosis Date   Allergy    Hyperlipidemia    Hypertension    Prediabetes    Sleep apnea    CPAP   Past Surgical History:  Procedure Laterality Date   APPENDECTOMY     TOOTH EXTRACTION     VASECTOMY     Patient Active Problem List   Diagnosis Date Noted   Prediabetes 08/12/2023   Dyslipidemia, goal LDL below 100 08/12/2023   Rising PSA level 08/12/2023   Class 1 obesity due to excess calories without serious comorbidity with body mass index (BMI) of 33.0 to 33.9 in adult 03/06/2023   Primary hypertension 10/24/2022   Encounter for general adult medical examination with abnormal findings 10/24/2022   Class 1 obesity due to excess calories with serious comorbidity and body mass index (BMI) of 34.0 to 34.9 in adult 10/24/2022   Plantar fasciitis of right foot 05/11/2021   Obstructive sleep apnea treated with continuous positive airway pressure (CPAP) 01/13/2018   Excessive daytime sleepiness 10/15/2017   Seasonal allergic rhinitis due to pollen 10/15/2017   Vitamin D  deficiency 06/14/2014   Benign prostatic hyperplasia with urinary frequency 06/14/2014   GERD 01/23/2009   Sleep apnea 11/29/2008    PCP: Debby Molt  REFERRING PROVIDER: Rainell Cedar   REFERRING DIAG:  M54.12 (ICD-10-CM) - Right cervical radiculopathy  M47.812 (ICD-10-CM) - Cervical spondylosis without myelopathy    THERAPY DIAG:  No diagnosis found.  Rationale for Evaluation and Treatment: Rehabilitation  ONSET DATE: about 2 months ago   SUBJECTIVE:                                                                                                                                                                                                         SUBJECTIVE STATEMENT: 95% better. Mild shld pain and mild numbness.  Did some OH head work without any issues  PERTINENT HISTORY:  GM is a 68yo M pf R cervical radiculopathy f/u. - Seen 6/9 for R cervical radiculopathy, was given 6day pred taper.  - Since then, he reports his symptoms are overall improving. He is no longer having neck/shoulder pain, but he still has lingering soreness/pain in his triceps area. He also has a numb/tingling sensation that radiates down the dorsum of his forearm into the palmar aspect of his thumb. - He has been taking Aleve as needed for the pain - He  has never had steroid injections in the past  PAIN:  Are you having pain? Slight numbess PRECAUTIONS: None  RED FLAGS: None     WEIGHT BEARING RESTRICTIONS: No  FALLS:  Has patient fallen in last 6 months? No  LIVING ENVIRONMENT: Lives with: lives with their family Lives in: House/apartment  OCCUPATION: works at a computer  PLOF: Independent  PATIENT GOALS: be able make the pain go away so I can lift and do work around the house that requires lifting and reaching   NEXT MD VISIT: 06/03/24  OBJECTIVE:  Note: Objective measures were completed at Evaluation unless otherwise noted.  DIAGNOSTIC FINDINGS:  Straightening of the cervical spine. Suboptimal visualization of cervicothoracic junction. Mild to moderate disc space narrowing C5-C6 and C6-C7 with mild disc space narrowing at C3-C4. Dens and lateral masses are within normal limits.  COGNITION: Overall cognitive status: Within functional limits for tasks assessed  SENSATION: WFL  POSTURE: No Significant postural limitations  PALPATION: TTP along upper arm and deltoid tuberosity    CERVICAL ROM:   Active ROM A/PROM (deg) eval 06/08/24  Flexion WNL   Extension WNL   Right lateral flexion WNL   Left lateral flexion 80% a little bit of tightness WNL  Right rotation 80% available, some slight pain WNL  Left rotation WNL    (Blank rows = not tested)  UPPER EXTREMITY ROM: WFL no pain   UPPER  EXTREMITY MMT: 4/5 flexion and abd, some shaking with resistance on R side, everything else 5/5   TREATMENT DATE: 07/02/24 UBE Seated row Lat pull down Chest press Shoulder ext Wall push up  Arnold press 8#  Face pull 10#   06/25/24 UBE L 4 3 min each way Seated rows 45# 2 sets 12 Lat Pull 45# 2 sets 12 Chest Press 35# 2 sets 12 Push Ups 8 x Dips 2 sets 10 8# OH press 2 sets 10 35# upright row 2 sets 10 10# ER 2 sets 10- pain delt area 15# IR 2 sets 10  06/18/24 MMT UE WNLS Cerv ROM WNLs HEP checked and Goals Assessed UBE L4 3 min each way Seated rows 45# 2 sets 10 Lat Pull 45# 2 sets 10 6# OH press 10 x 2 sets 5# shld flex into abd 10x then reverse 10 x 6# seated row, shld ext and horz abd 2 sets 10 Mech traction 15 min   06/08/24 UBE L 4 2 min each way Lat pull Down 35# 2 sets 10 Seated Row 35# 2 sets 10 Ball vs wall 5 x CW and CCW Red tband 3 way stab on wall 10 x BIL Cable pulleys standing row 15# 2 sets 10 Cable pulleys shld ext 15# 2 sets 10 Mech cerv tratcion 15 min    06/04/24 UBE L 4 2 min each way Cable pulleys standing row 15# 2 sets 10 Cable pulleys shld ext 15# 2 sets 10 Green tband ER 2 sets 10 Cerv retraction head on ball on wall 15 x hold 3 sec- increased forarm numbness Seated 5# row, horz abd, shld ext 2 sets 10- fatigue but no N/T Mech cerv tratcion 15 min   05/28/24- EVAL  PATIENT EDUCATION:  Education details: POC, HEP, icing and avoiding repetitive and painful movements Person educated: Patient Education method: Medical illustrator Education comprehension: verbalized understanding  HOME EXERCISE PROGRAM: Access Code: [email or web address] URL: [email or web address] Date: 06/04/2024 Prepared by: Angela Payseur  Exercises - Seated Cervical Retraction  - 5 x daily - 7 x weekly - 1 sets - 10  reps - 3 hold - Shoulder External Rotation and Scapular Retraction with Resistance  - 1 x daily - 7 x weekly - 2 sets - 10 reps - Seated Shoulder Horizontal Abduction with Resistance - Palms Down  - 1 x daily - 7 x weekly - 2 sets - 10 reps Access Code: [email or web address] URL: [email or web address] Date: 05/28/2024 Prepared by: Almetta Fam  Exercises - Doorway Pec Stretch at 90 Degrees Abduction  - 1 x daily - 7 x weekly - 2 sets - 2 reps - 15 hold - Seated Posterior Shoulder Stretch  - 1 x daily - 7 x weekly - 2 reps - 15 hold - Shoulder extension with resistance - Neutral  - 1 x daily - 7 x weekly - 2 sets - 10 reps - Standing Shoulder Row with Anchored Resistance  - 1 x daily - 7 x weekly - 2 sets - 10 reps - Seated Cervical Sidebending Stretch  - 1 x daily - 7 x weekly - 2 reps - 15 hold  ASSESSMENT:  CLINICAL IMPRESSION: pt arrives 95% better. Mild shld pain and mild numbness. Pt states did some OH work without issue. Verb doing HEP and working to get back in gym. Fatigue with advanced ex esp push ups and OH. Pain and weakness with ER. Still tender RT delt area. Asked pt to increase doing HEP with Tband esp ER and horz abd plus incorporate push ups  Patient is a 68 y.o. male who was seen today for physical therapy evaluation and treatment for upper arm pain and N/T into lower arm and thumb. He pinpoints his pain at deltoid tuberosity. It is tender to touch and feels like a knot. He may have inflammation from repetitive movements. He reports ice and Meloxicam  have been helping. The pain has improved since it started 2 months ago. However, he is still unable to lift overhead because reaching causes some pain. He is also very tight in bilateral upper traps. Patient will benefit from PT to address his pain, posture, and flexibility to allow him to complete household work without difficulty.   OBJECTIVE IMPAIRMENTS: decreased strength, impaired flexibility, improper body mechanics, and  pain.   ACTIVITY LIMITATIONS: carrying, lifting, and reach over head  PARTICIPATION LIMITATIONS: cleaning, yard work, and work around [employer] POTENTIAL: Good  CLINICAL DECISION MAKING: Stable/uncomplicated  EVALUATION COMPLEXITY: Low  GOALS: Goals reviewed with patient? Yes  SHORT TERM GOALS: Target date: 06/25/24  Patient will be independent with initial HEP.  Baseline:  Goal status: 06/04/24 MET  2.  Patient will demonstrate full pain free cervical ROM. Baseline:  Goal status:06/08/24 MET  LONG TERM GOALS: Target date: 07/23/24  Patient will be independent with advanced/ongoing HEP to improve outcomes and carryover.  Baseline:  Goal status: evolving 06/18/24  2.  Patient will report 75% improvement in neck/arm pain to improve QOL. (<2/10) Baseline: 5/10 Goal status: 06/18/24 MET 90%  3.  Patient will be able to lift overhead and complete household projects without pain.  Baseline:  Goal status: progressing 06/18/24   MET 06/25/24   PLAN:  PT FREQUENCY: 1x/week  PT DURATION: 8  weeks  PLANNED INTERVENTIONS: 97110-Therapeutic exercises, 97530- Therapeutic activity, [id number]- Neuromuscular re-education, [phone]- Self Care, 02859- Manual therapy, 97035- Ultrasound, [phone]- Ionotophoresis 4mg /ml Dexamethasone, 20560 (1-2 muscles), 20561 (3+ muscles)- Dry Needling, Patient/Family education, Taping, Joint mobilization, Joint manipulation, Spinal manipulation, Spinal mobilization, Cryotherapy, and Moist heat  PLAN FOR NEXT SESSION:possible D/C or HOLD   Almetta Fam, PT 07/01/2024, 9:56 AM

## 2024-07-02 ENCOUNTER — Ambulatory Visit

## 2024-07-05 NOTE — Telephone Encounter (Signed)
 Pharmacy Patient Advocate Encounter  Received notification from [facility] that Prior Authorization for Jatenzo  158mg  has been DENIED.  Full denial letter will be uploaded to the media tab. See denial reason below.     PA #/Case ID/Reference #: [id number]

## 2024-07-13 NOTE — Progress Notes (Unsigned)
 [facility] [address] [place]. [place] [address] [phone]       OFFICE FOLLOW UP NOTE  Dustin Mckee Date of Birth:  [birth year] Medical Record Number:  [id number]    Primary neurologist: Dr. Chalice Reason for visit: CPAP follow-up    SUBJECTIVE:   CHIEF COMPLAINT:  No chief complaint on file.  Follow-up visit:  Prior visit: 07/15/2023  Brief HPI:   Dustin Mckee is a 68 y.o. male who was initially evaluated by Dr. Chalice on 03/06/2023 with known history of sleep apnea initially diagnosed in 2004 and ongoing use of CPAP.  Complaint of not feeling as refreshed with CPAP use.  Most recent sleep study 2018 and received new CPAP machine.  Repeat HST 03/31/2023 showed mild sleep apnea with total AHI 8.5/h and REM AHI 16/h.  Received new CPAP 05/20/2023.   Interval history:  Patient is being seen for CPAP compliance follow-up.  Compliance report shows excellent usage and optimal residual AHI.  Tolerating CPAP well, current use of nasal pillows. No questions or concerns today.  Followed by DME [facility].  ESS 2/24.           ROS:   14 system review of systems performed and negative with exception of those listed in HPI  PMH:  Past Medical History:  Diagnosis Date   Allergy    Hyperlipidemia    Hypertension    Prediabetes    Sleep apnea    CPAP    PSH:  Past Surgical History:  Procedure Laterality Date   APPENDECTOMY     TOOTH EXTRACTION     VASECTOMY      Social History:  Social History   Socioeconomic History   Marital status: Married    Spouse name: Barnie   Number of children: Not on file   Years of education: Not on file   Highest education level: Bachelor's degree (e.g., BA, AB, BS)  Occupational History   Not on file  Tobacco Use   Smoking status: Never   Smokeless tobacco: Never  Substance and Sexual Activity   Alcohol use: No   Drug use: No   Sexual activity: Yes    Partners: Female    Birth  control/protection: None  Other Topics Concern   Not on file  Social History Narrative   Drinks 1-2 cups caffeine daily.   Social Drivers of Corporate investment banker Strain: Low Risk  (08/11/2023)   Overall Financial Resource Strain (CARDIA)    Difficulty of Paying Living Expenses: Not hard at all  Food Insecurity: No Food Insecurity (08/11/2023)   Hunger Vital Sign    Worried About Running Out of Food in the Last Year: Never true    Ran Out of Food in the Last Year: Never true  Transportation Needs: No Transportation Needs (08/11/2023)   PRAPARE - Administrator, Civil Service (Medical): No    Lack of Transportation (Non-Medical): No  Physical Activity: Insufficiently Active (08/11/2023)   Exercise Vital Sign    Days of Exercise per Week: 2 days    Minutes of Exercise per Session: 20 min  Stress: No Stress Concern Present (08/11/2023)   [employer] of Occupational Health - Occupational Stress Questionnaire    Feeling of Stress : Not at all  Social Connections: Socially Integrated (08/11/2023)   Social Connection and Isolation Panel    Frequency of Communication with Friends and Family: More than three times a week    Frequency  of Social Gatherings with Friends and Family: More than three times a week    Attends Religious Services: More than 4 times per year    Active Member of [employer] or Organizations: Yes    Attends Engineer, structural: More than 4 times per year    Marital Status: Married  Catering manager Violence: Not on file    Family History:  Family History  Problem Relation Age of Onset   Heart disease Mother    Kidney disease Mother    Heart disease Father        defibrillator/CHF.   Diabetes Father    Heart disease Sister        CHF   Diabetes Sister    Heart disease Sister        CHF    Medications:   Current Outpatient Medications on File Prior to Visit  Medication Sig Dispense Refill   amLODipine  (NORVASC ) 5 MG tablet TAKE 1  TABLET (5 MG TOTAL) BY MOUTH DAILY. 90 tablet 1   meloxicam  (MOBIC ) 15 MG tablet Take 1 tablet (15 mg total) by mouth daily. 30 tablet 1   Multiple Vitamins-Minerals (MENS 50+ ADVANCED PO) Take by mouth.     Phentermine-Topiramate (QSYMIA ) 3.75-23 MG CP24 Take 1 tablet daily for 14 days, then increase to dose of 7.5 mg / 46 mg daily. (Patient not taking: Reported on 05/05/2024) 14 capsule 0   Phentermine-Topiramate (QSYMIA ) 7.5-46 MG CP24 Take 1 tablet daily, start after completion of 14 days of 3.75 mg /23 mg dose 30 capsule 4   predniSONE  (DELTASONE ) 10 MG tablet Take as directed per MD instructions 21 tablet 0   rosuvastatin  (CRESTOR ) 20 MG tablet TAKE 1 TABLET BY MOUTH EVERY DAY 90 tablet 0   Testosterone  Undecanoate (JATENZO ) 158 MG CAPS Take 1 capsule (158 mg total) by mouth 2 (two) times daily with a meal. 60 capsule 5   triamterene -hydrochlorothiazide  (DYAZIDE) 37.5-25 MG capsule Take 1 each (1 capsule total) by mouth daily. 90 capsule 0   triamterene -hydrochlorothiazide  (DYAZIDE) 37.5-25 MG capsule Take 1 each (1 capsule total) by mouth daily. Take 1 Each (1 capsule total) by mouth daily. Patient to follow up with PCP prior to future refills 90 capsule 0   No current facility-administered medications on file prior to visit.    Allergies:   Allergies  Allergen Reactions   Iodine Hives      OBJECTIVE:  Physical Exam  There were no vitals filed for this visit.  There is no height or weight on file to calculate BMI. No results found.   General: well developed, well nourished, very pleasant middle-age African-American male, seated, in no evident distress Head: head normocephalic and atraumatic.   Neck: supple with no carotid or supraclavicular bruits Cardiovascular: regular rate and rhythm, no murmurs Musculoskeletal: no deformity Skin:  no rash/petichiae Vascular:  Normal pulses all extremities   Neurologic Exam Mental Status: Awake and fully alert. Oriented to place and  time. Recent and remote memory intact. Attention span, concentration and fund of knowledge appropriate. Mood and affect appropriate.  Cranial Nerves: Pupils equal, briskly reactive to light. Extraocular movements full without nystagmus. Visual fields full to confrontation. Hearing intact. Facial sensation intact. Face, tongue, palate moves normally and symmetrically.  Motor: Normal bulk and tone. Normal strength in all tested extremity muscles Sensory.: intact to touch , pinprick , position and vibratory sensation.  Coordination: Rapid alternating movements normal in all extremities. Finger-to-nose and heel-to-shin performed accurately bilaterally. Gait and Station:  Arises from chair without difficulty. Stance is normal. Gait demonstrates normal stride length and balance without use of AD.  Reflexes: 1+ and symmetric. Toes downgoing.         ASSESSMENT/PLAN: Dustin Mckee is a 68 y.o. year old male    OSA on CPAP :  Compliance report shows satisfactory usage with optimal residual AHI.   Continue current pressure settings of 6-16 with EPR 3.   Discussed continued nightly usage with ensuring greater than 4 hours nightly for optimal benefit and per insurance purposes.   DME [facility], continue to follow with DME company for any needed supplies or CPAP related concerns CPAP set up 05/2023      Follow up in 1 year or call earlier if needed   CC:  PCP: Joshua Debby CROME, MD    I personally spent a total of *** minutes in the care of the patient today including {Time Based Coding:[id number]}. e   Harlene Bogaert, AGNP-BC  [facility] [address] [place], [place] [address]  Phone [phone] Fax [phone] Note: This document was prepared with digital dictation and possible smart phrase technology. Any transcriptional errors that result from this process are unintentional.

## 2024-07-14 ENCOUNTER — Ambulatory Visit: Payer: No Typology Code available for payment source | Admitting: Adult Health

## 2024-07-14 ENCOUNTER — Encounter: Payer: Self-pay | Admitting: Adult Health

## 2024-07-14 VITALS — BP 140/83 | HR 83 | Ht 75.0 in | Wt 266.0 lb

## 2024-07-14 DIAGNOSIS — G4733 Obstructive sleep apnea (adult) (pediatric): Secondary | ICD-10-CM

## 2024-07-14 NOTE — Progress Notes (Signed)
 Mihaela Fajardo D, CMA  Joylene Bradley; Garcia, Patricia; Ziegler, Melissa; Tucker, Dolanda; Cain, [place] New orders have been placed for the above pt, DOB: 02/10/1956 Thanks

## 2024-07-16 ENCOUNTER — Ambulatory Visit: Attending: Family Medicine | Admitting: Physical Therapy

## 2024-07-26 ENCOUNTER — Ambulatory Visit: Admitting: Family Medicine

## 2024-07-28 ENCOUNTER — Other Ambulatory Visit: Payer: Self-pay | Admitting: Internal Medicine

## 2024-07-28 DIAGNOSIS — E785 Hyperlipidemia, unspecified: Secondary | ICD-10-CM

## 2024-08-30 ENCOUNTER — Other Ambulatory Visit: Payer: Self-pay | Admitting: Internal Medicine

## 2024-08-30 DIAGNOSIS — E785 Hyperlipidemia, unspecified: Secondary | ICD-10-CM

## 2024-09-03 ENCOUNTER — Other Ambulatory Visit: Payer: Self-pay | Admitting: Internal Medicine

## 2024-09-03 DIAGNOSIS — E785 Hyperlipidemia, unspecified: Secondary | ICD-10-CM

## 2024-09-27 ENCOUNTER — Ambulatory Visit: Admitting: Endocrinology

## 2024-09-27 ENCOUNTER — Encounter: Payer: Self-pay | Admitting: Endocrinology

## 2024-09-27 VITALS — BP 128/62 | HR 72 | Ht 75.0 in | Wt 250.0 lb

## 2024-09-27 DIAGNOSIS — E230 Hypopituitarism: Secondary | ICD-10-CM

## 2024-09-27 DIAGNOSIS — Z125 Encounter for screening for malignant neoplasm of prostate: Secondary | ICD-10-CM | POA: Diagnosis not present

## 2024-09-27 DIAGNOSIS — E66811 Obesity, class 1: Secondary | ICD-10-CM | POA: Diagnosis not present

## 2024-09-27 DIAGNOSIS — Z5181 Encounter for therapeutic drug level monitoring: Secondary | ICD-10-CM

## 2024-09-27 MED ORDER — PHENTERMINE-TOPIRAMATE ER 7.5-46 MG PO CP24: ORAL_CAPSULE | ORAL | 4 refills | Status: AC

## 2024-09-27 MED ORDER — TESTOSTERONE 20.25 MG/1.25GM (1.62%) TD GEL
TRANSDERMAL | 1 refills | Status: AC
Start: 2024-09-27 — End: ?

## 2024-09-27 NOTE — Progress Notes (Signed)
 Outpatient Endocrinology Note Kalonji Zurawski, MD   Patient's Name: Dustin Mckee    DOB: 07-15-1956    MRN: [id number]  REASON OF VISIT: Follow up for hypogonadism  PCP:  Joshua Debby CROME, MD  HISTORY OF PRESENT ILLNESS:   Dustin Mckee is a 68 y.o. old male with past medical history listed below, is here for follow up for hypogonadism /obesity.  Pertinent Hx: Patient was previously seen by Dr. Von and was last time seen in March 2024.  Patient was following for management of hypogonadism.  He was diagnosed with hypogonadism in 2012 by his previous primary care provider at that time had complaints of fatigue and lethargy.  No records of initial evaluation of hypogonadism is available to review.  He was treated with testosterone  gel for several months in the past with improvement on symptoms, and then he stopped on his own.  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  He was treated with clomiphene  since early of 2017.  He had testosterone  level as low as 127 in the past without treatment.  He had baseline free testosterone  level 19 with normal of more than 35.8. When he was on clomiphene  3 times a week testosterone  level was up to 326.  With recurrence of symptoms later clomiphene  was switched to testosterone  gel 2 pumps daily and had improvement of the symptoms.  He had normalization of testosterone  with AndroGel  1.62% 3 pumps daily.  Which was later changed to Natesto  because of high hemoglobin of 17.9.    He ultimately stopped Natesto  with concern of nasal congestion.  He had intermittent follow-up with endocrinology in the past.   Visit in March 2025 : He had significantly low testosterone  including total testosterone  of 164, testosterone  low  20.8 with normal gonadotropins, consistent with hypogonadotropic hypogonadism.  Started on Jatenzo  158 mg 2 times a day, had normalization of testosterone  levels.  Later denied by medical insurance.  Obesity : BMI 33 He has history of prediabetes, obesity BMI 33 on diet control and lifestyle modification, hemoglobin A1c was 5.8% in September 2024.  He was treated with Qsymia  in the past.  Restarted on Qsymia  3.75/20 mg 1 tablet daily for 14 days and increase to 7.5/46 mg daily, beginning February 2025.  Other : He has sleep apnea treated with CPAP.  Interval history Patient stopped taking Jatenzo , was denied by medical insurance and has not taken since summer of this year.  He has been taking Qsymia , lost about 5 pounds of weight since last visit.  He wants to continue it.  Medical insurance has preference for testosterone  gel instead of Jatenzo , he wants to retry.  No other complaints today.  REVIEW OF SYSTEMS:  As per history of present illness.   PAST MEDICAL HISTORY: Past Medical History:  Diagnosis Date   Allergy    Hyperlipidemia    Hypertension    Prediabetes    Sleep apnea    CPAP    PAST SURGICAL HISTORY: Past Surgical History:  Procedure Laterality Date   APPENDECTOMY     TOOTH EXTRACTION     VASECTOMY      ALLERGIES: Allergies  Allergen Reactions   Iodine Hives    FAMILY HISTORY:  Family History  Problem Relation Age of Onset   Heart disease Mother  Kidney disease Mother    Heart disease Father        defibrillator/CHF.   Diabetes Father    Heart disease Sister        CHF   Diabetes Sister    Heart disease Sister        CHF    SOCIAL HISTORY: Social History   Socioeconomic History   Marital status: Married    Spouse name: Barnie   Number of children: Not on file   Years of education: Not on file   Highest education level: Bachelor's degree (e.g., BA, AB, BS)  Occupational History   Not on file  Tobacco Use   Smoking status: Never    Smokeless tobacco: Never  Substance and Sexual Activity   Alcohol use: No   Drug use: No   Sexual activity: Yes    Partners: Female    Birth control/protection: None  Other Topics Concern   Not on file  Social History Narrative   Drinks 1-2 cups caffeine daily.   Social Drivers of Corporate Investment Banker Strain: Low Risk  (08/11/2023)   Overall Financial Resource Strain (CARDIA)    Difficulty of Paying Living Expenses: Not hard at all  Food Insecurity: No Food Insecurity (08/11/2023)   Hunger Vital Sign    Worried About Running Out of Food in the Last Year: Never true    Ran Out of Food in the Last Year: Never true  Transportation Needs: No Transportation Needs (08/11/2023)   PRAPARE - Administrator, Civil Service (Medical): No    Lack of Transportation (Non-Medical): No  Physical Activity: Insufficiently Active (08/11/2023)   Exercise Vital Sign    Days of Exercise per Week: 2 days    Minutes of Exercise per Session: 20 min  Stress: No Stress Concern Present (08/11/2023)   Dustin Mckee of Occupational Health - Occupational Stress Questionnaire    Feeling of Stress : Not at all  Social Connections: Socially Integrated (08/11/2023)   Social Connection and Isolation Panel    Frequency of Communication with Friends and Family: More than three times a week    Frequency of Social Gatherings with Friends and Family: More than three times a week    Attends Religious Services: More than 4 times per year    Active Member of [employer] or Organizations: Yes    Attends Engineer, Structural: More than 4 times per year    Marital Status: Married    MEDICATIONS:  Current Outpatient Medications  Medication Sig Dispense Refill   Testosterone  (ANDROGEL ) 20.25 MG/1.25GM (1.62%) GEL Use 1 pump on each shoulder daily in the morning. 75 g 1   amLODipine  (NORVASC ) 5 MG tablet TAKE 1 TABLET (5 MG TOTAL) BY MOUTH DAILY. 90 tablet 1   Multiple Vitamins-Minerals (MENS 50+  ADVANCED PO) Take by mouth.     Phentermine-Topiramate ER (QSYMIA ) 7.5-46 MG CP24 Take 1 tablet daily, start after completion of 14 days of 3.75 mg /23 mg dose 30 capsule 4   rosuvastatin  (CRESTOR ) 20 MG tablet Take 1 tablet (20 mg total) by mouth daily. Please schedule an appt with PCP for further refills 30 tablet 0   triamterene -hydrochlorothiazide  (DYAZIDE) 37.5-25 MG capsule Take 1 each (1 capsule total) by mouth daily. Take 1 Each (1 capsule total) by mouth daily. Patient to follow up with PCP prior to future refills 90 capsule 0   No current facility-administered medications for this visit.    PHYSICAL EXAM: Vitals:  09/27/24 0808 09/27/24 0811  BP: (!) 150/78 128/62  Pulse: 72   SpO2: 98%   Weight: 250 lb (113.4 kg)   Height: 6' 3 (1.905 m)     Body mass index is 31.25 kg/m.  Wt Readings from Last 3 Encounters:  09/27/24 250 lb (113.4 kg)  07/14/24 266 lb (120.7 kg)  06/03/24 255 lb (115.7 kg)    General: Well developed, well nourished male in no apparent distress. Non-Cushingoid. No obvious Klinefelter's syndrome body habitus HEENT: AT/[place], no external lesions. Hearing intact to the spoken word Eyes: EOMI. No proptosis, stare and lid lag. Conjunctiva clear and no icterus. Neck: Trachea midline, neck supple without appreciable thyromegaly or lymphadenopathy and no palpable thyroid  nodules Lungs: Clear to auscultation, no wheeze. Respirations not labored Heart: S1S2, Regular in rate and rhythm.  Abdomen: Soft, non tender, non distended Neurologic: Alert, oriented, normal speech, deep tendon biceps reflexes normal,  no gross focal neurological deficit. No obvious proximal weakness Extremities: No pedal pitting edema, no tremors of outstretched hands, no excessive hair growth Skin: Warm, color good. Psychiatric: Does not appear depressed or anxious.   PERTINENT HISTORIC LABORATORY AND IMAGING STUDIES:  All pertinent laboratory results were reviewed. Please see HPI also for  further details.    Latest Reference Range & Units 10/24/22 14:19 08/11/23 16:03 12/03/23 15:04  PSA 0.10 - 4.00 ng/mL 3.57 4.45 (H) 4.04 (H)  (H): Data is abnormally high  ASSESSMENT / PLAN  1. Hypogonadotropic hypogonadism   2. Obesity (BMI 30.0-34.9)   3. Encounter for medication monitoring   4. Screening for prostate cancer    - Patient has hypogonadotropic hypogonadism.  He was following intermittently in this clinic.  He was on and off of testosterone  therapy.  Reevaluation in March 2025 he had low total and free testosterone  with inappropriately normal gonadotropins consistent with hypogonadotropic hypogonadism.  - Jatenzo  testosterone  therapy was started in March 2025.  He had been on topical and nasal testosterone , and clomiphene  in the past.  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  -Jatenzo  was denied by medical insurance and has not taken for a few months.  -He has been on Qsymia  for weight management.  Plan: - Start of AndroGel  1.62% 2 pumps daily. - Continue Qsymia  7.5/46 mg 1 tablet daily.   - Check labs as ordered prior to follow-up visit.    Seichi Kaufhold was seen today for follow-up.  Diagnoses and all orders for this visit:  Hypogonadotropic hypogonadism -     Testosterone  (ANDROGEL ) 20.25 MG/1.25GM (1.62%) GEL; Use 1 pump on each shoulder daily in the morning. -     Hematocrit -     Testosterone , Total, LC/MS/MS  Obesity (BMI 30.0-34.9) -     Phentermine-Topiramate ER (QSYMIA ) 7.5-46 MG CP24; Take 1 tablet daily, start after completion of 14 days of 3.75 mg /23 mg dose  Encounter for medication monitoring -     Hematocrit -     AST  Screening for prostate cancer -     PSA   DISPOSITION Follow up in clinic in 4 months suggested.  Labs prior to follow-up visit as ordered.  All questions answered and patient verbalized understanding of the plan.  Yana Schorr, MD [facility] Endocrinology [facility] [address] [place], [address] [place], [place] [address] Phone # [phone]  At least part of this note was generated using voice recognition software. Inadvertent word errors may have occurred, which were not recognized  during the proofreading process.

## 2024-10-04 ENCOUNTER — Encounter: Payer: Self-pay | Admitting: Endocrinology

## 2024-10-05 ENCOUNTER — Other Ambulatory Visit: Payer: Self-pay

## 2024-10-05 NOTE — Telephone Encounter (Signed)
 I had sent prescription to [facility] on November 17, please double check with pharmacy.

## 2024-10-06 ENCOUNTER — Telehealth: Payer: Self-pay

## 2024-10-06 ENCOUNTER — Other Ambulatory Visit (HOSPITAL_COMMUNITY): Payer: Self-pay

## 2024-10-06 NOTE — Telephone Encounter (Signed)
 Pharmacy Patient Advocate Encounter   Received notification from Patient Advice Request messages that prior authorization for Testosterone  20.25 MG/1.25GM(1.62%) gel is required/requested.   Insurance verification completed.   The patient is insured through [facility].   Per test claim: PA required; PA submitted to above mentioned insurance via Latent Key/confirmation #/EOC [id number] Status is pending   PA has been APPROVED through 10/06/2025  PA case ID: [id number]

## 2024-10-08 ENCOUNTER — Other Ambulatory Visit: Payer: Self-pay | Admitting: Medical Genetics

## 2024-10-13 ENCOUNTER — Other Ambulatory Visit: Payer: Self-pay

## 2024-10-14 ENCOUNTER — Ambulatory Visit: Payer: Self-pay | Admitting: Internal Medicine

## 2024-10-14 ENCOUNTER — Ambulatory Visit: Admitting: Internal Medicine

## 2024-10-14 ENCOUNTER — Encounter: Payer: Self-pay | Admitting: Internal Medicine

## 2024-10-14 VITALS — BP 144/86 | HR 79 | Temp 98.0°F | Resp 16 | Ht 75.0 in | Wt 252.6 lb

## 2024-10-14 DIAGNOSIS — I10 Essential (primary) hypertension: Secondary | ICD-10-CM

## 2024-10-14 DIAGNOSIS — N401 Enlarged prostate with lower urinary tract symptoms: Secondary | ICD-10-CM

## 2024-10-14 DIAGNOSIS — Z23 Encounter for immunization: Secondary | ICD-10-CM | POA: Insufficient documentation

## 2024-10-14 DIAGNOSIS — K219 Gastro-esophageal reflux disease without esophagitis: Secondary | ICD-10-CM

## 2024-10-14 DIAGNOSIS — K635 Polyp of colon: Secondary | ICD-10-CM | POA: Insufficient documentation

## 2024-10-14 DIAGNOSIS — E785 Hyperlipidemia, unspecified: Secondary | ICD-10-CM

## 2024-10-14 DIAGNOSIS — R7303 Prediabetes: Secondary | ICD-10-CM

## 2024-10-14 DIAGNOSIS — R972 Elevated prostate specific antigen [PSA]: Secondary | ICD-10-CM

## 2024-10-14 LAB — CBC WITH DIFFERENTIAL/PLATELET
Basophils Absolute: 0 K/uL (ref 0.0–0.1)
Basophils Relative: 0.3 % (ref 0.0–3.0)
Eosinophils Absolute: 0.1 K/uL (ref 0.0–0.7)
Eosinophils Relative: 1.5 % (ref 0.0–5.0)
HCT: 47.9 % (ref 39.0–52.0)
Hemoglobin: 16.1 g/dL (ref 13.0–17.0)
Lymphocytes Relative: 31.9 % (ref 12.0–46.0)
Lymphs Abs: 1.7 K/uL (ref 0.7–4.0)
MCHC: 33.7 g/dL (ref 30.0–36.0)
MCV: 86.4 fl (ref 78.0–100.0)
Monocytes Absolute: 0.4 K/uL (ref 0.1–1.0)
Monocytes Relative: 6.9 % (ref 3.0–12.0)
Neutro Abs: 3.2 K/uL (ref 1.4–7.7)
Neutrophils Relative %: 59.4 % (ref 43.0–77.0)
Platelets: 243 K/uL (ref 150.0–400.0)
RBC: 5.55 Mil/uL (ref 4.22–5.81)
RDW: 15.1 % (ref 11.5–15.5)
WBC: 5.4 K/uL (ref 4.0–10.5)

## 2024-10-14 LAB — URINALYSIS, ROUTINE W REFLEX MICROSCOPIC
Bilirubin Urine: NEGATIVE
Hgb urine dipstick: NEGATIVE
Ketones, ur: NEGATIVE
Leukocytes,Ua: NEGATIVE
Nitrite: NEGATIVE
RBC / HPF: NONE SEEN (ref 0–?)
Specific Gravity, Urine: 1.01 (ref 1.000–1.030)
Total Protein, Urine: NEGATIVE
Urine Glucose: NEGATIVE
Urobilinogen, UA: 0.2 (ref 0.0–1.0)
pH: 7 (ref 5.0–8.0)

## 2024-10-14 LAB — LIPID PANEL
Cholesterol: 268 mg/dL — ABNORMAL HIGH (ref 0–200)
HDL: 51.9 mg/dL (ref >39.00)
LDL Cholesterol: 188 mg/dL — ABNORMAL HIGH (ref 0–99)
NonHDL: 215.93
Total CHOL/HDL Ratio: 5
Triglycerides: 142 mg/dL (ref 0.0–149.0)
VLDL: 28.4 mg/dL (ref 0.0–40.0)

## 2024-10-14 LAB — TSH: TSH: 2.71 u[IU]/mL (ref 0.35–5.50)

## 2024-10-14 LAB — BASIC METABOLIC PANEL WITH GFR
BUN: 15 mg/dL (ref 6–23)
CO2: 29 meq/L (ref 19–32)
Calcium: 9.8 mg/dL (ref 8.4–10.5)
Chloride: 100 meq/L (ref 96–112)
Creatinine, Ser: 1.15 mg/dL (ref 0.40–1.50)
GFR: 66.44 mL/min (ref >60.00)
Glucose, Bld: 99 mg/dL (ref 70–99)
Potassium: 3.9 meq/L (ref 3.5–5.1)
Sodium: 141 meq/L (ref 135–145)

## 2024-10-14 LAB — HEPATIC FUNCTION PANEL
ALT: 19 U/L (ref 0–53)
AST: 15 U/L (ref 0–37)
Albumin: 4.8 g/dL (ref 3.5–5.2)
Alkaline Phosphatase: 41 U/L (ref 39–117)
Bilirubin, Direct: 0.1 mg/dL (ref 0.0–0.3)
Total Bilirubin: 0.6 mg/dL (ref 0.2–1.2)
Total Protein: 7.4 g/dL (ref 6.0–8.3)

## 2024-10-14 LAB — HEMOGLOBIN A1C: Hgb A1c MFr Bld: 5.6 % (ref 4.6–6.5)

## 2024-10-14 LAB — PSA: PSA: 3.87 ng/mL (ref 0.10–4.00)

## 2024-10-14 MED ORDER — ROSUVASTATIN CALCIUM 20 MG PO TABS: 20.0000 mg | ORAL_TABLET | Freq: Every day | ORAL | 10 refills | Status: AC

## 2024-10-14 MED ORDER — AMLODIPINE BESYLATE 10 MG PO TABS: 10.0000 mg | ORAL_TABLET | Freq: Every day | ORAL | 0 refills | Status: AC

## 2024-10-14 MED ORDER — COVID-19 MRNA VAC-TRIS(PFIZER) 30 MCG/0.3ML IM SUSY: 0.3000 mL | PREFILLED_SYRINGE | Freq: Once | INTRAMUSCULAR | 0 refills | Status: AC

## 2024-10-14 NOTE — Progress Notes (Signed)
 Subjective:  Patient ID: Dustin Mckee, male    DOB: 1956/03/26  Age: 68 y.o. MRN: [id number]  CC: Hypertension (Patient states that when he goes to his appointment his BP is normally higher than normal but when he takes it at home its fine. He is currently on 2 different BP medications and wants to know if he needs to change his medication to get this under control. ) and Hyperlipidemia   HPI Dustin Mckee presents for f/up    Discussed the use of AI scribe software for clinical note transcription with the patient, who gave verbal consent to proceed.  History of Present Illness Dustin Mckee is a 68 year old male with hypertension who presents for a follow-up regarding his blood pressure management.  His blood pressure readings at home are typically normal, around 120 mmHg, although he does not take them regularly. During doctor's visits, his blood pressure tends to be higher, around 150 mmHg. He takes his blood pressure medication at 7 AM, and his appointments are usually in the morning.  He has been actively working on losing weight and has lost approximately 15 pounds intentionally. He was taking Qsymia , prescribed by his endocrinologist, but has not taken it for about a month as he is waiting for a refill. He aims to lose an additional 10 to 15 pounds to improve his health.  No dizziness, lightheadedness, chest pain, shortness of breath, swelling in his legs or feet, changes in urine flow, or blood sugar symptoms such as excessive thirst or urination. He feels healthier since losing weight.   Outpatient Medications Prior to Visit  Medication Sig Dispense Refill   amLODipine  (NORVASC ) 5 MG tablet TAKE 1 TABLET (5 MG TOTAL) BY MOUTH DAILY. 90 tablet 1   triamterene -hydrochlorothiazide  (DYAZIDE) 37.5-25 MG capsule Take 1 each (1 capsule total) by mouth daily. Take 1 Each (1 capsule total) by mouth daily. Patient to follow up with PCP prior to future refills 90 capsule 0    Phentermine -Topiramate  ER (QSYMIA ) 7.5-46 MG CP24 Take 1 tablet daily, start after completion of 14 days of 3.75 mg /23 mg dose (Patient not taking: Reported on 10/14/2024) 30 capsule 4   Testosterone  (ANDROGEL ) 20.25 MG/1.25GM (1.62%) GEL Use 1 pump on each shoulder daily in the morning. (Patient not taking: Reported on 10/14/2024) 75 g 1   Multiple Vitamins-Minerals (MENS 50+ ADVANCED PO) Take by mouth.     rosuvastatin  (CRESTOR ) 20 MG tablet Take 1 tablet (20 mg total) by mouth daily. Please schedule an appt with PCP for further refills 30 tablet 0   No facility-administered medications prior to visit.    ROS Review of Systems  Constitutional:  Negative for appetite change, chills, diaphoresis, fatigue and fever.  HENT: Negative.    Eyes: Negative.   Respiratory:  Negative for cough, chest tightness, shortness of breath and wheezing.   Cardiovascular:  Negative for chest pain and leg swelling.  Gastrointestinal: Negative.  Negative for abdominal pain, constipation, diarrhea, nausea and vomiting.  Endocrine: Negative.   Genitourinary: Negative.  Negative for difficulty urinating.  Musculoskeletal: Negative.  Negative for arthralgias and myalgias.  Skin:  Negative for color change, pallor and rash.  Neurological:  Negative for dizziness and weakness.  Hematological:  Negative for adenopathy. Does not bruise/bleed easily.  Psychiatric/Behavioral: Negative.      Objective:  BP (!) 144/86 (BP Location: Left Arm, Patient Position: Sitting, Cuff Size: Normal)   Pulse 79   Temp 98 F (36.7 C) (  Oral)   Resp 16   Ht 6' 3 (1.905 m)   Wt 252 lb 9.6 oz (114.6 kg)   SpO2 98%   BMI 31.57 kg/m   BP Readings from Last 3 Encounters:  10/14/24 (!) 144/86  09/27/24 128/62  07/14/24 (!) 140/83    Wt Readings from Last 3 Encounters:  10/14/24 252 lb 9.6 oz (114.6 kg)  09/27/24 250 lb (113.4 kg)  07/14/24 266 lb (120.7 kg)    Physical Exam Vitals reviewed.  Constitutional:       Appearance: Normal appearance.  HENT:     Nose: Nose normal.     Mouth/Throat:     Mouth: Mucous membranes are moist.  Eyes:     General: No scleral icterus.    Conjunctiva/sclera: Conjunctivae normal.  Cardiovascular:     Rate and Rhythm: Normal rate and regular rhythm.     Pulses: Normal pulses.     Heart sounds: No murmur heard.    No friction rub. No gallop.     Comments: EKG --- NSR, 76 bpm No LVH, Q waves, or ST/T wave changes  Pulmonary:     Effort: Pulmonary effort is normal.     Breath sounds: No stridor. No wheezing, rhonchi or rales.  Abdominal:     General: Abdomen is flat.     Palpations: There is no mass.     Tenderness: There is no abdominal tenderness. There is no guarding.     Hernia: No hernia is present.  Musculoskeletal:        General: Normal range of motion.     Cervical back: Neck supple.     Right lower leg: No edema.     Left lower leg: No edema.  Skin:    General: Skin is warm and dry.  Neurological:     General: No focal deficit present.     Mental Status: He is alert. Mental status is at baseline.  Psychiatric:        Mood and Affect: Mood normal.        Behavior: Behavior normal.     Lab Results  Component Value Date   WBC 5.4 10/14/2024   HGB 16.1 10/14/2024   HCT 47.9 10/14/2024   PLT 243.0 10/14/2024   GLUCOSE 99 10/14/2024   CHOL 268 (H) 10/14/2024   TRIG 142.0 10/14/2024   HDL 51.90 10/14/2024   LDLCALC 188 (H) 10/14/2024   ALT 19 10/14/2024   AST 15 10/14/2024   NA 141 10/14/2024   K 3.9 10/14/2024   CL 100 10/14/2024   CREATININE 1.15 10/14/2024   BUN 15 10/14/2024   CO2 29 10/14/2024   TSH 2.71 10/14/2024   PSA 3.87 10/14/2024   HGBA1C 5.6 10/14/2024    DG Cervical Spine 2 or 3 views Result Date: 04/23/2024 CLINICAL DATA:  Neck pain EXAM: CERVICAL SPINE - 2-3 VIEW COMPARISON:  04/17/2016 FINDINGS: Straightening of the cervical spine. Suboptimal visualization of cervicothoracic junction. Mild to moderate disc space  narrowing C5-C6 and C6-C7 with mild disc space narrowing at C3-C4. Dens and lateral masses are within normal limits. IMPRESSION: Straightening of the cervical spine with mild to moderate degenerative changes, most pronounced at C5-C6 and C6-C7. Electronically Signed   By: Luke Bun M.D.   On: 04/23/2024 23:05    Assessment & Plan:  Primary hypertension- It sounds like there is white coat element to the HTN. EKG is negative for LVH. Will discontinue dyazide and increase the amlodipine  dose. -  TSH; Future -     Urinalysis, Routine w reflex microscopic; Future -     Basic metabolic panel with GFR; Future -     CBC with Differential/Platelet; Future -     COVID-19 mRNA Vac-TriS(Pfizer); Inject 0.3 mLs into the muscle once for 1 dose.  Dispense: 0.3 mL; Refill: 0 -     EKG 12-Lead -     amLODIPine  Besylate; Take 1 tablet (10 mg total) by mouth daily.  Dispense: 90 tablet; Refill: 0 -     AMB Referral [facility] Care Management  Benign prostatic hyperplasia with urinary frequency -     PSA; Future -     Urinalysis, Routine w reflex microscopic; Future  Dyslipidemia, goal LDL below 100- He has not achieve his LDL goal. Will restart the statin. -     Lipid panel; Future -     TSH; Future -     Hepatic function panel; Future -     AMB Referral [facility] Care Management -     Rosuvastatin  Calcium ; Take 1 tablet (20 mg total) by mouth daily.  Dispense: 90 tablet; Refill: 10  Rising PSA level- PSA is not rising. -     PSA; Future  Prediabetes -     Hemoglobin A1c; Future -     Basic metabolic panel with GFR; Future  Gastroesophageal reflux disease without esophagitis -     CBC with Differential/Platelet; Future  Need for immunization against influenza -     Flu vaccine HIGH DOSE PF(Fluzone Trivalent)  Polyp of colon, unspecified part of colon, unspecified type -     Ambulatory referral to Gastroenterology     Follow-up: Return in about 3 months (around 01/12/2025).  Debby Molt, MD

## 2024-10-14 NOTE — Patient Instructions (Signed)
 Hypertension, Adult High blood pressure (hypertension) is when the force of blood pumping through the arteries is too strong. The arteries are the blood vessels that carry blood from the heart throughout the body. Hypertension forces the heart to work harder to pump blood and may cause arteries to become narrow or stiff. Untreated or uncontrolled hypertension can lead to a heart attack, heart failure, a stroke, kidney disease, and other problems. A blood pressure reading consists of a higher number over a lower number. Ideally, your blood pressure should be below 120/80. The first ("top") number is called the systolic pressure. It is a measure of the pressure in your arteries as your heart beats. The second ("bottom") number is called the diastolic pressure. It is a measure of the pressure in your arteries as the heart relaxes. What are the causes? The exact cause of this condition is not known. There are some conditions that result in high blood pressure. What increases the risk? Certain factors may make you more likely to develop high blood pressure. Some of these risk factors are under your control, including: Smoking. Not getting enough exercise or physical activity. Being overweight. Having too much fat, sugar, calories, or salt (sodium) in your diet. Drinking too much alcohol. Other risk factors include: Having a personal history of heart disease, diabetes, high cholesterol, or kidney disease. Stress. Having a family history of high blood pressure and high cholesterol. Having obstructive sleep apnea. Age. The risk increases with age. What are the signs or symptoms? High blood pressure may not cause symptoms. Very high blood pressure (hypertensive crisis) may cause: Headache. Fast or irregular heartbeats (palpitations). Shortness of breath. Nosebleed. Nausea and vomiting. Vision changes. Severe chest pain, dizziness, and seizures. How is this diagnosed? This condition is diagnosed by  measuring your blood pressure while you are seated, with your arm resting on a flat surface, your legs uncrossed, and your feet flat on the floor. The cuff of the blood pressure monitor will be placed directly against the skin of your upper arm at the level of your heart. Blood pressure should be measured at least twice using the same arm. Certain conditions can cause a difference in blood pressure between your right and left arms. If you have a high blood pressure reading during one visit or you have normal blood pressure with other risk factors, you may be asked to: Return on a different day to have your blood pressure checked again. Monitor your blood pressure at home for 1 week or longer. If you are diagnosed with hypertension, you may have other blood or imaging tests to help your health care provider understand your overall risk for other conditions. How is this treated? This condition is treated by making healthy lifestyle changes, such as eating healthy foods, exercising more, and reducing your alcohol intake. You may be referred for counseling on a healthy diet and physical activity. Your health care provider may prescribe medicine if lifestyle changes are not enough to get your blood pressure under control and if: Your systolic blood pressure is above 130. Your diastolic blood pressure is above 80. Your personal target blood pressure may vary depending on your medical conditions, your age, and other factors. Follow these instructions at home: Eating and drinking  Eat a diet that is high in fiber and potassium, and low in sodium, added sugar, and fat. An example of this eating plan is called the DASH diet. DASH stands for Dietary Approaches to Stop Hypertension. To eat this way: Eat  plenty of fresh fruits and vegetables. Try to fill one half of your plate at each meal with fruits and vegetables. Eat whole grains, such as whole-wheat pasta, brown rice, or whole-grain bread. Fill about one  fourth of your plate with whole grains. Eat or drink low-fat dairy products, such as skim milk or low-fat yogurt. Avoid fatty cuts of meat, processed or cured meats, and poultry with skin. Fill about one fourth of your plate with lean proteins, such as fish, chicken without skin, beans, eggs, or tofu. Avoid pre-made and processed foods. These tend to be higher in sodium, added sugar, and fat. Reduce your daily sodium intake. Many people with hypertension should eat less than 1,500 mg of sodium a day. Do not drink alcohol if: Your health care provider tells you not to drink. You are pregnant, may be pregnant, or are planning to become pregnant. If you drink alcohol: Limit how much you have to: 0-1 drink a day for women. 0-2 drinks a day for men. Know how much alcohol is in your drink. In the [place] one drink equals one 12 oz bottle of beer (355 mL), one 5 oz glass of wine (148 mL), or one 1 oz glass of hard liquor (44 mL). Lifestyle  Work with your health care provider to maintain a healthy body weight or to lose weight. Ask what an ideal weight is for you. Get at least 30 minutes of exercise that causes your heart to beat faster (aerobic exercise) most days of the week. Activities may include walking, swimming, or biking. Include exercise to strengthen your muscles (resistance exercise), such as Pilates or lifting weights, as part of your weekly exercise routine. Try to do these types of exercises for 30 minutes at least 3 days a week. Do not use any products that contain nicotine or tobacco. These products include cigarettes, chewing tobacco, and vaping devices, such as e-cigarettes. If you need help quitting, ask your health care provider. Monitor your blood pressure at home as told by your health care provider. Keep all follow-up visits. This is important. Medicines Take over-the-counter and prescription medicines only as told by your health care provider. Follow directions carefully. Blood  pressure medicines must be taken as prescribed. Do not skip doses of blood pressure medicine. Doing this puts you at risk for problems and can make the medicine less effective. Ask your health care provider about side effects or reactions to medicines that you should watch for. Contact a health care provider if you: Think you are having a reaction to a medicine you are taking. Have headaches that keep coming back (recurring). Feel dizzy. Have swelling in your ankles. Have trouble with your vision. Get help right away if you: Develop a severe headache or confusion. Have unusual weakness or numbness. Feel faint. Have severe pain in your chest or abdomen. Vomit repeatedly. Have trouble breathing. These symptoms may be an emergency. Get help right away. Call 911. Do not wait to see if the symptoms will go away. Do not drive yourself to the hospital. Summary Hypertension is when the force of blood pumping through your arteries is too strong. If this condition is not controlled, it may put you at risk for serious complications. Your personal target blood pressure may vary depending on your medical conditions, your age, and other factors. For most people, a normal blood pressure is less than 120/80. Hypertension is treated with lifestyle changes, medicines, or a combination of both. Lifestyle changes include losing weight, eating a healthy,  low-sodium diet, exercising more, and limiting alcohol. This information is not intended to replace advice given to you by your health care provider. Make sure you discuss any questions you have with your health care provider. Document Revised: 09/04/2021 Document Reviewed: 09/04/2021 Elsevier Patient Education  2024 [employer].

## 2024-10-20 ENCOUNTER — Telehealth: Payer: Self-pay | Admitting: *Deleted

## 2024-10-20 NOTE — Progress Notes (Unsigned)
 Care Guide Pharmacy Note  10/20/2024 Name: HARJIT LEIDER MRN: [id number] DOB: 1956-08-13  Referred By: Joshua Debby CROME, MD Reason for referral: Call Attempt #1 and Complex Care Management (Outreach to schedule referral with pharmacist )   SAVION WASHAM is a 68 y.o. year old male who is a primary care patient of Joshua Debby CROME, MD.  [name] was referred to the pharmacist for assistance related to: HTN and HLD  An unsuccessful telephone outreach was attempted today to contact the patient who was referred to the pharmacy team for assistance with medication management. Additional attempts will be made to contact the patient.  [name], CMA [place]  [facility], [facility] Guide Direct Dial: [phone]  Fax: [phone] Website: [email or web address]

## 2024-10-21 NOTE — Progress Notes (Unsigned)
 Care Guide Pharmacy Note  10/21/2024 Name: Dustin Mckee MRN: [id number] DOB: [birth year]  Referred By: Joshua Debby CROME, MD Reason for referral: Call Attempt #1 and Complex Care Management (Outreach to schedule referral with pharmacist )   Dustin Mckee is a 68 y.o. year old male who is a primary care patient of Joshua Debby CROME, MD.  [name] was referred to the pharmacist for assistance related to: HTN and HLD  A second unsuccessful telephone outreach was attempted today to contact the patient who was referred to the pharmacy team for assistance with medication management. Additional attempts will be made to contact the patient.  [name], CMA [place]  [facility], [facility] Guide Direct Dial: [phone]  Fax: [phone] Website: [email or web address]

## 2024-10-22 ENCOUNTER — Other Ambulatory Visit (HOSPITAL_COMMUNITY)
Admission: RE | Admit: 2024-10-22 | Discharge: 2024-10-22 | Payer: Self-pay | Attending: Medical Genetics | Admitting: Medical Genetics

## 2024-10-22 NOTE — Progress Notes (Signed)
 Care Guide Pharmacy Note  10/22/2024 Name: Dustin Mckee MRN: [id number] DOB: Mar 24, 1956  Referred By: Joshua Debby CROME, MD Reason for referral: Call Attempt #1 and Complex Care Management (Outreach to schedule referral with pharmacist )   Dustin Mckee is a 68 y.o. year old male who is a primary care patient of Joshua Debby CROME, MD.  [name] was referred to the pharmacist for assistance related to: HTN and HLD  A third unsuccessful telephone outreach was attempted today to contact the patient who was referred to the pharmacy team for assistance with medication management. The Population Health team is pleased to engage with this patient at any time in the future upon receipt of referral and should he/she be interested in assistance from the Population Health team.  [name], CMA [facility]  [facility], [facility] Guide Direct Dial: [phone]  Fax: [phone] Website: [email or web address]

## 2024-11-02 LAB — GENECONNECT MOLECULAR SCREEN: Genetic Analysis Overall Interpretation: NEGATIVE

## 2024-11-25 ENCOUNTER — Other Ambulatory Visit: Payer: Self-pay | Admitting: Internal Medicine

## 2024-11-25 DIAGNOSIS — I10 Essential (primary) hypertension: Secondary | ICD-10-CM

## 2025-01-14 ENCOUNTER — Other Ambulatory Visit

## 2025-01-26 ENCOUNTER — Ambulatory Visit: Admitting: Endocrinology

## 2025-07-14 ENCOUNTER — Ambulatory Visit: Admitting: Adult Health
